# Patient Record
Sex: Female | Born: 1955 | Race: Black or African American | Hispanic: No | Smoking: Former smoker
Health system: Southern US, Community
[De-identification: ages and names within clinical notes are randomized; demographics above are authoritative.]

## PROBLEM LIST (undated history)

## (undated) DIAGNOSIS — R918 Other nonspecific abnormal finding of lung field: Secondary | ICD-10-CM

## (undated) DIAGNOSIS — G8929 Other chronic pain: Secondary | ICD-10-CM

## (undated) DIAGNOSIS — I10 Essential (primary) hypertension: Secondary | ICD-10-CM

## (undated) DIAGNOSIS — M549 Dorsalgia, unspecified: Secondary | ICD-10-CM

## (undated) DIAGNOSIS — J449 Chronic obstructive pulmonary disease, unspecified: Secondary | ICD-10-CM

## (undated) DIAGNOSIS — M419 Scoliosis, unspecified: Secondary | ICD-10-CM

## (undated) DIAGNOSIS — J40 Bronchitis, not specified as acute or chronic: Secondary | ICD-10-CM

## (undated) DIAGNOSIS — I4891 Unspecified atrial fibrillation: Secondary | ICD-10-CM

## (undated) DIAGNOSIS — C3490 Malignant neoplasm of unspecified part of unspecified bronchus or lung: Secondary | ICD-10-CM

---

## 2000-05-07 ENCOUNTER — Encounter: Payer: Self-pay | Admitting: Emergency Medicine

## 2000-05-07 ENCOUNTER — Emergency Department (HOSPITAL_COMMUNITY): Admission: EM | Admit: 2000-05-07 | Discharge: 2000-05-07 | Payer: Self-pay | Admitting: Emergency Medicine

## 2002-09-10 ENCOUNTER — Emergency Department (HOSPITAL_COMMUNITY): Admission: EM | Admit: 2002-09-10 | Discharge: 2002-09-10 | Payer: Self-pay | Admitting: Emergency Medicine

## 2005-06-17 ENCOUNTER — Emergency Department (HOSPITAL_COMMUNITY): Admission: EM | Admit: 2005-06-17 | Discharge: 2005-06-17 | Payer: Self-pay | Admitting: Emergency Medicine

## 2007-05-24 ENCOUNTER — Emergency Department (HOSPITAL_COMMUNITY): Admission: EM | Admit: 2007-05-24 | Discharge: 2007-05-24 | Payer: Self-pay | Admitting: Emergency Medicine

## 2012-02-27 ENCOUNTER — Encounter (HOSPITAL_COMMUNITY): Payer: Self-pay | Admitting: Emergency Medicine

## 2012-02-27 ENCOUNTER — Emergency Department (HOSPITAL_COMMUNITY)
Admission: EM | Admit: 2012-02-27 | Discharge: 2012-02-27 | Disposition: A | Discharge status: Home Health | Payer: Self-pay | Attending: Emergency Medicine | Admitting: Emergency Medicine

## 2012-02-27 DIAGNOSIS — M4180 Other forms of scoliosis, site unspecified: Secondary | ICD-10-CM | POA: Insufficient documentation

## 2012-02-27 DIAGNOSIS — G8929 Other chronic pain: Secondary | ICD-10-CM | POA: Insufficient documentation

## 2012-02-27 DIAGNOSIS — M549 Dorsalgia, unspecified: Secondary | ICD-10-CM | POA: Insufficient documentation

## 2012-02-27 DIAGNOSIS — M419 Scoliosis, unspecified: Secondary | ICD-10-CM

## 2012-02-27 HISTORY — DX: Dorsalgia, unspecified: M54.9

## 2012-02-27 HISTORY — DX: Other chronic pain: G89.29

## 2012-02-27 MED ORDER — HYDROCODONE-ACETAMINOPHEN 5-325 MG PO TABS: 1.0000 | ORAL_TABLET | ORAL | Status: DC | PRN

## 2012-02-27 NOTE — ED Notes (Signed)
Per EMS: pt c/o lower back pain chronic in nature that is worse today; pt denies obvious injury
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
## 2012-02-27 NOTE — Discharge Instructions (Signed)
Back Exercises Back exercises help treat and prevent back injuries. The goal of back exercises is to increase the strength of your abdominal and back muscles and the flexibility of your back. These exercises should be started when you no longer have back pain. Back exercises include:  Pelvic Tilt. Lie on your back with your knees bent. Tilt your pelvis until the lower part of your back is against the floor. Hold this position 5 to 10 sec and repeat 5 to 10 times.  Knee to Chest. Pull first 1 knee up against your chest and hold for 20 to 30 seconds, repeat this with the other knee, and then both knees. This may be done with the other leg straight or bent, whichever feels better.  Sit-Ups or Curl-Ups. Bend your knees 90 degrees. Start with tilting your pelvis, and do a partial, slow sit-up, lifting your trunk only 30 to 45 degrees off the floor. Take at least 2 to 3 seconds for each sit-up. Do not do sit-ups with your knees out straight. If partial sit-ups are difficult, simply do the above but with only tightening your abdominal muscles and holding it as directed.  Hip-Lift. Lie on your back with your knees flexed 90 degrees. Push down with your feet and shoulders as you raise your hips a couple inches off the floor; hold for 10 seconds, repeat 5 to 10 times.  Back arches. Lie on your stomach, propping yourself up on bent elbows. Slowly press on your hands, causing an arch in your low back. Repeat 3 to 5 times. Any initial stiffness and discomfort should lessen with repetition over time.  Shoulder-Lifts. Lie face down with arms beside your body. Keep hips and torso pressed to floor as you slowly lift your head and shoulders off the floor. Do not overdo your exercises, especially in the beginning. Exercises may cause you some mild back discomfort which lasts for a few minutes; however, if the pain is more severe, or lasts for more than 15 minutes, do not continue exercises until you see your caregiver.  Improvement with exercise therapy for back problems is slow.  See your caregivers for assistance with developing a proper back exercise program. Document Released: 06/13/2004 Document Revised: 07/29/2011 Document Reviewed: 03/07/2011 [facility] Patient Information 2013 [place], [place]. Back Pain, Adult Back pain is very common. The pain often gets better over time. The cause of back pain is usually not dangerous. Most people can learn to manage their back pain on their own.  HOME CARE   Stay active. Start with short walks on flat ground if you can. Try to walk farther each day.  Do not sit, drive, or stand in one place for more than 30 minutes. Do not stay in bed.  Do not avoid exercise or work. Activity can help your back heal faster.  Be careful when you bend or lift an object. Bend at your knees, keep the object close to you, and do not twist.  Sleep on a firm mattress. Lie on your side, and bend your knees. If you lie on your back, put a pillow under your knees.  Only take medicines as told by your doctor.  Put ice on the injured area.  Put ice in a plastic bag.  Place a towel between your skin and the bag.  Leave the ice on for 15 to 20 minutes, 3 to 4 times a day for the first 2 to 3 days. After that, you can switch between ice and heat packs.  Ask your doctor about back exercises or massage.  Avoid feeling anxious or stressed. Find good ways to deal with stress, such as exercise. GET HELP RIGHT AWAY IF:   Your pain does not go away with rest or medicine.  Your pain does not go away in 1 week.  You have new problems.  You do not feel well.  The pain spreads into your legs.  You cannot control when you poop (bowel movement) or pee (urinate).  Your arms or legs feel weak or lose feeling (numbness).  You feel sick to your stomach (nauseous) or throw up (vomit).  You have belly (abdominal) pain.  You feel like you may pass out (faint). MAKE SURE YOU:   Understand  these instructions.  Will watch your condition.  Will get help right away if you are not doing well or get worse. Document Released: 10/23/2007 Document Revised: 07/29/2011 Document Reviewed: 09/24/2010 [facility] Patient Information 2013 [place], [place].

## 2012-11-22 ENCOUNTER — Emergency Department (HOSPITAL_COMMUNITY)
Admission: EM | Admit: 2012-11-22 | Discharge: 2012-11-22 | Disposition: A | Discharge status: Home / Self Care | Payer: Self-pay | Attending: Emergency Medicine | Admitting: Emergency Medicine

## 2012-11-22 DIAGNOSIS — M79609 Pain in unspecified limb: Secondary | ICD-10-CM | POA: Insufficient documentation

## 2012-11-22 DIAGNOSIS — M5432 Sciatica, left side: Secondary | ICD-10-CM

## 2012-11-22 DIAGNOSIS — M5430 Sciatica, unspecified side: Secondary | ICD-10-CM | POA: Insufficient documentation

## 2012-11-22 DIAGNOSIS — G8929 Other chronic pain: Secondary | ICD-10-CM | POA: Insufficient documentation

## 2012-11-22 DIAGNOSIS — M25559 Pain in unspecified hip: Secondary | ICD-10-CM | POA: Insufficient documentation

## 2012-11-22 MED ORDER — OXYCODONE-ACETAMINOPHEN 5-325 MG PO TABS
1.0000 | ORAL_TABLET | Freq: Once | ORAL | Status: AC
  Administered 2012-11-22: 1 via ORAL
  Filled 2012-11-22: qty 1

## 2012-11-22 MED ORDER — PREDNISONE 20 MG PO TABS
60.0000 mg | ORAL_TABLET | Freq: Once | ORAL | Status: AC
  Administered 2012-11-22: 60 mg via ORAL
  Filled 2012-11-22: qty 3

## 2012-11-22 MED ORDER — HYDROCODONE-ACETAMINOPHEN 5-325 MG PO TABS: 1.0000 | ORAL_TABLET | Freq: Four times a day (QID) | ORAL | Status: DC | PRN

## 2012-11-22 MED ORDER — PREDNISONE 10 MG PO TABS: 20.0000 mg | ORAL_TABLET | Freq: Every day | ORAL | Status: DC

## 2012-11-22 NOTE — ED Provider Notes (Signed)
Medical screening examination/treatment/procedure(s) were performed by non-physician practitioner and as supervising physician I was immediately available for consultation/collaboration.  Ethelda Chick, MD 11/22/12 2215

## 2012-11-22 NOTE — ED Notes (Signed)
Pt has a ride home.  

## 2012-11-22 NOTE — ED Notes (Signed)
Pt BIB EMS. Pt c/o pain in lower back that radiates down to feet. Pt states she has pain when standing and walking and symptoms are worse at night. Pt describes pain in feet as like pins and needles. Pt denies injury to feet or back. Pt ambulatory to exam room from ambulance.

## 2012-11-22 NOTE — ED Provider Notes (Signed)
History  This chart was scribed for [facility] Brittanee Ghazarian - PA by Manuela Schwartz, ED scribe. This patient was seen in room WTR5/WTR5 and the patient's care was started at 2107.  CSN: [id number] Arrival date & time 11/22/12  2056  First MD Initiated Contact with Patient 11/22/12 2107     Chief Complaint  Patient presents with  . Back Pain  . Foot Pain   Patient is a 57 y.o. female presenting with back pain and lower extremity pain. The history is provided by the patient. No language interpreter was used.  Back Pain Location:  Lumbar spine Quality:  Aching Radiates to:  R posterior upper leg and R thigh Pain severity:  Moderate Pain is:  Same all the time Onset quality:  Gradual Timing:  Constant Progression:  Unchanged Chronicity:  Recurrent Context: not falling, not recent illness and not recent injury   Relieved by:  Nothing Worsened by:  Ambulation Ineffective treatments:  None tried Associated symptoms: no abdominal pain, no bladder incontinence, no bowel incontinence, no chest pain, no fever and no weakness   Foot Pain Pertinent negatives include no chest pain, no abdominal pain and no shortness of breath.  Foot Pain Pertinent negatives include no abdominal pain, chest pain, chills, congestion, coughing, fever, nausea, vomiting or weakness.   HPI Comments: Sonya [name] is a 57 y.o. female who presents to the Emergency Department complaining of constant lower lumbar back pain which radiates to her left leg. She states has previously been to the ER for previous similar episodes and referred to specialist but never went. She states was previously tx w/pain medicine which she states temporarily improved her back pain. She denies any recent injuries or trauma. She states her back pain radiates down her entire left thigh/leg. She states her sx are worse w/ambulation. She denies fever/chills, incontinence. She has no medical hx.  She has no PCP  Past Medical History  Diagnosis Date   . Chronic back pain    No past surgical history on file. No family history on file. History  Substance Use Topics  . Smoking status: Not on file  . Smokeless tobacco: Not on file  . Alcohol Use:    OB History   Grav Para Term Preterm Abortions TAB SAB Ect Mult Living                 Review of Systems  Constitutional: Negative for fever and chills.  HENT: Negative for congestion and rhinorrhea.   Respiratory: Negative for cough and shortness of breath.   Cardiovascular: Negative for chest pain.  Gastrointestinal: Negative for nausea, vomiting, abdominal pain and bowel incontinence.  Genitourinary: Negative for bladder incontinence.  Musculoskeletal: Positive for back pain (lumbar back pain).  Skin: Negative for color change and pallor.  Neurological: Negative for weakness.  All other systems reviewed and are negative.   A complete 10 system review of systems was obtained and all systems are negative except as noted in the HPI and PMH.   Allergies  Review of patient's allergies indicates no known allergies.  Home Medications   Current Outpatient Rx  Name  Route  Sig  Dispense  Refill  . HYDROcodone-acetaminophen (NORCO/VICODIN) 5-325 MG per tablet   Oral   Take 1 tablet by mouth every 4 (four) hours as needed for pain.   30 tablet   0   . HYDROcodone-acetaminophen (NORCO/VICODIN) 5-325 MG per tablet   Oral   Take 1-2 tablets by mouth every 6 (six) hours as needed  for pain.   20 tablet   0   . ibuprofen (ADVIL,MOTRIN) 200 MG tablet   Oral   Take 200 mg by mouth every 8 (eight) hours as needed. For pain         . predniSONE (DELTASONE) 10 MG tablet   Oral   Take 2 tablets (20 mg total) by mouth daily.   21 tablet   0     Prednisone dose pack directions:   6 tabs on day ...    Triage Vitals: BP 183/88  Pulse 71  Temp(Src) 98.3 F (36.8 C)  Resp 16  SpO2 100% Physical Exam  Nursing note and vitals reviewed. Constitutional: She is oriented to  person, place, and time. She appears well-developed and well-nourished. No distress.  HENT:  Head: Normocephalic and atraumatic.  Eyes: EOM are normal.  Neck: Neck supple. No tracheal deviation present.  Cardiovascular: Normal rate.   Pulmonary/Chest: Effort normal. No respiratory distress.  Musculoskeletal:       Left hip: She exhibits decreased range of motion, decreased strength and tenderness. She exhibits no bony tenderness, no swelling, no crepitus, no deformity and no laceration.       Legs:  Equal strength to bilateral lower extremities. Neurosensory function adequate to both legs. Skin color is normal. Skin is warm and moist. I see no step off deformity, no bony tenderness. Pt is able to ambulate without limp. Pain is relieved when sitting in certain positions. ROM is decreased due to pain. No crepitus, laceration, effusion, swelling.  Pulses are normal   Neurological: She is alert and oriented to person, place, and time.  Skin: Skin is warm and dry.  Psychiatric: She has a normal mood and affect. Her behavior is normal.    ED Course  Procedures (including critical care time) DIAGNOSTIC STUDIES: Oxygen Saturation is 100% on room air, normal by my interpretation.    COORDINATION OF CARE: At 950 PM Discussed treatment plan with patient which includes pain medicine. Patient agrees.   Labs Reviewed - No data to display No results found. 1. Sciatica of left side     MDM  .Patient with back pain. No neurological deficits. Patient is ambulatory. No warning symptoms of back pain including: loss of bowel or bladder control, night sweats, waking from sleep with back pain, unexplained fevers or weight loss, h/o cancer, IVDU, recent significant  trauma. No concern for cauda equina, epidural abscess, or other serious cause of back pain. Conservative measures such as rest, ice/heat, prednisone and pain medicine indicated with PCP follow-up if no improvement with conservative management.       Dorthula Matas, PA-C 11/22/12 2211

## 2015-06-13 ENCOUNTER — Emergency Department (HOSPITAL_COMMUNITY): Payer: Self-pay

## 2015-06-13 ENCOUNTER — Emergency Department (HOSPITAL_COMMUNITY)
Admission: EM | Admit: 2015-06-13 | Discharge: 2015-06-13 | Disposition: A | Discharge status: Home / Self Care | Payer: Self-pay | Attending: Emergency Medicine | Admitting: Emergency Medicine

## 2015-06-13 ENCOUNTER — Encounter (HOSPITAL_COMMUNITY): Payer: Self-pay

## 2015-06-13 DIAGNOSIS — Z79899 Other long term (current) drug therapy: Secondary | ICD-10-CM | POA: Insufficient documentation

## 2015-06-13 DIAGNOSIS — G8929 Other chronic pain: Secondary | ICD-10-CM | POA: Insufficient documentation

## 2015-06-13 DIAGNOSIS — I10 Essential (primary) hypertension: Secondary | ICD-10-CM | POA: Insufficient documentation

## 2015-06-13 DIAGNOSIS — Z7952 Long term (current) use of systemic steroids: Secondary | ICD-10-CM | POA: Insufficient documentation

## 2015-06-13 DIAGNOSIS — F17200 Nicotine dependence, unspecified, uncomplicated: Secondary | ICD-10-CM | POA: Insufficient documentation

## 2015-06-13 LAB — COMPREHENSIVE METABOLIC PANEL
ALBUMIN: 3.9 g/dL (ref 3.5–5.0)
ALT: 14 U/L (ref 14–54)
ANION GAP: 11 (ref 5–15)
AST: 22 U/L (ref 15–41)
Alkaline Phosphatase: 83 U/L (ref 38–126)
BILIRUBIN TOTAL: 0.4 mg/dL (ref 0.3–1.2)
BUN: 12 mg/dL (ref 6–20)
CO2: 25 mmol/L (ref 22–32)
Calcium: 9.9 mg/dL (ref 8.9–10.3)
Chloride: 102 mmol/L (ref 101–111)
Creatinine, Ser: 0.8 mg/dL (ref 0.44–1.00)
GLUCOSE: 108 mg/dL — AB (ref 65–99)
POTASSIUM: 4.5 mmol/L (ref 3.5–5.1)
Sodium: 138 mmol/L (ref 135–145)
TOTAL PROTEIN: 8.2 g/dL — AB (ref 6.5–8.1)

## 2015-06-13 LAB — I-STAT CHEM 8, ED
BUN: 15 mg/dL (ref 6–20)
Calcium, Ion: 1.23 mmol/L (ref 1.12–1.23)
Chloride: 102 mmol/L (ref 101–111)
Creatinine, Ser: 0.7 mg/dL (ref 0.44–1.00)
Glucose, Bld: 105 mg/dL — ABNORMAL HIGH (ref 65–99)
HEMATOCRIT: 41 % (ref 36.0–46.0)
HEMOGLOBIN: 13.9 g/dL (ref 12.0–15.0)
Potassium: 4.9 mmol/L (ref 3.5–5.1)
SODIUM: 138 mmol/L (ref 135–145)
TCO2: 26 mmol/L (ref 0–100)

## 2015-06-13 LAB — APTT: aPTT: 27 seconds (ref 24–37)

## 2015-06-13 LAB — DIFFERENTIAL
Basophils Absolute: 0 10*3/uL (ref 0.0–0.1)
Basophils Relative: 1 %
EOS ABS: 0.2 10*3/uL (ref 0.0–0.7)
EOS PCT: 3 %
LYMPHS ABS: 1.9 10*3/uL (ref 0.7–4.0)
Lymphocytes Relative: 38 %
Monocytes Absolute: 0.4 10*3/uL (ref 0.1–1.0)
Monocytes Relative: 8 %
NEUTROS PCT: 50 %
Neutro Abs: 2.4 10*3/uL (ref 1.7–7.7)

## 2015-06-13 LAB — CBC
HCT: 36 % (ref 36.0–46.0)
HEMOGLOBIN: 12.4 g/dL (ref 12.0–15.0)
MCH: 33.2 pg (ref 26.0–34.0)
MCHC: 34.4 g/dL (ref 30.0–36.0)
MCV: 96.3 fL (ref 78.0–100.0)
Platelets: 287 10*3/uL (ref 150–400)
RBC: 3.74 MIL/uL — ABNORMAL LOW (ref 3.87–5.11)
RDW: 14.5 % (ref 11.5–15.5)
WBC: 4.9 10*3/uL (ref 4.0–10.5)

## 2015-06-13 LAB — I-STAT TROPONIN, ED: TROPONIN I, POC: 0.01 ng/mL (ref 0.00–0.08)

## 2015-06-13 LAB — PROTIME-INR
INR: 1.09 (ref 0.00–1.49)
PROTHROMBIN TIME: 14.3 s (ref 11.6–15.2)

## 2015-06-13 NOTE — ED Provider Notes (Signed)
CSN: [id number]     Arrival date & time 06/13/15  1430 History   First MD Initiated Contact with Patient 06/13/15 2053     Chief Complaint  Patient presents with  . Dizziness  . Hypertension     (Consider location/radiation/quality/duration/timing/severity/associated sxs/prior Treatment) HPI Sonya [name] Is a 60 year old female who presents emergency Department with chief complaint of hypertension. She states she has no previous history of hypertension. She is a half pack smoker a day. She states that she goes to [facility] for sleep medications. 2 days ago she noticed that she felt "swimmy headed all day." When she went to [facility] yesterday. She was told that her blood pressure was extremely high and needs to be evaluated. Upon arrival in the emergency department. Her blood pressure was found to be 207/94 and has since calmed down on its own to 189/86. The patient denies chest pain, shortness of breath, vertigo, unilateral weakness, difficulty with speech or swallowing. She denies drug abuse. Past Medical History  Diagnosis Date  . Chronic back pain    No past surgical history on file. No family history on file. Social History  Substance Use Topics  . Smoking status: Current Every Day Smoker -- 0.25 packs/day  . Smokeless tobacco: Not on file  . Alcohol Use: Yes     Comment: occ    OB History    No data available     Review of Systems  Ten systems reviewed and are negative for acute change, except as noted in the HPI.    Allergies  Review of patient's allergies indicates no known allergies.  Home Medications   Prior to Admission medications   Medication Sig Start Date End Date Taking? Authorizing Provider  Brexpiprazole (REXULTI) 0.5 MG TABS Take 1 tablet by mouth.   Yes Historical Provider, MD  ibuprofen (ADVIL,MOTRIN) 200 MG tablet Take 200 mg by mouth every 8 (eight) hours as needed. For pain   Yes Historical Provider, MD  HYDROcodone-acetaminophen  (NORCO/VICODIN) 5-325 MG per tablet Take 1 tablet by mouth every 4 (four) hours as needed for pain. 02/27/12   Daleen Bo, MD  HYDROcodone-acetaminophen (NORCO/VICODIN) 5-325 MG per tablet Take 1-2 tablets by mouth every 6 (six) hours as needed for pain. 11/22/12   Tiffany Carlota Raspberry, PA-C  predniSONE (DELTASONE) 10 MG tablet Take 2 tablets (20 mg total) by mouth daily. 11/22/12   Tiffany Carlota Raspberry, PA-C   BP 189/86 mmHg  Pulse 81  Temp(Src) 98.2 F (36.8 C) (Oral)  Resp 16  SpO2 100% Physical Exam  Constitutional: She is oriented to person, place, and time. She appears well-developed and well-nourished. No distress.  HENT:  Head: Normocephalic and atraumatic.  Eyes: Conjunctivae are normal. No scleral icterus.  Neck: Normal range of motion.  Cardiovascular: Normal rate, regular rhythm and normal heart sounds.  Exam reveals no gallop and no friction rub.   No murmur heard. Pulmonary/Chest: Effort normal and breath sounds normal. No respiratory distress.  Abdominal: Soft. Bowel sounds are normal. She exhibits no distension and no mass. There is no tenderness. There is no guarding.  Neurological: She is alert and oriented to person, place, and time.  Speech is clear and goal oriented, follows commands Major Cranial nerves without deficit, no facial droop Normal strength in upper and lower extremities bilaterally including dorsiflexion and plantar flexion, strong and equal grip strength Sensation normal to light and sharp touch Moves extremities without ataxia, coordination intact Normal finger to nose and rapid alternating movements Neg romberg, no pronator  drift Normal gait Normal heel-shin and balance   Skin: Skin is warm and dry. She is not diaphoretic.    ED Course  Procedures (including critical care time) Labs Review Labs Reviewed  CBC - Abnormal; Notable for the following:    RBC 3.74 (*)    All other components within normal limits  COMPREHENSIVE METABOLIC PANEL - Abnormal;  Notable for the following:    Glucose, Bld 108 (*)    Total Protein 8.2 (*)    All other components within normal limits  I-STAT CHEM 8, ED - Abnormal; Notable for the following:    Glucose, Bld 105 (*)    All other components within normal limits  PROTIME-INR  APTT  DIFFERENTIAL  I-STAT TROPOININ, ED  CBG MONITORING, ED    Imaging Review Ct Head Wo Contrast  06/13/2015  CLINICAL DATA:  Dizziness, elevated blood pressure EXAM: CT HEAD WITHOUT CONTRAST TECHNIQUE: Contiguous axial images were obtained from the base of the skull through the vertex without intravenous contrast. COMPARISON:  None. FINDINGS: No skull fracture is noted. Paranasal sinuses and mastoid air cells are unremarkable. No intracranial hemorrhage, mass effect or midline shift No acute cortical infarction. No mass lesion is noted on this unenhanced scan. The gray and white-matter differentiation is preserved. IMPRESSION: No acute intracranial abnormality. Electronically Signed   By: Lahoma Crocker M.D.   On: 06/13/2015 16:48   I have personally reviewed and evaluated these images and lab results as part of my medical decision-making.   EKG Interpretation None      MDM   Final diagnoses:  Essential hypertension    Patient noted to be hypertensive in the emergency department.  No signs of hypertensive urgency.  Discussed with patient the need for close follow-up and management by their primary care physician.  Given appt with [facility].    Margarita Mail, PA-C 06/13/15 2146  Charlesetta Shanks, MD 06/14/15 [phone]

## 2015-06-13 NOTE — ED Notes (Signed)
Pt reports BP elevated, was seen at [facility] yesterday who advised pt to come to ED for BP medication.   Not diagnosed BP or been on any BP meds.  Pt having dizziness.  No other s/s noted.  NAD at triage/

## 2015-06-13 NOTE — Discharge Instructions (Signed)
Hypertension °Hypertension, commonly called high blood pressure, is when the force of blood pumping through your arteries is too strong. Your arteries are the blood vessels that carry blood from your heart throughout your body. A blood pressure reading consists of a higher number over a lower number, such as 110/72. The higher number (systolic) is the pressure inside your arteries when your heart pumps. The lower number (diastolic) is the pressure inside your arteries when your heart relaxes. Ideally you want your blood pressure below 120/80. °Hypertension forces your heart to work harder to pump blood. Your arteries may become narrow or stiff. Having untreated or uncontrolled hypertension can cause heart attack, stroke, kidney disease, and other problems. °RISK FACTORS °Some risk factors for high blood pressure are controllable. Others are not.  °Risk factors you cannot control include:  °· Race. You may be at higher risk if you are African American. °· Age. Risk increases with age. °· Gender. Men are at higher risk than women before age 45 years. After age 65, women are at higher risk than men. °Risk factors you can control include: °· Not getting enough exercise or physical activity. °· Being overweight. °· Getting too much fat, sugar, calories, or salt in your diet. °· Drinking too much alcohol. °SIGNS AND SYMPTOMS °Hypertension does not usually cause signs or symptoms. Extremely high blood pressure (hypertensive crisis) may cause headache, anxiety, shortness of breath, and nosebleed. °DIAGNOSIS °To check if you have hypertension, your health care provider will measure your blood pressure while you are seated, with your arm held at the level of your heart. It should be measured at least twice using the same arm. Certain conditions can cause a difference in blood pressure between your right and left arms. A blood pressure reading that is higher than normal on one occasion does not mean that you need treatment. If  it is not clear whether you have high blood pressure, you may be asked to return on a different day to have your blood pressure checked again. Or, you may be asked to monitor your blood pressure at home for 1 or more weeks. °TREATMENT °Treating high blood pressure includes making lifestyle changes and possibly taking medicine. Living a healthy lifestyle can help lower high blood pressure. You may need to change some of your habits. °Lifestyle changes may include: °· Following the DASH diet. This diet is high in fruits, vegetables, and whole grains. It is low in salt, red meat, and added sugars. °· Keep your sodium intake below 2,300 mg per day. °· Getting at least 30-45 minutes of aerobic exercise at least 4 times per week. °· Losing weight if necessary. °· Not smoking. °· Limiting alcoholic beverages. °· Learning ways to reduce stress. °Your health care provider may prescribe medicine if lifestyle changes are not enough to get your blood pressure under control, and if one of the following is true: °· You are 18-59 years of age and your systolic blood pressure is above 140. °· You are 60 years of age or older, and your systolic blood pressure is above 150. °· Your diastolic blood pressure is above 90. °· You have diabetes, and your systolic blood pressure is over 140 or your diastolic blood pressure is over 90. °· You have kidney disease and your blood pressure is above 140/90. °· You have heart disease and your blood pressure is above 140/90. °Your personal target blood pressure may vary depending on your medical conditions, your age, and other factors. °HOME CARE INSTRUCTIONS °·   Have your blood pressure rechecked as directed by your health care provider.   Take medicines only as directed by your health care provider. Follow the directions carefully. Blood pressure medicines must be taken as prescribed. The medicine does not work as well when you skip doses. Skipping doses also puts you at risk for  problems.  Do not smoke.   Monitor your blood pressure at home as directed by your health care provider. SEEK MEDICAL CARE IF:   You think you are having a reaction to medicines taken.  You have recurrent headaches or feel dizzy.  You have swelling in your ankles.  You have trouble with your vision. SEEK IMMEDIATE MEDICAL CARE IF:  You develop a severe headache or confusion.  You have unusual weakness, numbness, or feel faint.  You have severe chest or abdominal pain.  You vomit repeatedly.  You have trouble breathing. MAKE SURE YOU:   Understand these instructions.  Will watch your condition.  Will get help right away if you are not doing well or get worse.   This information is not intended to replace advice given to you by your health care provider. Make sure you discuss any questions you have with your health care provider.   Document Released: 05/06/2005 Document Revised: 09/20/2014 Document Reviewed: 02/26/2013 Elsevier Interactive Patient Education 2016 [place] DASH stands for "Dietary Approaches to Stop Hypertension." The DASH eating plan is a healthy eating plan that has been shown to reduce high blood pressure (hypertension). Additional health benefits may include reducing the risk of type 2 diabetes mellitus, heart disease, and stroke. The DASH eating plan may also help with weight loss. WHAT DO I NEED TO KNOW ABOUT THE DASH EATING PLAN? For the DASH eating plan, you will follow these general guidelines:  Choose foods with a percent daily value for sodium of less than 5% (as listed on the food label).  Use salt-free seasonings or herbs instead of table salt or sea salt.  Check with your health care provider or pharmacist before using salt substitutes.  Eat lower-sodium products, often labeled as "lower sodium" or "no salt added."  Eat fresh foods.  Eat more vegetables, fruits, and low-fat dairy products.  Choose whole  grains. Look for the word "whole" as the first word in the ingredient list.  Choose fish and skinless chicken or [place] more often than red meat. Limit fish, poultry, and meat to 6 oz (170 g) each day.  Limit sweets, desserts, sugars, and sugary drinks.  Choose heart-healthy fats.  Limit cheese to 1 oz (28 g) per day.  Eat more home-cooked food and less restaurant, buffet, and fast food.  Limit fried foods.  Cook foods using methods other than frying.  Limit canned vegetables. If you do use them, rinse them well to decrease the sodium.  When eating at a restaurant, ask that your food be prepared with less salt, or no salt if possible. WHAT FOODS CAN I EAT? Seek help from a dietitian for individual calorie needs. Grains Whole grain or whole wheat bread. Brown rice. Whole grain or whole wheat pasta. Quinoa, bulgur, and whole grain cereals. Low-sodium cereals. Corn or whole wheat flour tortillas. Whole grain cornbread. Whole grain crackers. Low-sodium crackers. Vegetables Fresh or frozen vegetables (raw, steamed, roasted, or grilled). Low-sodium or reduced-sodium tomato and vegetable juices. Low-sodium or reduced-sodium tomato sauce and paste. Low-sodium or reduced-sodium canned vegetables.  Fruits All fresh, canned (in natural juice), or frozen fruits. Meat and  Other Protein Products Ground beef (85% or leaner), grass-fed beef, or beef trimmed of fat. Skinless chicken or [place]. Ground chicken or [place]. Pork trimmed of fat. All fish and seafood. Eggs. Dried beans, peas, or lentils. Unsalted nuts and seeds. Unsalted canned beans. Dairy Low-fat dairy products, such as skim or 1% milk, 2% or reduced-fat cheeses, low-fat ricotta or cottage cheese, or plain low-fat yogurt. Low-sodium or reduced-sodium cheeses. Fats and Oils Tub margarines without trans fats. Light or reduced-fat mayonnaise and salad dressings (reduced sodium). Avocado. Safflower, olive, or canola oils. Natural peanut or  almond butter. Other Unsalted popcorn and pretzels. The items listed above may not be a complete list of recommended foods or beverages. Contact your dietitian for more options. WHAT FOODS ARE NOT RECOMMENDED? Grains White bread. White pasta. White rice. Refined cornbread. Bagels and croissants. Crackers that contain trans fat. Vegetables Creamed or fried vegetables. Vegetables in a cheese sauce. Regular canned vegetables. Regular canned tomato sauce and paste. Regular tomato and vegetable juices. Fruits Dried fruits. Canned fruit in light or heavy syrup. Fruit juice. Meat and Other Protein Products Fatty cuts of meat. Ribs, chicken wings, bacon, sausage, bologna, salami, chitterlings, fatback, hot dogs, bratwurst, and packaged luncheon meats. Salted nuts and seeds. Canned beans with salt. Dairy Whole or 2% milk, cream, half-and-half, and cream cheese. Whole-fat or sweetened yogurt. Full-fat cheeses or blue cheese. Nondairy creamers and whipped toppings. Processed cheese, cheese spreads, or cheese curds. Condiments Onion and garlic salt, seasoned salt, table salt, and sea salt. Canned and packaged gravies. Worcestershire sauce. Tartar sauce. Barbecue sauce. Teriyaki sauce. Soy sauce, including reduced sodium. Steak sauce. Fish sauce. Oyster sauce. Cocktail sauce. Horseradish. Ketchup and mustard. Meat flavorings and tenderizers. Bouillon cubes. Hot sauce. Tabasco sauce. Marinades. Taco seasonings. Relishes. Fats and Oils Butter, stick margarine, lard, shortening, ghee, and bacon fat. Coconut, palm kernel, or palm oils. Regular salad dressings. Other Pickles and olives. Salted popcorn and pretzels. The items listed above may not be a complete list of foods and beverages to avoid. Contact your dietitian for more information. WHERE CAN I FIND MORE INFORMATION? National Heart, Lung, and Blood Institute: travelstabloid.com   This information is not intended to  replace advice given to you by your health care provider. Make sure you discuss any questions you have with your health care provider.   Document Released: 04/25/2011 Document Revised: 05/27/2014 Document Reviewed: 03/10/2013 Elsevier Interactive Patient Education [employer].

## 2015-06-13 NOTE — Care Management (Signed)
Patient presents to [facility] ED sent from [facility] with elevated B/P. Patient without health insurance or a PCP. On ED evaluation patient pressure was confirmed elevated, patient needing OP follow-up, CM arranged for follow- up at the [facility] for Monday 1/30 at 3:45p with Dr. Jarold Song.  Patient made aware of follow up appointment, and information placed on AVS. No further ED CM needs identified.

## 2015-06-13 NOTE — ED Notes (Signed)
Patient verbalized understanding of discharge instructions and denies any further needs or questions at this time. VS stable. Patient ambulatory with steady gait.  

## 2015-06-19 ENCOUNTER — Inpatient Hospital Stay: Payer: Self-pay | Admitting: Family Medicine

## 2015-06-20 ENCOUNTER — Encounter: Payer: Self-pay | Admitting: Family Medicine

## 2015-06-20 ENCOUNTER — Ambulatory Visit: Payer: Self-pay | Attending: Family Medicine | Admitting: Family Medicine

## 2015-06-20 VITALS — BP 196/87 | HR 78 | Temp 97.9°F | Resp 16 | Ht 69.0 in | Wt 128.0 lb

## 2015-06-20 DIAGNOSIS — F17210 Nicotine dependence, cigarettes, uncomplicated: Secondary | ICD-10-CM | POA: Insufficient documentation

## 2015-06-20 DIAGNOSIS — Z720 Tobacco use: Secondary | ICD-10-CM

## 2015-06-20 DIAGNOSIS — I10 Essential (primary) hypertension: Secondary | ICD-10-CM | POA: Insufficient documentation

## 2015-06-20 DIAGNOSIS — Z79899 Other long term (current) drug therapy: Secondary | ICD-10-CM | POA: Insufficient documentation

## 2015-06-20 DIAGNOSIS — R42 Dizziness and giddiness: Secondary | ICD-10-CM | POA: Insufficient documentation

## 2015-06-20 MED ORDER — CLONIDINE HCL 0.1 MG PO TABS
0.2000 mg | ORAL_TABLET | Freq: Once | ORAL | Status: AC
  Administered 2015-06-20: 0.2 mg via ORAL

## 2015-06-20 MED ORDER — LISINOPRIL-HYDROCHLOROTHIAZIDE 20-25 MG PO TABS: 1.0000 | ORAL_TABLET | Freq: Every day | ORAL | Status: DC

## 2015-06-20 MED FILL — LISINOPRIL-HCTZ 20-25 MG TA: 20-25 | 30 days supply | Qty: 30 | Fill #0

## 2015-06-20 NOTE — Progress Notes (Signed)
Patient's here for hospital f/up for HTN. Patient reports feeling lightheaded rated at 8/10, since her d/c form the hospital.  Patient declines A1c screening, and flu vaccine.  Patient said that during ED visit her BP was constantly irregular.

## 2015-06-20 NOTE — Patient Instructions (Signed)
Hypertension Hypertension, commonly called high blood pressure, is when the force of blood pumping through your arteries is too strong. Your arteries are the blood vessels that carry blood from your heart throughout your body. A blood pressure reading consists of a higher number over a lower number, such as 110/72. The higher number (systolic) is the pressure inside your arteries when your heart pumps. The lower number (diastolic) is the pressure inside your arteries when your heart relaxes. Ideally you want your blood pressure below 120/80. Hypertension forces your heart to work harder to pump blood. Your arteries may become narrow or stiff. Having untreated or uncontrolled hypertension can cause heart attack, stroke, kidney disease, and other problems. RISK FACTORS Some risk factors for high blood pressure are controllable. Others are not.  Risk factors you cannot control include:   Race. You may be at higher risk if you are African American.  Age. Risk increases with age.  Gender. Men are at higher risk than women before age 45 years. After age 65, women are at higher risk than men. Risk factors you can control include:  Not getting enough exercise or physical activity.  Being overweight.  Getting too much fat, sugar, calories, or salt in your diet.  Drinking too much alcohol. SIGNS AND SYMPTOMS Hypertension does not usually cause signs or symptoms. Extremely high blood pressure (hypertensive crisis) may cause headache, anxiety, shortness of breath, and nosebleed. DIAGNOSIS To check if you have hypertension, your health care provider will measure your blood pressure while you are seated, with your arm held at the level of your heart. It should be measured at least twice using the same arm. Certain conditions can cause a difference in blood pressure between your right and left arms. A blood pressure reading that is higher than normal on one occasion does not mean that you need treatment. If  it is not clear whether you have high blood pressure, you may be asked to return on a different day to have your blood pressure checked again. Or, you may be asked to monitor your blood pressure at home for 1 or more weeks. TREATMENT Treating high blood pressure includes making lifestyle changes and possibly taking medicine. Living a healthy lifestyle can help lower high blood pressure. You may need to change some of your habits. Lifestyle changes may include:  Following the DASH diet. This diet is high in fruits, vegetables, and whole grains. It is low in salt, red meat, and added sugars.  Keep your sodium intake below 2,300 mg per day.  Getting at least 30-45 minutes of aerobic exercise at least 4 times per week.  Losing weight if necessary.  Not smoking.  Limiting alcoholic beverages.  Learning ways to reduce stress. Your health care provider may prescribe medicine if lifestyle changes are not enough to get your blood pressure under control, and if one of the following is true:  You are 18-59 years of age and your systolic blood pressure is above 140.  You are 60 years of age or older, and your systolic blood pressure is above 150.  Your diastolic blood pressure is above 90.  You have diabetes, and your systolic blood pressure is over 140 or your diastolic blood pressure is over 90.  You have kidney disease and your blood pressure is above 140/90.  You have heart disease and your blood pressure is above 140/90. Your personal target blood pressure may vary depending on your medical conditions, your age, and other factors. HOME CARE INSTRUCTIONS    Have your blood pressure rechecked as directed by your health care provider.   Take medicines only as directed by your health care provider. Follow the directions carefully. Blood pressure medicines must be taken as prescribed. The medicine does not work as well when you skip doses. Skipping doses also puts you at risk for  problems.  Do not smoke.   Monitor your blood pressure at home as directed by your health care provider. SEEK MEDICAL CARE IF:   You think you are having a reaction to medicines taken.  You have recurrent headaches or feel dizzy.  You have swelling in your ankles.  You have trouble with your vision. SEEK IMMEDIATE MEDICAL CARE IF:  You develop a severe headache or confusion.  You have unusual weakness, numbness, or feel faint.  You have severe chest or abdominal pain.  You vomit repeatedly.  You have trouble breathing. MAKE SURE YOU:   Understand these instructions.  Will watch your condition.  Will get help right away if you are not doing well or get worse.   This information is not intended to replace advice given to you by your health care provider. Make sure you discuss any questions you have with your health care provider.   Document Released: 05/06/2005 Document Revised: 09/20/2014 Document Reviewed: 02/26/2013 Elsevier Interactive Patient Education 2016 Elsevier Inc.  

## 2015-06-20 NOTE — Progress Notes (Signed)
Subjective:  Patient ID: Sonya [name], female    DOB: 08-02-1955  Age: 60 y.o. MRN: [id number]  CC: Hypertension and Dizziness   HPI Ralphine Hinks presents for  Follow-up on hypertension. She was recently seen at [facility] ED on 06/13/15 and was found to have an elevated blood pressure of  207/95 which later trended down to 189/86; she was discharged without antihypertensives and advised follow-up here in the clinic.  Blood pressure today is 207/87 and the patient admits to feeling lightheaded but denies chest pains, headaches or blurry vision.  She has not been under the care of any  Primary care physician. She goes to [facility] where she receives Rexulti for "sleep" she tells me. She smokes 6-7 sticks of cigarettes per day.  Outpatient Prescriptions Prior to Visit  Medication Sig Dispense Refill  . Brexpiprazole (REXULTI) 0.5 MG TABS Take 1 tablet by mouth.    Marland Kitchen ibuprofen (ADVIL,MOTRIN) 200 MG tablet Take 200 mg by mouth every 8 (eight) hours as needed. For pain    . HYDROcodone-acetaminophen (NORCO/VICODIN) 5-325 MG per tablet Take 1 tablet by mouth every 4 (four) hours as needed for pain. (Patient not taking: Reported on 06/20/2015) 30 tablet 0  . HYDROcodone-acetaminophen (NORCO/VICODIN) 5-325 MG per tablet Take 1-2 tablets by mouth every 6 (six) hours as needed for pain. (Patient not taking: Reported on 06/20/2015) 20 tablet 0  . predniSONE (DELTASONE) 10 MG tablet Take 2 tablets (20 mg total) by mouth daily. (Patient not taking: Reported on 06/20/2015) 21 tablet 0   No facility-administered medications prior to visit.    ROS Review of Systems General: negative for fever, weight loss, appetite change Eyes: no visual symptoms. ENT: no ear symptoms, no sinus tenderness, no nasal congestion or sore throat. Neck: no pain  Respiratory: no wheezing, shortness of breath, cough Cardiovascular: no chest pain, no dyspnea on exertion, no pedal edema, no  orthopnea. Gastrointestinal: no abdominal pain, no diarrhea, no constipation Genito-Urinary: no urinary frequency, no dysuria, no polyuria. Hematologic: no bruising Endocrine: no cold or heat intolerance Neurological: no headaches, no seizures, no tremors,  Positive for lightheadedness Musculoskeletal: no joint pains, no joint swelling Skin: no pruritus, no rash. Psychological: no depression, no anxiety,   Objective: Filed Vitals:   06/20/15 1516 06/20/15 1519  BP: 207/87 196/87  Pulse: 78   Temp: 97.9 F (36.6 C)   TempSrc: Oral   Resp: 16   Height: '5\' 9"'$  (1.753 m)   Weight: 128 lb (58.06 kg)   SpO2: 100%      BP 196/87 mmHg  Pulse 78  Temp(Src) 97.9 F (36.6 C) (Oral)  Resp 16  Ht '5\' 9"'$  (1.753 m)  Wt 128 lb (58.06 kg)  BMI 18.89 kg/m2  SpO2 100%  BP/Weight 06/20/2015 11/03/735 1/0/6269  Systolic BP 485 462 703  Diastolic BP 87 96 88  Wt. (Lbs) 128 - -  BMI 18.89 - -    CMP Latest Ref Rng 06/13/2015 06/13/2015  Glucose 65 - 99 mg/dL 105(H) 108(H)  BUN 6 - 20 mg/dL 15 12  Creatinine 0.44 - 1.00 mg/dL 0.70 0.80  Sodium 135 - 145 mmol/L 138 138  Potassium 3.5 - 5.1 mmol/L 4.9 4.5  Chloride 101 - 111 mmol/L 102 102  CO2 22 - 32 mmol/L - 25  Calcium 8.9 - 10.3 mg/dL - 9.9  Total Protein 6.5 - 8.1 g/dL - 8.2(H)  Total Bilirubin 0.3 - 1.2 mg/dL - 0.4  Alkaline Phos 38 - 126 U/L - 83  AST 15 - 41 U/L - 22  ALT 14 - 54 U/L - 14      Physical Exam Constitutional: normal appearing,  Neck: normal range of motion, no thyromegaly, no JVD Cardiovascular: normal rate and rhythm, normal heart sounds, no murmurs, rub or gallop, no pedal edema Respiratory: clear to auscultation bilaterally, no wheezes, no rales, no rhonchi Abdomen: soft, not tender to palpation, normal bowel sounds, no enlarged organs Extremities: Full ROM, no tenderness in joints Skin: warm and dry, no lesions. Neurological: alert, oriented x3, cranial nerves I-XII grossly intact , normal motor  strength, normal sensation. Psychological: normal mood.   Assessment & Plan:   1. Accelerated hypertension  clonidine given in the clinic and patient observed for 30 minutes prior to discharge.  we'll reassess at next visit for improvement and will add second antihypertensive if needed  -low-sodium, DASH diet. - lisinopril-hydrochlorothiazide (PRINZIDE,ZESTORETIC) 20-25 MG tablet; Take 1 tablet by mouth daily.  Dispense: 30 tablet; Refill: 3 - cloNIDine (CATAPRES) tablet 0.2 mg; Take 2 tablets (0.2 mg total) by mouth once.  2. Tobacco abuse  spent 3 minutes discussing smoking cessation and patient is willing to work on quitting   Meds ordered this encounter  Medications  . lisinopril-hydrochlorothiazide (PRINZIDE,ZESTORETIC) 20-25 MG tablet    Sig: Take 1 tablet by mouth daily.    Dispense:  30 tablet    Refill:  3  . cloNIDine (CATAPRES) tablet 0.2 mg    Sig:     Follow-up: Return in about 1 week (around 06/27/2015) for  follow-up on hypertension.   Arnoldo Morale MD

## 2015-06-30 ENCOUNTER — Ambulatory Visit: Payer: Self-pay | Admitting: Family Medicine

## 2017-12-04 ENCOUNTER — Encounter (HOSPITAL_COMMUNITY): Payer: Self-pay | Admitting: Emergency Medicine

## 2017-12-04 ENCOUNTER — Emergency Department (HOSPITAL_COMMUNITY)
Admission: EM | Admit: 2017-12-04 | Discharge: 2017-12-04 | Disposition: A | Discharge status: Home / Self Care | Payer: Medicaid Other | Attending: Emergency Medicine | Admitting: Emergency Medicine

## 2017-12-04 ENCOUNTER — Emergency Department (HOSPITAL_COMMUNITY): Payer: Medicaid Other

## 2017-12-04 DIAGNOSIS — I10 Essential (primary) hypertension: Secondary | ICD-10-CM | POA: Diagnosis not present

## 2017-12-04 DIAGNOSIS — M419 Scoliosis, unspecified: Secondary | ICD-10-CM | POA: Insufficient documentation

## 2017-12-04 DIAGNOSIS — F17210 Nicotine dependence, cigarettes, uncomplicated: Secondary | ICD-10-CM | POA: Insufficient documentation

## 2017-12-04 DIAGNOSIS — M549 Dorsalgia, unspecified: Secondary | ICD-10-CM | POA: Diagnosis present

## 2017-12-04 DIAGNOSIS — R918 Other nonspecific abnormal finding of lung field: Secondary | ICD-10-CM | POA: Insufficient documentation

## 2017-12-04 HISTORY — DX: Bronchitis, not specified as acute or chronic: J40

## 2017-12-04 HISTORY — DX: Scoliosis, unspecified: M41.9

## 2017-12-04 LAB — TROPONIN I

## 2017-12-04 LAB — CBC WITH DIFFERENTIAL/PLATELET
BASOS ABS: 0 10*3/uL (ref 0.0–0.1)
Basophils Relative: 0 %
EOS PCT: 1 %
Eosinophils Absolute: 0.1 10*3/uL (ref 0.0–0.7)
HEMATOCRIT: 29.1 % — AB (ref 36.0–46.0)
Hemoglobin: 10 g/dL — ABNORMAL LOW (ref 12.0–15.0)
LYMPHS PCT: 17 %
Lymphs Abs: 1.6 10*3/uL (ref 0.7–4.0)
MCH: 33.1 pg (ref 26.0–34.0)
MCHC: 34.4 g/dL (ref 30.0–36.0)
MCV: 96.4 fL (ref 78.0–100.0)
MONO ABS: 0.8 10*3/uL (ref 0.1–1.0)
Monocytes Relative: 8 %
Neutro Abs: 7.4 10*3/uL (ref 1.7–7.7)
Neutrophils Relative %: 74 %
PLATELETS: 419 10*3/uL — AB (ref 150–400)
RBC: 3.02 MIL/uL — ABNORMAL LOW (ref 3.87–5.11)
RDW: 13.4 % (ref 11.5–15.5)
WBC: 9.9 10*3/uL (ref 4.0–10.5)

## 2017-12-04 LAB — COMPREHENSIVE METABOLIC PANEL
ALBUMIN: 3.2 g/dL — AB (ref 3.5–5.0)
ALT: 9 U/L (ref 0–44)
AST: 14 U/L — AB (ref 15–41)
Alkaline Phosphatase: 71 U/L (ref 38–126)
Anion gap: 12 (ref 5–15)
BUN: 11 mg/dL (ref 8–23)
CHLORIDE: 100 mmol/L (ref 98–111)
CO2: 26 mmol/L (ref 22–32)
Calcium: 9.5 mg/dL (ref 8.9–10.3)
Creatinine, Ser: 0.65 mg/dL (ref 0.44–1.00)
GFR calc Af Amer: >60 mL/min (ref >60)
GFR calc non Af Amer: >60 mL/min (ref >60)
GLUCOSE: 93 mg/dL (ref 70–99)
POTASSIUM: 3.6 mmol/L (ref 3.5–5.1)
SODIUM: 138 mmol/L (ref 135–145)
Total Bilirubin: 0.6 mg/dL (ref 0.3–1.2)
Total Protein: 8.5 g/dL — ABNORMAL HIGH (ref 6.5–8.1)

## 2017-12-04 MED ORDER — IOHEXOL 300 MG/ML  SOLN
75.0000 mL | Freq: Once | INTRAMUSCULAR | Status: AC | PRN
  Administered 2017-12-04: 75 mL via INTRAVENOUS

## 2017-12-04 MED ORDER — ACETAMINOPHEN 325 MG PO TABS
650.0000 mg | ORAL_TABLET | Freq: Once | ORAL | Status: AC
  Administered 2017-12-04: 650 mg via ORAL
  Filled 2017-12-04: qty 2

## 2017-12-04 MED ORDER — OXYCODONE-ACETAMINOPHEN 5-325 MG PO TABS: 1.0000 | ORAL_TABLET | Freq: Three times a day (TID) | ORAL | 0 refills | Status: AC | PRN

## 2017-12-04 NOTE — ED Triage Notes (Signed)
Per EMS-complaining of right upper back pain radiating to right shoulder for 3 days-history of scoliosis-

## 2017-12-04 NOTE — ED Provider Notes (Signed)
[facility] DEPT Provider Note   CSN: [id number] Arrival date & time: 12/04/17  1151     History   Chief Complaint Chief Complaint  Patient presents with  . Back Pain    HPI Sonya [name] is a 62 y.o. female a history of scoliosis, hypertension, and tobacco use disorder who presents to the emergency department with back pain.  The patient endorses constant midline upper back pain that radiates to the right shoulder and into her right ribs that began 3 days ago.  She characterizes the pain as sharp. She took 200 mg of ibuprofen 2 nights ago and a second dose of 200 mg of ibuprofen last night with minimal improvement of her pain.  She reports that she began to feel short of breath today, but states "I think it is my bronchitis."  Pain is worse with coughing and alleviated by nothing.  She has not been seen by primary care since 2017 when she was placed on lisinopril and clonidine for hypertension.  She states that she has not taken the medication "in as long as I can remember".  She denies fever, chills, palpitations, leg swelling, abdominal pain, nausea, vomiting, diarrhea, previous falls or injuries.  She is a current, every day smoker.  The history is provided by the patient. No language interpreter was used.    Past Medical History:  Diagnosis Date  . Bronchitis   . Chronic back pain   . Scoliosis     Patient Active Problem List   Diagnosis Date Noted  . Accelerated hypertension 06/20/2015  . Tobacco abuse 06/20/2015  . Chronic back pain     History reviewed. No pertinent surgical history.   OB History   None      Home Medications    Prior to Admission medications   Medication Sig Start Date End Date Taking? Authorizing Provider  ibuprofen (ADVIL,MOTRIN) 800 MG tablet Take 800 mg by mouth every 8 (eight) hours as needed for moderate pain.   Yes [provider]  oxyCODONE-acetaminophen (PERCOCET/ROXICET) 5-325 MG  tablet Take 1 tablet by mouth every 8 (eight) hours as needed for severe pain. 12/04/17   Zitlali Primm A, PA-C    Family History No family history on file.  Social History Social History   Tobacco Use  . Smoking status: Current Every Day Smoker    Packs/day: 0.25  Substance Use Topics  . Alcohol use: Yes    Comment: occ   . Drug use: No     Allergies   Patient has no known allergies.   Review of Systems Review of Systems  Constitutional: Negative for activity change, chills and fever.  HENT: Negative for congestion and sore throat.   Respiratory: Positive for cough and shortness of breath.   Cardiovascular: Positive for chest pain.  Gastrointestinal: Negative for abdominal pain.  Genitourinary: Negative for dysuria.  Musculoskeletal: Positive for arthralgias, back pain and myalgias.  Skin: Negative for rash.  Allergic/Immunologic: Negative for immunocompromised state.  Neurological: Negative for headaches.  Psychiatric/Behavioral: Negative for confusion.   Physical Exam Updated Vital Signs BP (!) 150/75 (BP Location: Left Arm)   Pulse (!) 116   Temp 99.2 F (37.3 C) (Oral)   Resp (!) 23   SpO2 96%   Physical Exam  Constitutional: No distress.  HENT:  Head: Normocephalic.  Eyes: Conjunctivae are normal.  Neck: Neck supple.  Cardiovascular: Regular rhythm, normal heart sounds and intact distal pulses. Tachycardia present. Exam reveals no gallop and no  friction rub.  No murmur heard. Pulses:      Radial pulses are 2+ on the right side, and 2+ on the left side.       Dorsalis pedis pulses are 2+ on the right side, and 2+ on the left side.  Pulmonary/Chest: Effort normal. No stridor. No respiratory distress. She has no wheezes. She has no rales.  Abdominal: Soft. She exhibits no distension and no mass. There is no tenderness. There is no rebound and no guarding. No hernia.  Musculoskeletal:  Reproducible TTP to the mid-line thoracic spine.  She is diffusely  tender to palpation from the spinous processes and throughout the right thoracic back, and right shoulder.  Radial pulses are 2+ and symmetric.  Sensation is intact and equal throughout the bilateral upper and lower extremities.  Good strength against resistance of the bilateral upper and lower extremities.  Capillary refill is less than 2 seconds.  No overlying erythema, edema, warmth, or rash.  No tenderness to the cervical or lumbar spinous processes or bilateral paraspinal muscles.  Neurological: She is alert.  Skin: Skin is warm. No rash noted.  Psychiatric: Her behavior is normal.  Nursing note and vitals reviewed.  ED Treatments / Results  Labs (all labs ordered are listed, but only abnormal results are displayed) Labs Reviewed  CBC WITH DIFFERENTIAL/PLATELET - Abnormal; Notable for the following components:      Result Value   RBC 3.02 (*)    Hemoglobin 10.0 (*)    HCT 29.1 (*)    Platelets 419 (*)    All other components within normal limits  COMPREHENSIVE METABOLIC PANEL - Abnormal; Notable for the following components:   Total Protein 8.5 (*)    Albumin 3.2 (*)    AST 14 (*)    All other components within normal limits  TROPONIN I    EKG EKG Interpretation  Date/Time:  Thursday December 04 2017 15:13:53 EDT Ventricular Rate:  95 PR Interval:    QRS Duration: 99 QT Interval:  365 QTC Calculation: 459 R Axis:   -3 Text Interpretation:  Sinus rhythm Ventricular premature complex Right atrial enlargement Abnormal R-wave progression, early transition No significant change was found Confirmed by Jola Schmidt [phone]) on 12/04/2017 3:20:18 PM   Radiology Dg Chest 2 View  Result Date: 12/04/2017 CLINICAL DATA:  Right upper back pain radiating to the right shoulder for 3 days. EXAM: CHEST - 2 VIEW COMPARISON:  05/24/2007 FINDINGS: Large opacity occupying the upper right hemithorax. Findings are suggestive for collapse of the right upper lobe but cannot exclude underlying  lesion or mass. In addition, there is concern for some fullness in the left paratracheal region. No large pleural effusions. Evidence for underlying lung hyperinflation. Heart size is normal. IMPRESSION: Large opacity in the right upper chest. Findings are suggestive for collapse of the right upper lobe but cannot exclude an underlying mass or large lesion. There is also suspicious soft tissue fullness on the left side of the mediastinum. Recommend further characterization with a chest CT with IV contrast. Electronically Signed   By: Markus Daft M.D.   On: 12/04/2017 14:36   Dg Thoracic Spine 2 View  Result Date: 12/04/2017 CLINICAL DATA:  Back pain with radiation to right shoulder. History of scoliosis. EXAM: THORACIC SPINE 2 VIEWS COMPARISON:  Chest x-ray 12/04/2017. FINDINGS: Upper thoracic spine scoliosis concave right 11 degrees. Diffuse degenerative change. Diffuse osteopenia. No acute bony abnormality. Atelectasis and consolidation right upper lung. Underlying malignancy cannot be excluded. IMPRESSION:  1.  Upper thoracic spine scoliosis concave right 11 degrees. 2. Atelectasis/consolidation right upper lung. Underlying malignancy cannot be excluded. Electronically Signed   By: Marcello Moores  Register   On: 12/04/2017 14:37   Ct Chest W Contrast  Result Date: 12/04/2017 CLINICAL DATA:  Right upper lung opacity on radiography. Right back and shoulder pain. EXAM: CT CHEST WITH CONTRAST TECHNIQUE: Multidetector CT imaging of the chest was performed during intravenous contrast administration. CONTRAST:  73mL OMNIPAQUE IOHEXOL 300 MG/ML  SOLN COMPARISON:  Radiographs from earlier the same day FINDINGS: Cardiovascular: There is irregular narrowing of the distal SVC, but no significant collateral filling to suggest this is of hemodynamic significance. Heart size remains normal. No pericardial effusion. There is a encasement and occlusion of right upper lobe pulmonary artery branch. Mild atheromatous plaque in the  aortic arch. No dissection or aneurysm. Mediastinum/Nodes: 0.7 cm prevascular mediastinal node. Can't exclude right suprahilar adenopathy confluent with central lung mass and adjacent pulmonary consolidation. Lungs/Pleura: Small layering right pleural effusion without nodularity or enhancement. No pneumothorax. Central right upper lobe mass estimated at least 7.1 cm with cut off of the right upper lobe bronchus and involvement of right upper lobe pulmonary artery branch and SVC, extending to the right hilum. There is consolidation/atelectasis of the entire right lung such as the exact margins of the mass cannot be ascertained. No convincing osseous or chest wall involvement. Right middle and lower lobes are unremarkable. 7 mm nodule in the anterior left upper lobe image 36 and adjacent 5 mm nodule just inferiorly image 40. Subpleural blebs in the left lung apex. Upper Abdomen: No adrenal nodule. Visualized liver unremarkable. No acute findings. Musculoskeletal: No fracture or worrisome bone lesion. 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 IMPRESSION: 1. Large right upper lobe/suprahilar mass involving right upper lobe bronchus, right upper lobe pulmonary artery, and SVC. Probably approachable for bronchoscopic biopsy. 2. Atelectasis/consolidation throughout the remainder of the right upper lobe. 3. Nonspecific left upper lobe subcentimeter pulmonary nodules. No other suggestion of adenopathy or distal metastatic disease. Electronically Signed   By: Lucrezia Europe M.D.   On: 12/04/2017 16:34    Procedures Procedures (including critical care time)  Medications Ordered in ED Medications  acetaminophen (TYLENOL) tablet 650 mg (650 mg Oral Given 12/04/17 1313)  iohexol (OMNIPAQUE) 300 MG/ML solution 75 mL (75 mLs Intravenous Contrast Given 12/04/17 1610)     Initial Impression / Assessment and Plan / ED Course  I have reviewed the triage vital signs and the nursing notes.  Pertinent  labs & imaging results that were available during my care of the patient were reviewed by me and considered in my medical decision making (see chart for details).     62 year old female with a history of hypertension, scoliosis, and tobacco use disorder presenting with mid back pain for 3 days.  Tachycardic in the 110s on arrival.  She is otherwise hemodynamically stable.  She is in no acute distress.  Tylenol given for pain control.  Chest x-ray demonstrating a large opacity in the right upper chest suggestive of collapse of the right upper lobe versus underlying mass versus a large lesion.  There is also suspicious soft tissue fullness on the left side of the mediastinum.  The patient was seen and evaluated along with Dr. Venora Maples, attending physician.  No leukocytosis.  Hemoglobin 10.0, down from 12.42 years ago.  She has a mild thrombocytosis of 419, likely reactive.  Labs are otherwise reassuring.  CT chest with a large right  upper lobe/suprahilar mass involving the right upper lobe bronchus, right upper lobe pulmonary artery, and SVC.  There is atelectasis and consolidation throughout the remainder of the right upper lobe.   These findings were discussed with the patient and we engaged in shared decision making conversation.  She was offered admission for further work-up of her findings on CT, but declined and preferred to follow-up on an outpatient basis with pain control for home.  The patient reports she has an appointment with a new PCP in approximately 1 month.  She does not know the physician's name or the name of the practice, but knows that he is located on [address].  Spoke with Mariann Laster with case management regarding concern for the patient's outpatient follow-up.  Given that the patient has previously been seen by [facility] health and wellness, Mariann Laster has arranged for Opal Sidles, the care coordinator at [facility] and wellness, to call the patient tomorrow to schedule a follow-up appointment with  primary care and biopsy appointment.  The patient's best contact number has been updated in Epic.  All questions have been answered and the patient is agreeable with the plan.  She has been given strict return precautions to the emergency department.  She is hemodynamically stable and in no acute distress.  Will discharge the patient home with an Rx for Percocet for pain control.  A 80-month prescription history query was performed using the [place] CSRS prior to discharge. She is safe for discharge home with outpatient follow-up at this time.  Final Clinical Impressions(s) / ED Diagnoses   Final diagnoses:  Mass of upper lobe of right lung    ED Discharge Orders        Ordered    oxyCODONE-acetaminophen (PERCOCET/ROXICET) 5-325 MG tablet  Every 8 hours PRN     12/04/17 1813       Azjah Pardo, Laymond Purser, PA-C 12/04/17 1839    Jola Schmidt, MD 12/05/17 [phone]

## 2017-12-04 NOTE — Discharge Instructions (Signed)
Thank you for allowing me to care for you today in the Emergency Department.   Please see the information above about receiving a phone call from [facility].  Take 1 tablet of Percocet every 8 hours as needed for severe pain.  You can also take 650 mg of Tylenol every 6 hours for mild to moderate pain.  Do not drive while taking Percocet as it is a controlled substance and can cause you to be impaired.  Return to the emergency department if you develop a high fever, severe shortness of breath, if you pass out, have severe chest pain, or other new, concerning symptoms.

## 2017-12-08 ENCOUNTER — Encounter: Payer: Self-pay | Admitting: Surgery

## 2017-12-08 ENCOUNTER — Telehealth: Payer: Self-pay

## 2017-12-08 NOTE — Telephone Encounter (Signed)
Message received from Laurena Slimmer, RN CM requesting a hospital follow up appointment for the patient.  This CM spoke to patient and scheduled her for an appointment for tomorrow- 12/09/17 @ 1530.  She was very appreciative of the call.   Update provided to Wendi Maya, RN CM

## 2017-12-08 NOTE — Progress Notes (Signed)
ED CM reached out to [facility] CM concerning ED follow up, appointment scheduled 7/23 at the [facility]. Patient updated as per clinic CM.

## 2017-12-09 ENCOUNTER — Ambulatory Visit: Payer: Medicaid Other | Admitting: Family Medicine

## 2017-12-09 ENCOUNTER — Telehealth: Payer: Self-pay
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
## 2017-12-10 ENCOUNTER — Ambulatory Visit: Payer: Medicaid Other | Admitting: Family Medicine

## 2017-12-10 ENCOUNTER — Telehealth: Payer: Self-pay

## 2017-12-10 NOTE — Telephone Encounter (Signed)
Call placed to the patient and informed her that there is an appointment available today @ 1510 and she said that she would take it.  She has the address to the clinic.

## 2018-01-06 ENCOUNTER — Inpatient Hospital Stay: Payer: Medicaid Other | Admitting: Nurse Practitioner

## 2018-03-17 ENCOUNTER — Inpatient Hospital Stay (HOSPITAL_COMMUNITY)
Admission: EM | Admit: 2018-03-17 | Discharge: 2018-03-28 | DRG: 181 | Disposition: A | Discharge status: Home Health | Payer: Medicaid Other | Attending: Internal Medicine | Admitting: Internal Medicine

## 2018-03-17 ENCOUNTER — Encounter (HOSPITAL_COMMUNITY): Payer: Self-pay | Admitting: Family Medicine

## 2018-03-17 ENCOUNTER — Emergency Department (HOSPITAL_COMMUNITY): Payer: Medicaid Other

## 2018-03-17 DIAGNOSIS — E8770 Fluid overload, unspecified: Secondary | ICD-10-CM | POA: Diagnosis not present

## 2018-03-17 DIAGNOSIS — R918 Other nonspecific abnormal finding of lung field: Secondary | ICD-10-CM

## 2018-03-17 DIAGNOSIS — Z9119 Patient's noncompliance with other medical treatment and regimen: Secondary | ICD-10-CM

## 2018-03-17 DIAGNOSIS — M549 Dorsalgia, unspecified: Secondary | ICD-10-CM | POA: Diagnosis present

## 2018-03-17 DIAGNOSIS — I82A13 Acute embolism and thrombosis of axillary vein, bilateral: Secondary | ICD-10-CM | POA: Diagnosis not present

## 2018-03-17 DIAGNOSIS — T380X5A Adverse effect of glucocorticoids and synthetic analogues, initial encounter: Secondary | ICD-10-CM | POA: Diagnosis not present

## 2018-03-17 DIAGNOSIS — T502X5A Adverse effect of carbonic-anhydrase inhibitors, benzothiadiazides and other diuretics, initial encounter: Secondary | ICD-10-CM | POA: Diagnosis not present

## 2018-03-17 DIAGNOSIS — I4891 Unspecified atrial fibrillation: Secondary | ICD-10-CM | POA: Diagnosis not present

## 2018-03-17 DIAGNOSIS — I82A11 Acute embolism and thrombosis of right axillary vein: Secondary | ICD-10-CM

## 2018-03-17 DIAGNOSIS — J95811 Postprocedural pneumothorax: Secondary | ICD-10-CM

## 2018-03-17 DIAGNOSIS — J9811 Atelectasis: Secondary | ICD-10-CM | POA: Diagnosis present

## 2018-03-17 DIAGNOSIS — I160 Hypertensive urgency: Secondary | ICD-10-CM | POA: Diagnosis not present

## 2018-03-17 DIAGNOSIS — I270 Primary pulmonary hypertension: Secondary | ICD-10-CM

## 2018-03-17 DIAGNOSIS — I82A12 Acute embolism and thrombosis of left axillary vein: Secondary | ICD-10-CM

## 2018-03-17 DIAGNOSIS — C3411 Malignant neoplasm of upper lobe, right bronchus or lung: Principal | ICD-10-CM | POA: Diagnosis present

## 2018-03-17 DIAGNOSIS — R64 Cachexia: Secondary | ICD-10-CM | POA: Diagnosis present

## 2018-03-17 DIAGNOSIS — G8929 Other chronic pain: Secondary | ICD-10-CM | POA: Diagnosis present

## 2018-03-17 DIAGNOSIS — I288 Other diseases of pulmonary vessels: Secondary | ICD-10-CM | POA: Diagnosis present

## 2018-03-17 DIAGNOSIS — D6869 Other thrombophilia: Secondary | ICD-10-CM

## 2018-03-17 DIAGNOSIS — D649 Anemia, unspecified: Secondary | ICD-10-CM

## 2018-03-17 DIAGNOSIS — I871 Compression of vein: Secondary | ICD-10-CM | POA: Diagnosis present

## 2018-03-17 DIAGNOSIS — E876 Hypokalemia: Secondary | ICD-10-CM | POA: Diagnosis not present

## 2018-03-17 DIAGNOSIS — D72829 Elevated white blood cell count, unspecified: Secondary | ICD-10-CM | POA: Diagnosis not present

## 2018-03-17 DIAGNOSIS — Z681 Body mass index (BMI) 19 or less, adult: Secondary | ICD-10-CM

## 2018-03-17 DIAGNOSIS — J441 Chronic obstructive pulmonary disease with (acute) exacerbation: Secondary | ICD-10-CM | POA: Diagnosis present

## 2018-03-17 DIAGNOSIS — R000 Tachycardia, unspecified: Secondary | ICD-10-CM | POA: Diagnosis not present

## 2018-03-17 DIAGNOSIS — I82629 Acute embolism and thrombosis of deep veins of unspecified upper extremity: Secondary | ICD-10-CM | POA: Diagnosis present

## 2018-03-17 DIAGNOSIS — F17210 Nicotine dependence, cigarettes, uncomplicated: Secondary | ICD-10-CM | POA: Diagnosis present

## 2018-03-17 DIAGNOSIS — I10 Essential (primary) hypertension: Secondary | ICD-10-CM | POA: Diagnosis present

## 2018-03-17 HISTORY — DX: Chronic obstructive pulmonary disease, unspecified: J44.9

## 2018-03-17 HISTORY — DX: Unspecified atrial fibrillation: I48.91

## 2018-03-17 HISTORY — DX: Other nonspecific abnormal finding of lung field: R91.8

## 2018-03-17 HISTORY — DX: Essential (primary) hypertension: I10

## 2018-03-17 LAB — I-STAT TROPONIN, ED: TROPONIN I, POC: 0 ng/mL (ref 0.00–0.08)

## 2018-03-17 LAB — CBC WITH DIFFERENTIAL/PLATELET
ABS IMMATURE GRANULOCYTES: 0.04 10*3/uL (ref 0.00–0.07)
Basophils Absolute: 0.1 10*3/uL (ref 0.0–0.1)
Basophils Relative: 1 %
EOS PCT: 1 %
Eosinophils Absolute: 0.1 10*3/uL (ref 0.0–0.5)
HEMATOCRIT: 28 % — AB (ref 36.0–46.0)
HEMOGLOBIN: 9 g/dL — AB (ref 12.0–15.0)
Immature Granulocytes: 1 %
LYMPHS ABS: 0.7 10*3/uL (ref 0.7–4.0)
LYMPHS PCT: 9 %
MCH: 30.4 pg (ref 26.0–34.0)
MCHC: 32.1 g/dL (ref 30.0–36.0)
MCV: 94.6 fL (ref 80.0–100.0)
MONO ABS: 0.7 10*3/uL (ref 0.1–1.0)
Monocytes Relative: 10 %
Neutro Abs: 5.8 10*3/uL (ref 1.7–7.7)
Neutrophils Relative %: 78 %
Platelets: 306 10*3/uL (ref 150–400)
RBC: 2.96 MIL/uL — AB (ref 3.87–5.11)
RDW: 13.3 % (ref 11.5–15.5)
WBC: 7.4 10*3/uL (ref 4.0–10.5)
nRBC: 0 % (ref 0.0–0.2)

## 2018-03-17 LAB — PROTIME-INR
INR: 1.16
Prothrombin Time: 14.7 seconds (ref 11.4–15.2)

## 2018-03-17 LAB — COMPREHENSIVE METABOLIC PANEL
ALBUMIN: 2.9 g/dL — AB (ref 3.5–5.0)
ALK PHOS: 69 U/L (ref 38–126)
ALT: 8 U/L (ref 0–44)
AST: 12 U/L — ABNORMAL LOW (ref 15–41)
Anion gap: 8 (ref 5–15)
BUN: 9 mg/dL (ref 8–23)
CALCIUM: 11.4 mg/dL — AB (ref 8.9–10.3)
CO2: 26 mmol/L (ref 22–32)
Chloride: 100 mmol/L (ref 98–111)
Creatinine, Ser: 0.56 mg/dL (ref 0.44–1.00)
GFR calc non Af Amer: >60 mL/min (ref >60)
Glucose, Bld: 107 mg/dL — ABNORMAL HIGH (ref 70–99)
Potassium: 3.8 mmol/L (ref 3.5–5.1)
SODIUM: 134 mmol/L — AB (ref 135–145)
TOTAL PROTEIN: 7.8 g/dL (ref 6.5–8.1)
Total Bilirubin: 0.6 mg/dL (ref 0.3–1.2)

## 2018-03-17 LAB — APTT: APTT: 28 s (ref 24–36)

## 2018-03-17 MED ORDER — DM-GUAIFENESIN ER 30-600 MG PO TB12
1.0000 | ORAL_TABLET | Freq: Two times a day (BID) | ORAL | Status: DC
  Administered 2018-03-17 – 2018-03-28 (×22): 1 via ORAL
  Filled 2018-03-17 (×22): qty 1

## 2018-03-17 MED ORDER — IOPAMIDOL (ISOVUE-370) INJECTION 76%
INTRAVENOUS | Status: AC
  Filled 2018-03-17: qty 100

## 2018-03-17 MED ORDER — SODIUM CHLORIDE 0.9 % IV BOLUS
1000.0000 mL | Freq: Once | INTRAVENOUS | Status: AC
  Administered 2018-03-17: 1000 mL via INTRAVENOUS

## 2018-03-17 MED ORDER — SODIUM CHLORIDE 0.9 % IV SOLN
INTRAVENOUS | Status: DC
  Administered 2018-03-17 – 2018-03-19 (×4): via INTRAVENOUS
  Administered 2018-03-19: 125 mL/h via INTRAVENOUS

## 2018-03-17 MED ORDER — IOPAMIDOL (ISOVUE-370) INJECTION 76%
100.0000 mL | Freq: Once | INTRAVENOUS | Status: AC | PRN
  Administered 2018-03-17: 100 mL via INTRAVENOUS

## 2018-03-17 MED ORDER — HEPARIN (PORCINE) IN NACL 100-0.45 UNIT/ML-% IJ SOLN
1000.0000 [IU]/h | INTRAMUSCULAR | Status: DC
  Administered 2018-03-17: 1000 [IU]/h via INTRAVENOUS
  Filled 2018-03-17 (×2): qty 250

## 2018-03-17 MED ORDER — ADULT MULTIVITAMIN W/MINERALS CH
1.0000 | ORAL_TABLET | Freq: Every day | ORAL | Status: DC
  Administered 2018-03-17 – 2018-03-28 (×12): 1 via ORAL
  Filled 2018-03-17 (×13): qty 1

## 2018-03-17 MED ORDER — SODIUM CHLORIDE 0.9 % IJ SOLN
INTRAMUSCULAR | Status: AC
  Filled 2018-03-17: qty 50

## 2018-03-17 MED ORDER — HEPARIN BOLUS VIA INFUSION
4000.0000 [IU] | Freq: Once | INTRAVENOUS | Status: AC
  Administered 2018-03-17: 4000 [IU] via INTRAVENOUS
  Filled 2018-03-17: qty 4000

## 2018-03-17 NOTE — ED Notes (Addendum)
Pt being transported to the floor

## 2018-03-17 NOTE — ED Triage Notes (Signed)
Patient is from home and transported via [facility]. Patient was seen back in July, 2019 at [facility] for midline upper back pain. Dx with a large right upper lobe/suprhilar mass involving the right upper lobe bronchus, right upper lobe pulmonary artery, and SVC. Today, patient is experiencing shortness of breath for the last two weeks. Also, has a nonproductive cough that is dry. Unable to sleep and eat. Lost a large amount of weight recently.

## 2018-03-17 NOTE — ED Notes (Signed)
Bed: WA22 Expected date:  Expected time:  Means of arrival:  Comments: EMS 

## 2018-03-17 NOTE — ED Notes (Signed)
Attempted to call report to floor. RN will call right back

## 2018-03-17 NOTE — Progress Notes (Addendum)
[facility] for IV heparin Indication: r/o PE  No Known Allergies  Patient Measurements: Height: 5\' 5"  (165.1 cm) Weight: 125 lb (56.7 kg) IBW/kg (Calculated) : 57 Heparin Dosing Weight: TBW  Vital Signs: Temp: 97.8 F (36.6 C) (10/29 1601) Temp Source: Oral (10/29 1601) BP: 147/86 (10/29 1930) Pulse Rate: 92 (10/29 1930)  Labs: Recent Labs    03/17/18 1700  HGB 9.0*  HCT 28.0*  PLT 306  CREATININE 0.56    Estimated Creatinine Clearance: 65.3 mL/min (by C-G formula based on SCr of 0.56 mg/dL).   Medical History: Past Medical History:  Diagnosis Date  . Bronchitis   . Chronic back pain   . Scoliosis     Medications:   (Not in a hospital admission) Scheduled:  . heparin  4,000 Units Intravenous Once  . iopamidol      . sodium chloride       PRN:   Assessment: 89 yoF, current smoker, recent Dx of RUL mass involving SVC 3 months ago but did not follow up. Now with progressive SOB x 2 wks. CTA shows enlarged R lung mass with occlusion of RML pulmonary artery. Oncologist consulted, recommended starting IV heparin to r/o thrombus and IR biopsy tomorrow.    Baseline INR, aPTT: pending  Prior anticoagulation: none  Significant events:  Today, 03/17/2018:  CBC: hgb low but similar to earlier this year; Plt wnl  No bleeding or infusion issues per nursing  CrCl: 65 ml/min  Goal of Therapy: Heparin level 0.3-0.7 units/ml Monitor platelets by anticoagulation protocol: Yes  Plan:  Heparin 4000 unit (70 unit/kg) IV bolus x 1  Heparin 1000 units/hr (80 units/kg/hr) IV infusion  Check heparin level 6 hrs after start  Daily CBC, daily heparin level once stable  Monitor for signs of bleeding or thrombosis  F/u timing for Bx tomorrow; hold per MD instructions   Reuel Boom, PharmD, BCPS [phone] 03/17/2018, 8:16 PM

## 2018-03-17 NOTE — ED Notes (Signed)
Admitting MD at bedside then transporting pt to [id number]

## 2018-03-17 NOTE — H&P (Signed)
History and Physical    Sonya [name] TWS:[id number] DOB: 1956/01/31 DOA: 03/17/2018  PCP: Default, Provider, MD Patient coming from: Home  Chief Complaint: SOB  HPI: Sonya [name] is a 62 y.o. female with medical history significant of right lung mass presenting to the hospital for evaluation of shortness of breath. 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  Patient was offered admission for further work-up at that time but she declined it.  Plan was for her to follow-up with her primary care provider. Patient states she did not follow-up with a doctor since her ED discharge in July.  She continued to smoke cigarettes and quit a week ago; history of smoking 1 pack/day for 37 years.  States that she has been feeling short of breath with exertion and when lying down for the past 2 weeks.  Also having a cough productive of white-colored sputum.  Symptoms have been getting progressively worse.  Denies having any hemoptysis.  She has unintentionally lost 5 to 6 pounds since July.  Denies having any chest pain.  Denies having any hematemesis, melena, or hematochezia.  ED Course: Afebrile.  Tachycardic initially but now pulse normal.  Continues to be tachypneic.  Not hypotensive.  Satting well on room air.  No leukocytosis.  Corrected calcium 12.3.  I-STAT troponin negative.  EKG not suggestive of ACS.  Chest x-ray showing known right suprahilar mass with associated upper lobe atelectasis.  CTA chest showing no pulmonary embolus, however, increased vascular invasion and/or mass-effect by the right suprahilar mass, resulting in new occlusion of the right middle lobe pulmonary artery and persistent right upper lobe pulmonary artery occlusion as well as occlusion/invasion of the superior vena cava.  Debris within the right mainstem  bronchus.  Right upper lobe atelectasis.  CT abdomen pelvis showing no acute abnormality.  ED physician discussed the case with Dr. Jana Hakim from oncology who recommended starting the patient on IV heparin and consulting IR in the morning for biopsy. [facility] paged to admit.   Review of Systems: As per HPI otherwise 10 point review of systems negative.  Past Medical History:  Diagnosis Date  . Bronchitis   . Chronic back pain   . Scoliosis     History reviewed. No pertinent surgical history.   reports that she has been smoking. She has been smoking about 0.25 packs per day. She has never used smokeless tobacco. She reports that she drank alcohol. She reports that she does not use drugs.  No Known Allergies  History reviewed. No pertinent family history.  Prior to Admission medications   Medication Sig Start Date End Date Taking? Authorizing Provider  ibuprofen (ADVIL,MOTRIN) 200 MG tablet Take 200-400 mg by mouth every 6 (six) hours as needed for headache or moderate pain.   Yes [provider]  Multiple Vitamin (MULTIVITAMIN WITH MINERALS) TABS tablet Take 1 tablet by mouth daily.   Yes [provider]  oxyCODONE-acetaminophen (PERCOCET/ROXICET) 5-325 MG tablet Take 1 tablet by mouth every 8 (eight) hours as needed for severe pain. Patient not taking: Reported on 03/17/2018 12/04/17   Joanne Gavel, PA-C    Physical Exam: Vitals:   03/17/18 1930 03/17/18 2016 03/17/18 2030 03/17/18 2114  BP: (!) 147/86 (!) 148/79 (!) 149/84 (!) 149/87  Pulse: 92 94 91 93  Resp: (!) 23 18 (!) 27 19  Temp:      TempSrc:      SpO2: 94% 96% 95% 95%  Weight:      Height:       Physical Exam  Constitutional: She is oriented to person, place, and time. No distress.  Appears cachectic  HENT:  Head: Normocephalic and atraumatic.  Mouth/Throat: Oropharynx is clear and moist.  Eyes: Right eye exhibits no discharge. Left eye exhibits no discharge.  Neck: Neck supple. No tracheal  deviation present.  Cardiovascular: Normal rate, regular rhythm and intact distal pulses.  Pulmonary/Chest: Effort normal. No respiratory distress. She has no wheezes. She has no rales.  Decreased breath sounds at the right upper lobe.  Abdominal: Soft. Bowel sounds are normal. She exhibits no distension. There is no tenderness.  Musculoskeletal: She exhibits no edema.  Neurological: She is alert and oriented to person, place, and time.  Skin: Skin is warm and dry. She is not diaphoretic.  Psychiatric: She has a normal mood and affect.     Labs on Admission: I have personally reviewed following labs and imaging studies  CBC: Recent Labs  Lab 03/17/18 1700  WBC 7.4  NEUTROABS 5.8  HGB 9.0*  HCT 28.0*  MCV 94.6  PLT 683   Basic Metabolic Panel: Recent Labs  Lab 03/17/18 1700  NA 134*  K 3.8  CL 100  CO2 26  GLUCOSE 107*  BUN 9  CREATININE 0.56  CALCIUM 11.4*   GFR: Estimated Creatinine Clearance: 65.3 mL/min (by C-G formula based on SCr of 0.56 mg/dL). Liver Function Tests: Recent Labs  Lab 03/17/18 1700  AST 12*  ALT 8  ALKPHOS 69  BILITOT 0.6  PROT 7.8  ALBUMIN 2.9*   No results for input(s): LIPASE, AMYLASE in the last 168 hours. No results for input(s): AMMONIA in the last 168 hours. Coagulation Profile: Recent Labs  Lab 03/17/18 2006  INR 1.16   Cardiac Enzymes: No results for input(s): CKTOTAL, CKMB, CKMBINDEX, TROPONINI in the last 168 hours. BNP (last 3 results) No results for input(s): PROBNP in the last 8760 hours. HbA1C: No results for input(s): HGBA1C in the last 72 hours. CBG: No results for input(s): GLUCAP in the last 168 hours. Lipid Profile: No results for input(s): CHOL, HDL, LDLCALC, TRIG, CHOLHDL, LDLDIRECT in the last 72 hours. Thyroid Function Tests: No results for input(s): TSH, T4TOTAL, FREET4, T3FREE, THYROIDAB in the last 72 hours. Anemia Panel: No results for input(s): VITAMINB12, FOLATE, FERRITIN, TIBC, IRON, RETICCTPCT  in the last 72 hours. Urine analysis: No results found for: COLORURINE, APPEARANCEUR, LABSPEC, [place], GLUCOSEU, [place], [place], [place], [place], UROBILINOGEN, NITRITE, LEUKOCYTESUR  Radiological Exams on Admission: Dg Chest 2 View  Result Date: 03/17/2018 CLINICAL DATA:  Shortness of breath EXAM: CHEST - 2 VIEW COMPARISON:  Chest CT 12/04/2017 FINDINGS: Consolidation of the right upper lobe consistent with known upper lobe mass and likely postobstructive atelectasis. No pleural effusion. Heart size normal. The left lung is clear. IMPRESSION: Known right suprahilar mass with associated upper lobe atelectasis. Lungs are otherwise clear. Electronically Signed   By: Ulyses Jarred M.D.   On: 03/17/2018 18:01   Ct Angio Chest Pe W And/or Wo Contrast  Result Date: 03/17/2018 CLINICAL DATA:  Shortness of breath and back pain. Known right lung mass. EXAM: CT ANGIOGRAPHY CHEST CT ABDOMEN AND PELVIS WITH CONTRAST TECHNIQUE: Multidetector CT imaging of the chest was performed using the standard protocol during bolus administration of intravenous contrast. Multiplanar CT image reconstructions and MIPs were obtained to evaluate the  vascular anatomy. Multidetector CT imaging of the abdomen and pelvis was performed using the standard protocol during bolus administration of intravenous contrast. CONTRAST:  17mL ISOVUE-370 IOPAMIDOL (ISOVUE-370) INJECTION 76% COMPARISON:  Chest CT 12/04/2017 FINDINGS: CTA CHEST FINDINGS Cardiovascular: Contrast injection is sufficient to demonstrate satisfactory opacification of the pulmonary arteries to the segmental level. There is no pulmonary embolus. The main pulmonary artery is within normal limits for size. 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 The inter lobar artery is now also occluded. There is calcific aortic atherosclerosis. No dissection. There is a normal variant aortic arch  branching pattern with the brachiocephalic and left common carotid arteries sharing a common origin. Heart size is normal, without pericardial effusion. There are large paraspinal venous collaterals. Mediastinum/Nodes: Right-sided mediastinal fat is effaced by the low right suprahilar mass. Multiple subcentimeter bilateral axillary lymph nodes. Lungs/Pleura: There is debris within the right mainstem bronchus and bronchus intermedius. Right upper lobe bronchus is occluded. There is a large suprahilar right lung mass that appears unchanged in size. The mass is difficult to separate from the surrounding lung. 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 Increased confluent nodularity in the left lower lobe. Clustered nodules in the left upper lobe have increased in size, measuring up to 12 mm. Musculoskeletal: No chest wall abnormality. No acute or significant osseous findings. Review of the MIP images confirms the above findings. CT ABDOMEN and PELVIS FINDINGS Hepatobiliary: Normal hepatic contours and density. No visible biliary dilatation. Normal gallbladder. Pancreas: Normal contours without ductal dilatation. No peripancreatic fluid collection. Spleen: Normal. Adrenals/Urinary Tract: Normal adrenal glands. 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 Stomach/Bowel: Matted bowel in the lower abdomen and pelvis. No dilatation or inflammation visualized. Vascular/Lymphatic: Atherosclerotic calcification is present within the non-aneurysmal abdominal aorta, without hemodynamically significant stenosis. Major abdominal veins are patent. No abdominal or pelvic lymphadenopathy. Reproductive: There are multiple enhancing nodules within the posterior left pelvis. Difficult to accurately localize these lesions due to crowding pelvic structures, but these may be fibroids within the uterine wall. Musculoskeletal. No bony spinal canal stenosis or focal osseous abnormality. Other: None.  IMPRESSION: 1. 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 2. Debris within the right mainstem bronchus. Right upper lobe atelectasis. 3. No acute abnormality of the abdomen or pelvis. Matted appearance of the small bowel and colon without dilatation or inflammation. 4. Enhancing nodular foci along the posterior left pelvis. Exact location is not entirely clear, but these are suspected to be subserosal fibroids of the left uterine fundus. Aortic Atherosclerosis (ICD10-I70.0). Electronically Signed   By: Ulyses Jarred M.D.   On: 03/17/2018 19:14   Ct Abdomen Pelvis W Contrast  Result Date: 03/17/2018 CLINICAL DATA:  Shortness of breath and back pain. Known right lung mass. EXAM: CT ANGIOGRAPHY CHEST CT ABDOMEN AND PELVIS WITH CONTRAST TECHNIQUE: Multidetector CT imaging of the chest was performed using the standard protocol during bolus administration of intravenous contrast. Multiplanar CT image reconstructions and MIPs were obtained to evaluate the vascular anatomy. Multidetector CT imaging of the abdomen and pelvis was performed using the standard protocol during bolus administration of intravenous contrast. CONTRAST:  176mL ISOVUE-370 IOPAMIDOL (ISOVUE-370) INJECTION 76% COMPARISON:  Chest CT 12/04/2017 FINDINGS: CTA CHEST FINDINGS  Cardiovascular: Contrast injection is sufficient to demonstrate satisfactory opacification of the pulmonary arteries to the segmental level. There is no pulmonary embolus. The main pulmonary artery is within normal limits for size. 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 The inter lobar artery is now also occluded. There is calcific aortic atherosclerosis.  No dissection. There is a normal variant aortic arch branching pattern with the brachiocephalic and left common carotid arteries sharing a common origin. Heart size is normal, without pericardial effusion. There are large paraspinal venous collaterals. Mediastinum/Nodes: Right-sided mediastinal fat is effaced by the low right suprahilar mass. Multiple subcentimeter bilateral axillary lymph nodes. Lungs/Pleura: There is debris within the right mainstem bronchus and bronchus intermedius. Right upper lobe bronchus is occluded. There is a large suprahilar right lung mass that appears unchanged in size. The mass is difficult to separate from the surrounding lung. 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 Increased confluent nodularity in the left lower lobe. Clustered nodules in the left upper lobe have increased in size, measuring up to 12 mm. Musculoskeletal: No chest wall abnormality. No acute or significant osseous findings. Review of the MIP images confirms the above findings. CT ABDOMEN and PELVIS FINDINGS Hepatobiliary: Normal hepatic contours and density. No visible biliary dilatation. Normal gallbladder. Pancreas: Normal contours without ductal dilatation. No peripancreatic fluid collection. Spleen: Normal. Adrenals/Urinary Tract: Normal adrenal glands. 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 Stomach/Bowel: Matted bowel in the lower abdomen and pelvis. No dilatation or inflammation visualized. Vascular/Lymphatic: Atherosclerotic calcification is present within the non-aneurysmal abdominal aorta, without hemodynamically significant stenosis. Major abdominal veins are patent. No abdominal or pelvic lymphadenopathy. Reproductive: There are multiple enhancing nodules within the posterior left pelvis. Difficult to accurately localize these lesions due to crowding pelvic structures, but these may be fibroids within the uterine wall. Musculoskeletal. No bony spinal canal  stenosis or focal osseous abnormality. Other: None. IMPRESSION: 1. 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 2. Debris within the right mainstem bronchus. Right upper lobe atelectasis. 3. No acute abnormality of the abdomen or pelvis. Matted appearance of the small bowel and colon without dilatation or inflammation. 4. Enhancing nodular foci along the posterior left pelvis. Exact location is not entirely clear, but these are suspected to be subserosal fibroids of the left uterine fundus. Aortic Atherosclerosis (ICD10-I70.0). Electronically Signed   By: Ulyses Jarred M.D.   On: 03/17/2018 19:14    EKG: Independently reviewed.  Sinus tachycardia with heart rate 105, borderline PR prolongation.   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  Previously seen in the ED on December 04, 2017 and CT chest at that time revealed a large right upper lobe/suprahilar mass involving the right upper lobe bronchus, right upper lobe pulmonary artery, and SVC.  Patient has not followed up with a primary care provider since then.  She has continued to smoke 1 pack/day; quit a week ago. Currently satting well on room air.  Labs showing no leukocytosis. CTA chest showing increased vascular invasion and/or mass-effect by the right suprahilar mass,  resulting in new occlusion of the right middle lobe pulmonary artery and persistent right upper lobe pulmonary artery occlusion as well as occlusion/invasion of the superior vena cava.  Debris within the right mainstem bronchus.  Right  upper lobe atelectasis.  ED physician discussed the case with Dr. Jana Hakim from oncology who recommended starting the patient on IV heparin and consulting IR in the morning for biopsy. -IV heparin -Mucinex DM for cough -Sputum cytology -Consult IR in the morning for biopsy -Consult Dr. Jana Hakim after biopsy is done  Chronic anemia -Hemoglobin 9.0, was 10.0 three months ago.  Patient denies having any hemoptysis, hematemesis, melena, or hematochezia. -Check iron, ferritin, TIBC -Repeat CBC in a.m.  Hypercalcemia -Corrected calcium 12.3.  Likely related to presumed underlying malignancy.  Patient is asymptomatic. -Continue to monitor; check ionized calcium level -IV fluid hydration  DVT prophylaxis: Heparin Code Status: Patient wishes to be full code Family Communication: No family present at bedside Disposition Plan: Anticipate discharge to home in 1 to 2 days. Consults called: Oncology-Dr. Magrinat Admission status: Observation   Shela Leff MD [facility] Pager [phone]  If 7PM-7AM, please contact night-coverage [email or web address] Password [email or web address]  03/17/2018, 9:16 PM

## 2018-03-17 NOTE — ED Notes (Signed)
ED TO INPATIENT HANDOFF REPORT  Name/Age/Gender Sonya [name] 62 y.o. female  Code Status   Home/SNF/Other Home  Chief Complaint shortness of breath  Level of Care/Admitting Diagnosis ED Disposition    ED Disposition Condition [facility]: [place] [[id number]]  Level of Care: Telemetry [5]  Admit to tele based on following criteria: Other see comments  Comments: tachycardia  Diagnosis: Pulmonary mass [948546]  Admitting Physician: Shela Leff [[id number]]  Attending Physician: Shela Leff [[id number]]  PT Class (Do Not Modify): Observation [104]  PT Acc Code (Do Not Modify): Observation [10022]       Medical History Past Medical History:  Diagnosis Date  . Bronchitis   . Chronic back pain   . Scoliosis     Allergies No Known Allergies  IV Location/Drains/Wounds Patient Lines/Drains/Airways Status   Active Line/Drains/Airways    Name:   Placement date:   Placement time:   Site:   Days:   Peripheral IV 03/17/18 Right Forearm   03/17/18    1701    Forearm   less than 1          Labs/Imaging Results for orders placed or performed during the hospital encounter of 03/17/18 (from the past 48 hour(s))  CBC with Differential/Platelet     Status: Abnormal   Collection Time: 03/17/18  5:00 PM  Result Value Ref Range   WBC 7.4 4.0 - 10.5 K/uL   RBC 2.96 (L) 3.87 - 5.11 MIL/uL   Hemoglobin 9.0 (L) 12.0 - 15.0 g/dL   HCT 28.0 (L) 36.0 - 46.0 %   MCV 94.6 80.0 - 100.0 fL   MCH 30.4 26.0 - 34.0 pg   MCHC 32.1 30.0 - 36.0 g/dL   RDW 13.3 11.5 - 15.5 %   Platelets 306 150 - 400 K/uL   nRBC 0.0 0.0 - 0.2 %   Neutrophils Relative % 78 %   Neutro Abs 5.8 1.7 - 7.7 K/uL   Lymphocytes Relative 9 %   Lymphs Abs 0.7 0.7 - 4.0 K/uL   Monocytes Relative 10 %   Monocytes Absolute 0.7 0.1 - 1.0 K/uL   Eosinophils Relative 1 %   Eosinophils Absolute 0.1 0.0 - 0.5 K/uL   Basophils Relative 1 %   Basophils Absolute 0.1  0.0 - 0.1 K/uL   Immature Granulocytes 1 %   Abs Immature Granulocytes 0.04 0.00 - 0.07 K/uL    Comment: Performed at [facility], [place] [address] [place],  [address]  Comprehensive metabolic panel     Status: Abnormal   Collection Time: 03/17/18  5:00 PM  Result Value Ref Range   Sodium 134 (L) 135 - 145 mmol/L   Potassium 3.8 3.5 - 5.1 mmol/L   Chloride 100 98 - 111 mmol/L   CO2 26 22 - 32 mmol/L   Glucose, Bld 107 (H) 70 - 99 mg/dL   BUN 9 8 - 23 mg/dL   Creatinine, Ser 0.56 0.44 - 1.00 mg/dL   Calcium 11.4 (H) 8.9 - 10.3 mg/dL   Total Protein 7.8 6.5 - 8.1 g/dL   Albumin 2.9 (L) 3.5 - 5.0 g/dL   AST 12 (L) 15 - 41 U/L   ALT 8 0 - 44 U/L   Alkaline Phosphatase 69 38 - 126 U/L   Total Bilirubin 0.6 0.3 - 1.2 mg/dL   GFR calc non Af Amer >60 >60 mL/min   GFR calc Af Amer >60 >60 mL/min  Comment: (NOTE) The eGFR has been calculated using the CKD EPI equation. This calculation has not been validated in all clinical situations. eGFR's persistently <60 mL/min signify possible Chronic Kidney Disease.    Anion gap 8 5 - 15    Comment: Performed at [facility], [place] [address], [place], [place] [address]  I-stat troponin, ED     Status: None   Collection Time: 03/17/18  5:05 PM  Result Value Ref Range   Troponin i, poc 0.00 0.00 - 0.08 ng/mL   Comment 3            Comment: Due to the release kinetics of cTnI, a negative result within the first hours of the onset of symptoms does not rule out myocardial infarction with certainty. If myocardial infarction is still suspected, repeat the test at appropriate intervals.    Dg Chest 2 View  Result Date: 03/17/2018 CLINICAL DATA:  Shortness of breath EXAM: CHEST - 2 VIEW COMPARISON:  Chest CT 12/04/2017 FINDINGS: Consolidation of the right upper lobe consistent with known upper lobe mass and likely postobstructive atelectasis. No pleural effusion. Heart size normal. The left lung is  clear. IMPRESSION: Known right suprahilar mass with associated upper lobe atelectasis. Lungs are otherwise clear. Electronically Signed   By: Ulyses Jarred M.D.   On: 03/17/2018 18:01   Ct Angio Chest Pe W And/or Wo Contrast  Result Date: 03/17/2018 CLINICAL DATA:  Shortness of breath and back pain. Known right lung mass. EXAM: CT ANGIOGRAPHY CHEST CT ABDOMEN AND PELVIS WITH CONTRAST TECHNIQUE: Multidetector CT imaging of the chest was performed using the standard protocol during bolus administration of intravenous contrast. Multiplanar CT image reconstructions and MIPs were obtained to evaluate the vascular anatomy. Multidetector CT imaging of the abdomen and pelvis was performed using the standard protocol during bolus administration of intravenous contrast. CONTRAST:  174m ISOVUE-370 IOPAMIDOL (ISOVUE-370) INJECTION 76% COMPARISON:  Chest CT 12/04/2017 FINDINGS: CTA CHEST FINDINGS Cardiovascular: Contrast injection is sufficient to demonstrate satisfactory opacification of the pulmonary arteries to the segmental level. There is no pulmonary embolus. The main pulmonary artery is within normal limits for size. 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 The inter lobar artery is now also occluded. There is calcific aortic atherosclerosis. No dissection. There is a normal variant aortic arch branching pattern with the brachiocephalic and left common carotid arteries sharing a common origin. Heart size is normal, without pericardial effusion. There are large paraspinal venous collaterals. Mediastinum/Nodes: Right-sided mediastinal fat is effaced by the low right suprahilar mass. Multiple subcentimeter bilateral axillary lymph nodes. Lungs/Pleura: There is debris within the right mainstem bronchus and bronchus intermedius. Right upper lobe bronchus is occluded. There is a large suprahilar right lung mass that appears unchanged in size.  The mass is difficult to separate from the surrounding lung. 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 Increased confluent nodularity in the left lower lobe. Clustered nodules in the left upper lobe have increased in size, measuring up to 12 mm. Musculoskeletal: No chest wall abnormality. No acute or significant osseous findings. Review of the MIP images confirms the above findings. CT ABDOMEN and PELVIS FINDINGS Hepatobiliary: Normal hepatic contours and density. No visible biliary dilatation. Normal gallbladder. Pancreas: Normal contours without ductal dilatation. No peripancreatic fluid collection. Spleen: Normal. Adrenals/Urinary Tract: Normal adrenal glands. 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 Stomach/Bowel: Matted bowel in the lower abdomen and pelvis. No dilatation  or inflammation visualized. Vascular/Lymphatic: Atherosclerotic calcification is present within the non-aneurysmal abdominal aorta, without hemodynamically significant stenosis. Major abdominal veins are patent. No abdominal or pelvic lymphadenopathy. Reproductive: There are multiple enhancing nodules within the posterior left pelvis. Difficult to accurately localize these lesions due to crowding pelvic structures, but these may be fibroids within the uterine wall. Musculoskeletal. No bony spinal canal stenosis or focal osseous abnormality. Other: None. IMPRESSION: 1. 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 2. Debris within the right mainstem bronchus. Right upper lobe atelectasis. 3. No acute abnormality of the abdomen or pelvis. Matted appearance of the small bowel and colon without dilatation or inflammation. 4. Enhancing nodular foci along the  posterior left pelvis. Exact location is not entirely clear, but these are suspected to be subserosal fibroids of the left uterine fundus. Aortic Atherosclerosis (ICD10-I70.0). Electronically Signed   By: Ulyses Jarred M.D.   On: 03/17/2018 19:14   Ct Abdomen Pelvis W Contrast  Result Date: 03/17/2018 CLINICAL DATA:  Shortness of breath and back pain. Known right lung mass. EXAM: CT ANGIOGRAPHY CHEST CT ABDOMEN AND PELVIS WITH CONTRAST TECHNIQUE: Multidetector CT imaging of the chest was performed using the standard protocol during bolus administration of intravenous contrast. Multiplanar CT image reconstructions and MIPs were obtained to evaluate the vascular anatomy. Multidetector CT imaging of the abdomen and pelvis was performed using the standard protocol during bolus administration of intravenous contrast. CONTRAST:  14m ISOVUE-370 IOPAMIDOL (ISOVUE-370) INJECTION 76% COMPARISON:  Chest CT 12/04/2017 FINDINGS: CTA CHEST FINDINGS Cardiovascular: Contrast injection is sufficient to demonstrate satisfactory opacification of the pulmonary arteries to the segmental level. There is no pulmonary embolus. The main pulmonary artery is within normal limits for size. 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 The inter lobar artery is now also occluded. There is calcific aortic atherosclerosis. No dissection. There is a normal variant aortic arch branching pattern with the brachiocephalic and left common carotid arteries sharing a common origin. Heart size is normal, without pericardial effusion. There are large paraspinal venous collaterals. Mediastinum/Nodes: Right-sided mediastinal fat is effaced by the low right suprahilar mass. Multiple subcentimeter bilateral axillary lymph nodes. Lungs/Pleura: There is debris within the right mainstem bronchus and bronchus intermedius. Right upper lobe bronchus is occluded. There is a large  suprahilar right lung mass that appears unchanged in size. The mass is difficult to separate from the surrounding lung. 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 Increased confluent nodularity in the left lower lobe. Clustered nodules in the left upper lobe have increased in size, measuring up to 12 mm. Musculoskeletal: No chest wall abnormality. No acute or significant osseous findings. Review of the MIP images confirms the above findings. CT ABDOMEN and PELVIS FINDINGS Hepatobiliary: Normal hepatic contours and density. No visible biliary dilatation. Normal gallbladder. Pancreas: Normal contours without ductal dilatation. No peripancreatic fluid collection. Spleen: Normal. Adrenals/Urinary Tract: Normal adrenal glands. 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 Stomach/Bowel: Matted bowel in the lower abdomen and pelvis. No dilatation or inflammation visualized. Vascular/Lymphatic: Atherosclerotic calcification is present within the non-aneurysmal abdominal aorta, without hemodynamically significant stenosis. Major abdominal veins are patent. No abdominal or pelvic lymphadenopathy. Reproductive: There are multiple enhancing nodules within the posterior left pelvis. Difficult  to accurately localize these lesions due to crowding pelvic structures, but these may be fibroids within the uterine wall. Musculoskeletal. No bony spinal canal stenosis or focal osseous abnormality. Other: None. IMPRESSION: 1. 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 2. Debris within the right mainstem bronchus. Right upper lobe atelectasis. 3. No acute abnormality of the abdomen or pelvis. Matted appearance of the small bowel and colon without dilatation or  inflammation. 4. Enhancing nodular foci along the posterior left pelvis. Exact location is not entirely clear, but these are suspected to be subserosal fibroids of the left uterine fundus. Aortic Atherosclerosis (ICD10-I70.0). Electronically Signed   By: Ulyses Jarred M.D.   On: 03/17/2018 19:14    Pending Labs Unresulted Labs (From admission, onward)    Start     Ordered   03/17/18 2006  APTT  STAT,   R     03/17/18 2010   03/17/18 2006  Protime-INR  STAT,   R     03/17/18 2010          Vitals/Pain Today's Vitals   03/17/18 1745 03/17/18 1800 03/17/18 1918 03/17/18 1930  BP:  (!) 161/111 (!) 143/81 (!) 147/86  Pulse: 95 93 93 92  Resp: 19 16 (!) 25 (!) 23  Temp:      TempSrc:      SpO2: 96% 98% 95% 94%  Weight:      Height:      PainSc:        Isolation Precautions No active isolations  Medications Medications  sodium chloride 0.9 % injection (has no administration in time range)  iopamidol (ISOVUE-370) 76 % injection (has no administration in time range)  heparin bolus via infusion 4,000 Units (has no administration in time range)    Followed by  heparin ADULT infusion 100 units/mL (25000 units/257m sodium chloride 0.45%) (has no administration in time range)  sodium chloride 0.9 % bolus 1,000 mL (0 mLs Intravenous Stopped 03/17/18 1822)  iopamidol (ISOVUE-370) 76 % injection 100 mL (100 mLs Intravenous Contrast Given 03/17/18 1808)    Mobility walks with person assist

## 2018-03-17 NOTE — ED Notes (Signed)
Pt is drinking ice water

## 2018-03-17 NOTE — ED Provider Notes (Signed)
[facility] DEPT Provider Note   CSN: [id number] Arrival date & time: 03/17/18  1537     History   Chief Complaint Chief Complaint  Patient presents with  . Shortness of Breath    HPI Sonya [name] is a 62 y.o. female history of right lung mass here presenting with shortness of breath, cough.  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  Patient states that she did not get biopsy or follow-up with her doctor regarding this matter.  She still smokes cigarettes.  She states that about 2 weeks ago, she has progressive worsening shortness of breath.  She states that she has nonproductive cough but has shortness of breath mainly when she walks.  She states that she got so short of breath she was unable to smoke for the last 2 weeks.  She denies any leg pain or history of blood clots. Patient also had unintentional weight loss over the last several months.   The history is provided by the patient.    Past Medical History:  Diagnosis Date  . Bronchitis   . Chronic back pain   . Scoliosis     Patient Active Problem List   Diagnosis Date Noted  . Accelerated hypertension 06/20/2015  . Tobacco abuse 06/20/2015  . Chronic back pain     History reviewed. No pertinent surgical history.   OB History   None      Home Medications    Prior to Admission medications   Medication Sig Start Date End Date Taking? Authorizing Provider  ibuprofen (ADVIL,MOTRIN) 200 MG tablet Take 200-400 mg by mouth every 6 (six) hours as needed for headache or moderate pain.   Yes [provider]  Multiple Vitamin (MULTIVITAMIN WITH MINERALS) TABS tablet Take 1 tablet by mouth daily.   Yes [provider]  oxyCODONE-acetaminophen (PERCOCET/ROXICET) 5-325 MG tablet Take 1 tablet by mouth every 8 (eight) hours as needed for severe pain. Patient not taking: Reported on 03/17/2018 12/04/17   Joanne Gavel, PA-C     Family History History reviewed. No pertinent family history.  Social History Social History   Tobacco Use  . Smoking status: Current Every Day Smoker    Packs/day: 0.25  . Smokeless tobacco: Never Used  Substance Use Topics  . Alcohol use: Not Currently  . Drug use: No     Allergies   Patient has no known allergies.   Review of Systems Review of Systems  Respiratory: Positive for shortness of breath.   All other systems reviewed and are negative.    Physical Exam Updated Vital Signs BP (!) 143/81   Pulse 93   Temp 97.8 F (36.6 C) (Oral)   Resp (!) 25   Ht 5\' 5"  (1.651 m)   Wt 56.7 kg   SpO2 95%   BMI 20.80 kg/m   Physical Exam  Constitutional: She is oriented to person, place, and time.  Chronically ill. Cachetic   HENT:  Head: Normocephalic.  Mouth/Throat: Oropharynx is clear and moist.  Eyes: Pupils are equal, round, and reactive to light. EOM are normal.  Neck: Normal range of motion. Neck supple.  Cardiovascular: Normal rate.  Pulmonary/Chest:  Tachypneic, crackles R upper lung field. No obvious wheezing. No retractions.   Abdominal: Soft. Bowel sounds are normal.  Musculoskeletal: Normal range of motion.       Right lower leg: Normal. She exhibits no edema.       Left  lower leg: Normal.  Neurological: She is alert and oriented to person, place, and time.  Skin: Skin is warm. Capillary refill takes less than 2 seconds.  Psychiatric: She has a normal mood and affect. Her behavior is normal.  Nursing note and vitals reviewed.    ED Treatments / Results  Labs (all labs ordered are listed, but only abnormal results are displayed) Labs Reviewed  CBC WITH DIFFERENTIAL/PLATELET - Abnormal; Notable for the following components:      Result Value   RBC 2.96 (*)    Hemoglobin 9.0 (*)    HCT 28.0 (*)    All other components within normal limits  COMPREHENSIVE METABOLIC PANEL - Abnormal; Notable for the following components:   Sodium 134 (*)      Glucose, Bld 107 (*)    Calcium 11.4 (*)    Albumin 2.9 (*)    AST 12 (*)    All other components within normal limits  I-STAT TROPONIN, ED    EKG EKG Interpretation  Date/Time:  Tuesday March 17 2018 15:59:24 EDT Ventricular Rate:  105 PR Interval:    QRS Duration: 76 QT Interval:  327 QTC Calculation: 433 R Axis:   2 Text Interpretation:  Sinus tachycardia Borderline prolonged PR interval Biatrial enlargement Nonspecific T abnormalities, lateral leads No significant change since last tracing Confirmed by Wandra Arthurs [phone]) on 03/17/2018 4:42:11 PM   Radiology Dg Chest 2 View  Result Date: 03/17/2018 CLINICAL DATA:  Shortness of breath EXAM: CHEST - 2 VIEW COMPARISON:  Chest CT 12/04/2017 FINDINGS: Consolidation of the right upper lobe consistent with known upper lobe mass and likely postobstructive atelectasis. No pleural effusion. Heart size normal. The left lung is clear. IMPRESSION: Known right suprahilar mass with associated upper lobe atelectasis. Lungs are otherwise clear. Electronically Signed   By: Ulyses Jarred M.D.   On: 03/17/2018 18:01   Ct Angio Chest Pe W And/or Wo Contrast  Result Date: 03/17/2018 CLINICAL DATA:  Shortness of breath and back pain. Known right lung mass. EXAM: CT ANGIOGRAPHY CHEST CT ABDOMEN AND PELVIS WITH CONTRAST TECHNIQUE: Multidetector CT imaging of the chest was performed using the standard protocol during bolus administration of intravenous contrast. Multiplanar CT image reconstructions and MIPs were obtained to evaluate the vascular anatomy. Multidetector CT imaging of the abdomen and pelvis was performed using the standard protocol during bolus administration of intravenous contrast. CONTRAST:  162mL ISOVUE-370 IOPAMIDOL (ISOVUE-370) INJECTION 76% COMPARISON:  Chest CT 12/04/2017 FINDINGS: CTA CHEST FINDINGS Cardiovascular: Contrast injection is sufficient to demonstrate satisfactory opacification of the pulmonary arteries to the  segmental level. There is no pulmonary embolus. The main pulmonary artery is within normal limits for size. 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 The inter lobar artery is now also occluded. There is calcific aortic atherosclerosis. No dissection. There is a normal variant aortic arch branching pattern with the brachiocephalic and left common carotid arteries sharing a common origin. Heart size is normal, without pericardial effusion. There are large paraspinal venous collaterals. Mediastinum/Nodes: Right-sided mediastinal fat is effaced by the low right suprahilar mass. Multiple subcentimeter bilateral axillary lymph nodes. Lungs/Pleura: There is debris within the right mainstem bronchus and bronchus intermedius. Right upper lobe bronchus is occluded. There is a large suprahilar right lung mass that appears unchanged in size. The mass is difficult to separate from the surrounding lung. 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 Increased  confluent nodularity in the left lower lobe. Clustered nodules in the left upper lobe have increased in size, measuring up to 12 mm. Musculoskeletal: No chest wall abnormality. No acute or significant osseous findings. Review of the MIP images confirms the above findings. CT ABDOMEN and PELVIS FINDINGS Hepatobiliary: Normal hepatic contours and density. No visible biliary dilatation. Normal gallbladder. Pancreas: Normal contours without ductal dilatation. No peripancreatic fluid collection. Spleen: Normal. Adrenals/Urinary Tract: Normal adrenal glands. 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 Stomach/Bowel: Matted bowel in the lower abdomen and pelvis. No dilatation or inflammation visualized. Vascular/Lymphatic: Atherosclerotic calcification is present within the non-aneurysmal abdominal aorta, without hemodynamically significant stenosis. Major abdominal veins  are patent. No abdominal or pelvic lymphadenopathy. Reproductive: There are multiple enhancing nodules within the posterior left pelvis. Difficult to accurately localize these lesions due to crowding pelvic structures, but these may be fibroids within the uterine wall. Musculoskeletal. No bony spinal canal stenosis or focal osseous abnormality. Other: None. IMPRESSION: 1. 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 2. Debris within the right mainstem bronchus. Right upper lobe atelectasis. 3. No acute abnormality of the abdomen or pelvis. Matted appearance of the small bowel and colon without dilatation or inflammation. 4. Enhancing nodular foci along the posterior left pelvis. Exact location is not entirely clear, but these are suspected to be subserosal fibroids of the left uterine fundus. Aortic Atherosclerosis (ICD10-I70.0). Electronically Signed   By: Ulyses Jarred M.D.   On: 03/17/2018 19:14   Ct Abdomen Pelvis W Contrast  Result Date: 03/17/2018 CLINICAL DATA:  Shortness of breath and back pain. Known right lung mass. EXAM: CT ANGIOGRAPHY CHEST CT ABDOMEN AND PELVIS WITH CONTRAST TECHNIQUE: Multidetector CT imaging of the chest was performed using the standard protocol during bolus administration of intravenous contrast. Multiplanar CT image reconstructions and MIPs were obtained to evaluate the vascular anatomy. Multidetector CT imaging of the abdomen and pelvis was performed using the standard protocol during bolus administration of intravenous contrast. CONTRAST:  182mL ISOVUE-370 IOPAMIDOL (ISOVUE-370) INJECTION 76% COMPARISON:  Chest CT 12/04/2017 FINDINGS: CTA CHEST FINDINGS Cardiovascular: Contrast injection is sufficient to demonstrate satisfactory  opacification of the pulmonary arteries to the segmental level. There is no pulmonary embolus. The main pulmonary artery is within normal limits for size. 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 The inter lobar artery is now also occluded. There is calcific aortic atherosclerosis. No dissection. There is a normal variant aortic arch branching pattern with the brachiocephalic and left common carotid arteries sharing a common origin. Heart size is normal, without pericardial effusion. There are large paraspinal venous collaterals. Mediastinum/Nodes: Right-sided mediastinal fat is effaced by the low right suprahilar mass. Multiple subcentimeter bilateral axillary lymph nodes. Lungs/Pleura: There is debris within the right mainstem bronchus and bronchus intermedius. Right upper lobe bronchus is occluded. There is a large suprahilar right lung mass that appears unchanged in size. The mass is difficult to separate from the surrounding lung. 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 Increased confluent nodularity in the left lower lobe. Clustered nodules in the left upper lobe have increased in size, measuring up to 12 mm. Musculoskeletal: No chest wall abnormality. No acute or significant osseous findings. Review of the MIP images  confirms the above findings. CT ABDOMEN and PELVIS FINDINGS Hepatobiliary: Normal hepatic contours and density. No visible biliary dilatation. Normal gallbladder. Pancreas: Normal contours without ductal dilatation. No peripancreatic fluid collection. Spleen: Normal. Adrenals/Urinary Tract: Normal adrenal glands. 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 Stomach/Bowel: Matted bowel in the lower abdomen and pelvis. No dilatation or inflammation visualized. Vascular/Lymphatic: Atherosclerotic calcification is present within the non-aneurysmal abdominal aorta, without  hemodynamically significant stenosis. Major abdominal veins are patent. No abdominal or pelvic lymphadenopathy. Reproductive: There are multiple enhancing nodules within the posterior left pelvis. Difficult to accurately localize these lesions due to crowding pelvic structures, but these may be fibroids within the uterine wall. Musculoskeletal. No bony spinal canal stenosis or focal osseous abnormality. Other: None. IMPRESSION: 1. 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 2. Debris within the right mainstem bronchus. Right upper lobe atelectasis. 3. No acute abnormality of the abdomen or pelvis. Matted appearance of the small bowel and colon without dilatation or inflammation. 4. Enhancing nodular foci along the posterior left pelvis. Exact location is not entirely clear, but these are suspected to be subserosal fibroids of the left uterine fundus. Aortic Atherosclerosis (ICD10-I70.0). Electronically Signed   By: Ulyses Jarred M.D.   On: 03/17/2018 19:14    Procedures Procedures (including critical care time)  CRITICAL CARE Performed by: Wandra Arthurs   Total critical care time: 30 minutes  Critical care time was exclusive of separately billable procedures and treating other patients.  Critical care was necessary to treat or prevent imminent or life-threatening deterioration.  Critical care was time spent personally by me on the following activities: development of treatment plan with patient and/or surrogate as well as nursing, discussions with consultants, evaluation of patient's response to treatment, examination of patient, obtaining history from patient or surrogate, ordering and performing treatments and interventions, ordering and review of  laboratory studies, ordering and review of radiographic studies, pulse oximetry and re-evaluation of patient's condition.   Medications Ordered in ED Medications  sodium chloride 0.9 % injection (has no administration in time range)  iopamidol (ISOVUE-370) 76 % injection (has no administration in time range)  sodium chloride 0.9 % bolus 1,000 mL (0 mLs Intravenous Stopped 03/17/18 1822)  iopamidol (ISOVUE-370) 76 % injection 100 mL (100 mLs Intravenous Contrast Given 03/17/18 1808)     Initial Impression / Assessment and Plan / ED Course  I have reviewed the triage vital signs and the nursing notes.  Pertinent labs & imaging results that were available during my care of the patient were reviewed by me and considered in my medical decision making (see chart for details).     Teona Vargus is a 62 y.o. female here with SOB. Recent diagnosis of R lung mass but hasn't been following up. Patient is tachycardic so consider PE from the lung mass vs increasing size of the mass. Will get CTA chest, CT ab/pel (given weight loss). Will likely need admission for further workup of the mass.   7:50 PM CTA showed enlarging mass with occlusion R middle lobe pulmonary artery. I called Dr. Jana Hakim, oncologist, regarding treatment options. He recommend IV heparin, IR biopsy in AM. Will admit to hospitalist service for pulmonary artery occlusion secondary to lung mass    Final Clinical Impressions(s) / ED Diagnoses   Final diagnoses:  None    ED Discharge Orders    None       Drenda Freeze, MD 03/17/18 2009

## 2018-03-18 ENCOUNTER — Inpatient Hospital Stay (HOSPITAL_COMMUNITY): Payer: Medicaid Other

## 2018-03-18 ENCOUNTER — Other Ambulatory Visit: Payer: Self-pay

## 2018-03-18 DIAGNOSIS — C3411 Malignant neoplasm of upper lobe, right bronchus or lung: Secondary | ICD-10-CM | POA: Diagnosis present

## 2018-03-18 DIAGNOSIS — I82623 Acute embolism and thrombosis of deep veins of upper extremity, bilateral: Secondary | ICD-10-CM | POA: Diagnosis not present

## 2018-03-18 DIAGNOSIS — Z7189 Other specified counseling: Secondary | ICD-10-CM | POA: Diagnosis not present

## 2018-03-18 DIAGNOSIS — R64 Cachexia: Secondary | ICD-10-CM | POA: Diagnosis present

## 2018-03-18 DIAGNOSIS — I160 Hypertensive urgency: Secondary | ICD-10-CM | POA: Diagnosis not present

## 2018-03-18 DIAGNOSIS — I4891 Unspecified atrial fibrillation: Secondary | ICD-10-CM | POA: Diagnosis not present

## 2018-03-18 DIAGNOSIS — I270 Primary pulmonary hypertension: Secondary | ICD-10-CM

## 2018-03-18 DIAGNOSIS — M7989 Other specified soft tissue disorders: Secondary | ICD-10-CM | POA: Diagnosis not present

## 2018-03-18 DIAGNOSIS — I82A12 Acute embolism and thrombosis of left axillary vein: Secondary | ICD-10-CM | POA: Diagnosis not present

## 2018-03-18 DIAGNOSIS — R918 Other nonspecific abnormal finding of lung field: Secondary | ICD-10-CM | POA: Diagnosis present

## 2018-03-18 DIAGNOSIS — Z9119 Patient's noncompliance with other medical treatment and regimen: Secondary | ICD-10-CM | POA: Diagnosis not present

## 2018-03-18 DIAGNOSIS — Z515 Encounter for palliative care: Secondary | ICD-10-CM | POA: Diagnosis not present

## 2018-03-18 DIAGNOSIS — D649 Anemia, unspecified: Secondary | ICD-10-CM

## 2018-03-18 DIAGNOSIS — E8770 Fluid overload, unspecified: Secondary | ICD-10-CM | POA: Diagnosis not present

## 2018-03-18 DIAGNOSIS — T380X5A Adverse effect of glucocorticoids and synthetic analogues, initial encounter: Secondary | ICD-10-CM | POA: Diagnosis not present

## 2018-03-18 DIAGNOSIS — I871 Compression of vein: Secondary | ICD-10-CM | POA: Diagnosis present

## 2018-03-18 DIAGNOSIS — E876 Hypokalemia: Secondary | ICD-10-CM | POA: Diagnosis not present

## 2018-03-18 DIAGNOSIS — I82A11 Acute embolism and thrombosis of right axillary vein: Secondary | ICD-10-CM | POA: Diagnosis not present

## 2018-03-18 DIAGNOSIS — J9811 Atelectasis: Secondary | ICD-10-CM | POA: Diagnosis present

## 2018-03-18 DIAGNOSIS — Z681 Body mass index (BMI) 19 or less, adult: Secondary | ICD-10-CM | POA: Diagnosis not present

## 2018-03-18 DIAGNOSIS — I82A13 Acute embolism and thrombosis of axillary vein, bilateral: Secondary | ICD-10-CM | POA: Diagnosis not present

## 2018-03-18 DIAGNOSIS — T502X5A Adverse effect of carbonic-anhydrase inhibitors, benzothiadiazides and other diuretics, initial encounter: Secondary | ICD-10-CM | POA: Diagnosis not present

## 2018-03-18 DIAGNOSIS — G8929 Other chronic pain: Secondary | ICD-10-CM | POA: Diagnosis present

## 2018-03-18 DIAGNOSIS — I5030 Unspecified diastolic (congestive) heart failure: Secondary | ICD-10-CM | POA: Diagnosis not present

## 2018-03-18 DIAGNOSIS — F17210 Nicotine dependence, cigarettes, uncomplicated: Secondary | ICD-10-CM | POA: Diagnosis present

## 2018-03-18 DIAGNOSIS — I10 Essential (primary) hypertension: Secondary | ICD-10-CM | POA: Diagnosis present

## 2018-03-18 DIAGNOSIS — D72829 Elevated white blood cell count, unspecified: Secondary | ICD-10-CM | POA: Diagnosis not present

## 2018-03-18 DIAGNOSIS — J441 Chronic obstructive pulmonary disease with (acute) exacerbation: Secondary | ICD-10-CM | POA: Diagnosis present

## 2018-03-18 DIAGNOSIS — I288 Other diseases of pulmonary vessels: Secondary | ICD-10-CM | POA: Diagnosis present

## 2018-03-18 DIAGNOSIS — M549 Dorsalgia, unspecified: Secondary | ICD-10-CM | POA: Diagnosis present

## 2018-03-18 LAB — COMPREHENSIVE METABOLIC PANEL
ALT: 8 U/L (ref 0–44)
AST: 11 U/L — ABNORMAL LOW (ref 15–41)
Albumin: 3 g/dL — ABNORMAL LOW (ref 3.5–5.0)
Alkaline Phosphatase: 64 U/L (ref 38–126)
Anion gap: 7 (ref 5–15)
BUN: 8 mg/dL (ref 8–23)
CO2: 24 mmol/L (ref 22–32)
CREATININE: 0.64 mg/dL (ref 0.44–1.00)
Calcium: 10.7 mg/dL — ABNORMAL HIGH (ref 8.9–10.3)
Chloride: 101 mmol/L (ref 98–111)
Glucose, Bld: 101 mg/dL — ABNORMAL HIGH (ref 70–99)
Potassium: 3.5 mmol/L (ref 3.5–5.1)
SODIUM: 132 mmol/L — AB (ref 135–145)
Total Bilirubin: 0.5 mg/dL (ref 0.3–1.2)
Total Protein: 7.7 g/dL (ref 6.5–8.1)

## 2018-03-18 LAB — CBC
HCT: 27.4 % — ABNORMAL LOW (ref 36.0–46.0)
Hemoglobin: 8.6 g/dL — ABNORMAL LOW (ref 12.0–15.0)
MCH: 30 pg (ref 26.0–34.0)
MCHC: 31.4 g/dL (ref 30.0–36.0)
MCV: 95.5 fL (ref 80.0–100.0)
Platelets: 310 10*3/uL (ref 150–400)
RBC: 2.87 MIL/uL — AB (ref 3.87–5.11)
RDW: 13.4 % (ref 11.5–15.5)
WBC: 9 10*3/uL (ref 4.0–10.5)
nRBC: 0 % (ref 0.0–0.2)

## 2018-03-18 LAB — HEPARIN LEVEL (UNFRACTIONATED): HEPARIN UNFRACTIONATED: 0.21 [IU]/mL — AB (ref 0.30–0.70)

## 2018-03-18 LAB — IRON AND TIBC
Iron: 14 ug/dL — ABNORMAL LOW (ref 28–170)
SATURATION RATIOS: 7 % — AB (ref 10.4–31.8)
TIBC: 213 ug/dL — ABNORMAL LOW (ref 250–450)
UIBC: 199 ug/dL

## 2018-03-18 LAB — HIV ANTIBODY (ROUTINE TESTING W REFLEX): HIV Screen 4th Generation wRfx: NONREACTIVE

## 2018-03-18 LAB — MAGNESIUM: Magnesium: 1.7 mg/dL (ref 1.7–2.4)

## 2018-03-18 LAB — PHOSPHORUS: Phosphorus: 2.2 mg/dL — ABNORMAL LOW (ref 2.5–4.6)

## 2018-03-18 LAB — FERRITIN: Ferritin: 103 ng/mL (ref 11–307)

## 2018-03-18 MED ORDER — HYDROCOD POLST-CPM POLST ER 10-8 MG/5ML PO SUER
5.0000 mL | Freq: Once | ORAL | Status: AC
  Administered 2018-03-19: 5 mL via ORAL
  Filled 2018-03-18: qty 5

## 2018-03-18 MED ORDER — MIDAZOLAM HCL 2 MG/2ML IJ SOLN
INTRAMUSCULAR | Status: AC | PRN
  Administered 2018-03-18 (×2): 1 mg via INTRAVENOUS

## 2018-03-18 MED ORDER — MAGNESIUM SULFATE 4 GM/100ML IV SOLN
4.0000 g | Freq: Once | INTRAVENOUS | Status: AC
  Administered 2018-03-18: 4 g via INTRAVENOUS
  Filled 2018-03-18: qty 100

## 2018-03-18 MED ORDER — POTASSIUM CHLORIDE CRYS ER 20 MEQ PO TBCR
40.0000 meq | EXTENDED_RELEASE_TABLET | Freq: Once | ORAL | Status: AC
  Administered 2018-03-18: 40 meq via ORAL
  Filled 2018-03-18: qty 2

## 2018-03-18 MED ORDER — FLUMAZENIL 0.5 MG/5ML IV SOLN
INTRAVENOUS | Status: AC
  Filled 2018-03-18: qty 5

## 2018-03-18 MED ORDER — HYDRALAZINE HCL 20 MG/ML IJ SOLN
10.0000 mg | Freq: Four times a day (QID) | INTRAMUSCULAR | Status: DC | PRN
  Administered 2018-03-18 – 2018-03-24 (×3): 10 mg via INTRAVENOUS
  Filled 2018-03-18 (×4): qty 1

## 2018-03-18 MED ORDER — HEPARIN (PORCINE) IN NACL 100-0.45 UNIT/ML-% IJ SOLN
1100.0000 [IU]/h | INTRAMUSCULAR | Status: DC
  Administered 2018-03-18: 1100 [IU]/h via INTRAVENOUS

## 2018-03-18 MED ORDER — ENOXAPARIN SODIUM 40 MG/0.4ML ~~LOC~~ SOLN
40.0000 mg | SUBCUTANEOUS | Status: DC
  Administered 2018-03-18 – 2018-03-23 (×6): 40 mg via SUBCUTANEOUS
  Filled 2018-03-18 (×6): qty 0.4

## 2018-03-18 MED ORDER — FENTANYL CITRATE (PF) 100 MCG/2ML IJ SOLN
INTRAMUSCULAR | Status: AC
  Filled 2018-03-18: qty 4

## 2018-03-18 MED ORDER — ENOXAPARIN (LOVENOX) PATIENT EDUCATION KIT
PACK | Freq: Once | Status: DC
  Filled 2018-03-18: qty 1

## 2018-03-18 MED ORDER — HYDROCOD POLST-CPM POLST ER 10-8 MG/5ML PO SUER
5.0000 mL | Freq: Once | ORAL | Status: AC
  Administered 2018-03-18: 5 mL via ORAL
  Filled 2018-03-18: qty 5

## 2018-03-18 MED ORDER — LISINOPRIL 20 MG PO TABS
20.0000 mg | ORAL_TABLET | Freq: Every day | ORAL | Status: DC
  Administered 2018-03-18 – 2018-03-19 (×2): 20 mg via ORAL
  Filled 2018-03-18 (×2): qty 1

## 2018-03-18 MED ORDER — MIDAZOLAM HCL 2 MG/2ML IJ SOLN
INTRAMUSCULAR | Status: AC
  Filled 2018-03-18: qty 2

## 2018-03-18 MED ORDER — NALOXONE HCL 0.4 MG/ML IJ SOLN
INTRAMUSCULAR | Status: AC
  Filled 2018-03-18: qty 1

## 2018-03-18 MED ORDER — HEPARIN (PORCINE) IN NACL 100-0.45 UNIT/ML-% IJ SOLN
1300.0000 [IU]/h | INTRAMUSCULAR | Status: DC
  Administered 2018-03-18: 1300 [IU]/h via INTRAVENOUS

## 2018-03-18 MED ORDER — FENTANYL CITRATE (PF) 100 MCG/2ML IJ SOLN
INTRAMUSCULAR | Status: AC | PRN
  Administered 2018-03-18: 50 ug via INTRAVENOUS

## 2018-03-18 MED ORDER — AMLODIPINE BESYLATE 10 MG PO TABS
10.0000 mg | ORAL_TABLET | Freq: Every day | ORAL | Status: DC
  Administered 2018-03-18 – 2018-03-20 (×3): 10 mg via ORAL
  Filled 2018-03-18 (×3): qty 1

## 2018-03-18 NOTE — Progress Notes (Signed)
Oncology Short Note  Consult was received from Dr Grandville Silos. Dr Earlie Server has been kind enough to agree to see this patient tomorrow for lung mass likely lung cancer and for continued outpatient f/u for Mx. Thoracic cancer navigator -Norton Blizzard is aware of patient for care co-ordination. Biopsy is pending for tissue diagnosis.  Sullivan Lone MD MS

## 2018-03-18 NOTE — Progress Notes (Signed)
Patient suffers from lung cancer which impairs their ability to perform daily activities like walking in the home.  A walker alone will not resolve the issues with performing activities of daily living. A wheelchair will allow patient to safely perform daily activities.  The patient can self propel in the home or has a caregiver who can provide assistance.     Blondell Reveal Kistler PT 03/18/2018  Acute Rehabilitation Services Pager [phone] Office [phone]

## 2018-03-18 NOTE — Consult Note (Signed)
Chief Complaint: Patient was seen in consultation today for CT-guided right lung mass biopsy Chief Complaint  Patient presents with  . Shortness of Breath    Referring Physician(s): Thompson,D  Supervising Physician: Corrie Mckusick  Patient Status: [facility] - In-pt  History of Present Illness: Sonya [name] is a 62 y.o. female, ex-smoker, with no known history of malignancy and evidence of right lung mass since July of this year (patient did not pursue follow-up recommendations) who recently presented to [facility] with persistent dyspnea, weight loss and cough.  Follow-up CT yesterday revealed:  1. 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 2. Debris within the right mainstem bronchus. Right upper lobe atelectasis. 3. No acute abnormality of the abdomen or pelvis. Matted appearance of the small bowel and colon without dilatation or inflammation. 4. Enhancing nodular foci along the posterior left pelvis. Exact location is not entirely clear, but these are suspected to be subserosal fibroids of the left uterine fundus  Request now received for image guided right lung mass biopsy for further evaluation.  Pulmonology felt that patient was not appropriate candidate for bronchoscopy.  Past Medical History:  Diagnosis Date  . Bronchitis   . Chronic back pain   . Scoliosis     History reviewed. No pertinent surgical history.  Allergies: Patient has no known allergies.  Medications: Prior to Admission medications   Medication Sig Start Date End Date Taking? Authorizing Provider  ibuprofen (ADVIL,MOTRIN) 200 MG tablet Take 200-400 mg by mouth every 6 (six) hours as needed for headache or moderate pain.   Yes  [provider]  Multiple Vitamin (MULTIVITAMIN WITH MINERALS) TABS tablet Take 1 tablet by mouth daily.   Yes [provider]  oxyCODONE-acetaminophen (PERCOCET/ROXICET) 5-325 MG tablet Take 1 tablet by mouth every 8 (eight) hours as needed for severe pain. Patient not taking: Reported on 03/17/2018 12/04/17   Joline Maxcy A, PA-C     History reviewed. No pertinent family history.  Social History   Socioeconomic History  . Marital status: Divorced    Spouse name: Not on file  . Number of children: Not on file  . Years of education: Not on file  . Highest education level: Not on file  Occupational History  . Not on file  Social Needs  . Financial resource strain: Not on file  . Food insecurity:    Worry: Not on file    Inability: Not on file  . Transportation needs:    Medical: Not on file    Non-medical: Not on file  Tobacco Use  . Smoking status: Current Every Day Smoker    Packs/day: 0.25  . Smokeless tobacco: Never Used  Substance and Sexual Activity  . Alcohol use: Not Currently  . Drug use: No  . Sexual activity: Not on file  Lifestyle  . Physical activity:    Days per week: Not on file    Minutes per session: Not on file  . Stress: Not on file  Relationships  . Social connections:    Talks on phone: Not on file    Gets together: Not on file    Attends religious service: Not on file    Active member of club or organization: Not on file    Attends meetings of clubs or  organizations: Not on file    Relationship status: Not on file  Other Topics Concern  . Not on file  Social History Narrative  . Not on file        Review of Systems see above; denies fever, headache, chest pain, abdominal pain, nausea, vomiting or bleeding.  Vital Signs: BP (!) 152/81 (BP Location: Left Arm)   Pulse 89   Temp 98.2 F (36.8 C) (Oral)   Resp 18   Ht 5\' 9"  (1.753 m)   Wt 104 lb (47.2 kg)   SpO2 97%   BMI 15.36 kg/m   Physical Exam awake, alert.   Chest with distant breath sounds bilaterally.  Heart with regular rate and rhythm.  Abdomen soft, positive bowel sounds, nontender.  No lower extremity edema.  Imaging: Dg Chest 2 View  Result Date: 03/17/2018 CLINICAL DATA:  Shortness of breath EXAM: CHEST - 2 VIEW COMPARISON:  Chest CT 12/04/2017 FINDINGS: Consolidation of the right upper lobe consistent with known upper lobe mass and likely postobstructive atelectasis. No pleural effusion. Heart size normal. The left lung is clear. IMPRESSION: Known right suprahilar mass with associated upper lobe atelectasis. Lungs are otherwise clear. Electronically Signed   By: Ulyses Jarred M.D.   On: 03/17/2018 18:01   Ct Angio Chest Pe W And/or Wo Contrast  Result Date: 03/17/2018 CLINICAL DATA:  Shortness of breath and back pain. Known right lung mass. EXAM: CT ANGIOGRAPHY CHEST CT ABDOMEN AND PELVIS WITH CONTRAST TECHNIQUE: Multidetector CT imaging of the chest was performed using the standard protocol during bolus administration of intravenous contrast. Multiplanar CT image reconstructions and MIPs were obtained to evaluate the vascular anatomy. Multidetector CT imaging of the abdomen and pelvis was performed using the standard protocol during bolus administration of intravenous contrast. CONTRAST:  122mL ISOVUE-370 IOPAMIDOL (ISOVUE-370) INJECTION 76% COMPARISON:  Chest CT 12/04/2017 FINDINGS: CTA CHEST FINDINGS Cardiovascular: Contrast injection is sufficient to demonstrate satisfactory opacification of the pulmonary arteries to the segmental level. There is no pulmonary embolus. The main pulmonary artery is within normal limits for size. 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 The inter lobar artery is now also occluded. There is calcific aortic atherosclerosis. No dissection. There is a normal variant aortic arch branching pattern with the brachiocephalic and left common  carotid arteries sharing a common origin. Heart size is normal, without pericardial effusion. There are large paraspinal venous collaterals. Mediastinum/Nodes: Right-sided mediastinal fat is effaced by the low right suprahilar mass. Multiple subcentimeter bilateral axillary lymph nodes. Lungs/Pleura: There is debris within the right mainstem bronchus and bronchus intermedius. Right upper lobe bronchus is occluded. There is a large suprahilar right lung mass that appears unchanged in size. The mass is difficult to separate from the surrounding lung. 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 Increased confluent nodularity in the left lower lobe. Clustered nodules in the left upper lobe have increased in size, measuring up to 12 mm. Musculoskeletal: No chest wall abnormality. No acute or significant osseous findings. Review of the MIP images confirms the above findings. CT ABDOMEN and PELVIS FINDINGS Hepatobiliary: Normal hepatic contours and density. No visible biliary dilatation. Normal gallbladder. Pancreas: Normal contours without ductal dilatation. No peripancreatic fluid collection. Spleen: Normal. Adrenals/Urinary Tract: Normal adrenal glands. 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 Stomach/Bowel: Matted bowel in the lower abdomen and pelvis. No dilatation or inflammation visualized. Vascular/Lymphatic: Atherosclerotic calcification is present within the non-aneurysmal abdominal  aorta, without hemodynamically significant stenosis. Major abdominal veins are patent. No abdominal or pelvic lymphadenopathy. Reproductive: There are multiple enhancing nodules within the posterior left pelvis. Difficult to accurately localize these lesions due to crowding pelvic structures, but these may be fibroids within the uterine wall. Musculoskeletal. No bony spinal canal stenosis or focal osseous abnormality. Other: None. IMPRESSION: 1. 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 2. Debris within the right mainstem bronchus. Right upper lobe atelectasis. 3. No acute abnormality of the abdomen or pelvis. Matted appearance of the small bowel and colon without dilatation or inflammation. 4. Enhancing nodular foci along the posterior left pelvis. Exact location is not entirely clear, but these are suspected to be subserosal fibroids of the left uterine fundus. Aortic Atherosclerosis (ICD10-I70.0). Electronically Signed   By: Ulyses Jarred M.D.   On: 03/17/2018 19:14   Ct Abdomen Pelvis W Contrast  Result Date: 03/17/2018 CLINICAL DATA:  Shortness of breath and back pain. Known right lung mass. EXAM: CT ANGIOGRAPHY CHEST CT ABDOMEN AND PELVIS WITH CONTRAST TECHNIQUE: Multidetector CT imaging of the chest was performed using the standard protocol during bolus administration of intravenous contrast. Multiplanar CT image reconstructions and MIPs were obtained to evaluate the vascular anatomy. Multidetector CT imaging of the abdomen and pelvis was performed using the standard protocol during bolus administration of intravenous contrast. CONTRAST:  168mL ISOVUE-370 IOPAMIDOL (ISOVUE-370) INJECTION 76% COMPARISON:  Chest CT 12/04/2017 FINDINGS: CTA CHEST FINDINGS Cardiovascular: Contrast injection is sufficient to demonstrate satisfactory opacification of the pulmonary arteries to the segmental level. There is no pulmonary embolus. The main pulmonary artery is within normal limits for size. 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 The inter lobar artery is now also occluded. There is calcific aortic atherosclerosis. No dissection. There is a normal variant aortic arch branching  pattern with the brachiocephalic and left common carotid arteries sharing a common origin. Heart size is normal, without pericardial effusion. There are large paraspinal venous collaterals. Mediastinum/Nodes: Right-sided mediastinal fat is effaced by the low right suprahilar mass. Multiple subcentimeter bilateral axillary lymph nodes. Lungs/Pleura: There is debris within the right mainstem bronchus and bronchus intermedius. Right upper lobe bronchus is occluded. There is a large suprahilar right lung mass that appears unchanged in size. The mass is difficult to separate from the surrounding lung. 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 Increased confluent nodularity in the left lower lobe. Clustered nodules in the left upper lobe have increased in size, measuring up to 12 mm. Musculoskeletal: No chest wall abnormality. No acute or significant osseous findings. Review of the MIP images confirms the above findings. CT ABDOMEN and PELVIS FINDINGS Hepatobiliary: Normal hepatic contours and density. No visible biliary dilatation. Normal gallbladder. Pancreas: Normal contours without ductal dilatation. No peripancreatic fluid collection. Spleen: Normal. Adrenals/Urinary Tract: Normal adrenal glands. 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 Stomach/Bowel: Matted bowel in the lower abdomen and pelvis. No dilatation or inflammation visualized. Vascular/Lymphatic: Atherosclerotic calcification is present within the non-aneurysmal abdominal aorta, without hemodynamically significant stenosis. Major abdominal veins are patent. No abdominal or pelvic lymphadenopathy. Reproductive: There are multiple enhancing nodules within the posterior left pelvis. Difficult to accurately localize these lesions due to crowding pelvic structures, but these  may be fibroids within the uterine wall. Musculoskeletal. No bony spinal canal stenosis or focal osseous abnormality. Other: None. IMPRESSION: 1. 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 2. Debris within the right mainstem bronchus. Right upper lobe atelectasis. 3. No acute abnormality of the abdomen or pelvis. Matted appearance of the small bowel and colon without dilatation or inflammation. 4. Enhancing nodular foci along the posterior left pelvis. Exact location is not entirely clear, but these are suspected to be subserosal fibroids of the left uterine fundus. Aortic Atherosclerosis (ICD10-I70.0). Electronically Signed   By: Ulyses Jarred M.D.   On: 03/17/2018 19:14    Labs:  CBC: Recent Labs    12/04/17 1509 03/17/18 1700 03/18/18 0256  WBC 9.9 7.4 9.0  HGB 10.0* 9.0* 8.6*  HCT 29.1* 28.0* 27.4*  PLT 419* 306 310    COAGS: Recent Labs    03/17/18 2006  INR 1.16  APTT 28    BMP: Recent Labs    12/04/17 1509 03/17/18 1700 03/18/18 0826  NA 138 134* 132*  K 3.6 3.8 3.5  CL 100 100 101  CO2 26 26 24   GLUCOSE 93 107* 101*  BUN 11 9 8   CALCIUM 9.5 11.4* 10.7*  CREATININE 0.65 0.56 0.64  GFRNONAA >60 >60 >60  GFRAA >60 >60 >60    LIVER FUNCTION TESTS: Recent Labs    12/04/17 1509 03/17/18 1700 03/18/18 0826  BILITOT 0.6 0.6 0.5  AST 14* 12* 11*  ALT 9 8 8   ALKPHOS 71 69 64  PROT 8.5* 7.8 7.7  ALBUMIN 3.2* 2.9* 3.0*    TUMOR MARKERS: No results for input(s): AFPTM, CEA, CA199, CHROMGRNA in the last 8760 hours.  Assessment and Plan: 62 y.o. female, ex-smoker, with no known history of malignancy and evidence of right lung mass since July of this year (patient did not pursue follow-up recommendations) who [facility], weight loss and cough.  Follow-up CT chest/abdomen/pelvis yesterday revealed:  1. 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 2. Debris within the right mainstem bronchus. Right upper lobe atelectasis. 3. No acute abnormality of the abdomen or pelvis. Matted appearance of the small bowel and colon without dilatation or inflammation. 4. Enhancing nodular foci along the posterior left pelvis. Exact location is not entirely clear, but these are suspected to be subserosal fibroids of the left uterine fundus  Request now received for image guided right lung mass biopsy for further evaluation.  Pulmonology felt that patient was not appropriate candidate for bronchoscopy.  Imaging studies have been reviewed by Dr. Earleen Newport.Risks and benefits discussed with the patient including, but not limited to bleeding, infection, pneumothorax requiring chest tube placement, damage to adjacent structures or low yield requiring additional tests and death.   All of the patient's questions were answered, patient is agreeable to proceed. Consent signed and in chart.  Procedure tentatively planned for later this afternoon.  She is currently off IV heparin.   Thank you for this interesting consult.  I greatly enjoyed meeting Rakel Junio and look forward to  participating in their care.  A copy of this report was sent to the requesting provider on this date.  Electronically Signed: D. Rowe Robert, PA-C 03/18/2018, 12:51 PM   I spent a total of 30 minutes    in face to face in clinical consultation, greater than 50% of which was counseling/coordinating care for CT guided right lung mass biopsy

## 2018-03-18 NOTE — Progress Notes (Signed)
Pt only able to complete mri brain without contrast. Pt with inability to lay flat due to lung mass and cough. Best obtainable images. Pt unable to continue post contrast images.

## 2018-03-18 NOTE — Plan of Care (Signed)
  Problem: Nutrition: Goal: Adequate nutrition will be maintained 03/18/2018 1556 by Dorene Sorrow, RN Outcome: Progressing 03/18/2018 1357 by Dorene Sorrow, RN Outcome: Progressing   Problem: Pain Managment: Goal: General experience of comfort will improve 03/18/2018 1556 by Dorene Sorrow, RN Outcome: Progressing 03/18/2018 1357 by Dorene Sorrow, RN Outcome: Progressing   Problem: Elimination: Goal: Will not experience complications related to bowel motility 03/18/2018 1556 by Dorene Sorrow, RN Outcome: Progressing 03/18/2018 1357 by Dorene Sorrow, RN Outcome: Progressing

## 2018-03-18 NOTE — Plan of Care (Signed)
  Problem: Nutrition: Goal: Adequate nutrition will be maintained Outcome: Progressing   Problem: Elimination: Goal: Will not experience complications related to bowel motility Outcome: Progressing   Problem: Pain Managment: Goal: General experience of comfort will improve Outcome: Progressing   

## 2018-03-18 NOTE — Evaluation (Signed)
Physical Therapy Evaluation Patient Details Name: Sonya [name] MRN: [id number] DOB: 1955-06-28 Today's Date: 03/18/2018   History of Present Illness   62 y.o. female with medical history significant of right lung mass presenting to the hospital for evaluation of shortness of breath. 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  Clinical Impression  Pt admitted with above diagnosis. Pt currently with functional limitations due to the deficits listed below (see PT Problem List). Min assist for bed to recliner transfer, pt unable to tolerate further activity 2* SOB. SaO2 100% on room air with activity.  Pt will benefit from skilled PT to increase their independence and safety with mobility to allow discharge to the venue listed below.       Follow Up Recommendations Home health PT;Supervision for mobility/OOB; pt has 24* care available    Equipment Recommendations  Other (comment)(rollator) -may need WC, depending on progress   Recommendations for Other Services       Precautions / Restrictions Precautions Precautions: Fall Precaution Comments: pt denies h/o falls Restrictions Weight Bearing Restrictions: No      Mobility  Bed Mobility Overal bed mobility: Independent                Transfers Overall transfer level: Needs assistance Equipment used: 1 person hand held assist Transfers: Sit to/from [facility] Sit to Stand: Min assist Stand pivot transfers: Min assist       General transfer comment: min/guard to min A for sit to stand then to pivot to recliner, pt reported SOB with this, so ambulation deferred; SaO2 100% on room air with activity  Ambulation/Gait             General Gait Details: deferred  Stairs            Wheelchair Mobility    Modified Rankin (Stroke  Patients Only)       Balance Overall balance assessment: No apparent balance deficits (not formally assessed)                                           Pertinent Vitals/Pain Pain Assessment: No/denies pain    Home Living Family/patient expects to be discharged to:: Private residence Living Arrangements: Other relatives(niece) Available Help at Discharge: Family;Available 24 hours/day Type of Home: House       Home Layout: Multi-level;Bed/bath upstairs Home Equipment: None      Prior Function Level of Independence: Independent               Hand Dominance        Extremity/Trunk Assessment   Upper Extremity Assessment Upper Extremity Assessment: Overall WFL for tasks assessed    Lower Extremity Assessment Lower Extremity Assessment: Overall WFL for tasks assessed    Cervical / Trunk Assessment Cervical / Trunk Assessment: Normal  Communication   Communication: No difficulties  Cognition Arousal/Alertness: Awake/alert Behavior During Therapy: WFL for tasks assessed/performed Overall Cognitive Status: Within Functional Limits for tasks assessed                                        General Comments  Exercises     Assessment/Plan    PT Assessment Patient needs continued PT services  PT Problem List Decreased activity tolerance;Cardiopulmonary status limiting activity       PT Treatment Interventions DME instruction;Gait training;Therapeutic activities;Therapeutic exercise;Patient/family education    PT Goals (Current goals can be found in the Care Plan section)  Acute Rehab PT Goals Patient Stated Goal: hopes to move to 1 level home as 7 steps to her bedroom are a challenge PT Goal Formulation: With patient Time For Goal Achievement: 04/01/18 Potential to Achieve Goals: Good    Frequency Min 3X/week   Barriers to discharge        Co-evaluation               AM-PAC PT "6 Clicks" Daily  Activity  Outcome Measure Difficulty turning over in bed (including adjusting bedclothes, sheets and blankets)?: None Difficulty moving from lying on back to sitting on the side of the bed? : A Little Difficulty sitting down on and standing up from a chair with arms (e.g., wheelchair, bedside commode, etc,.)?: Unable Help needed moving to and from a bed to chair (including a wheelchair)?: A Little Help needed walking in hospital room?: A Little Help needed climbing 3-5 steps with a railing? : Total 6 Click Score: 15    End of Session   Activity Tolerance: Treatment limited secondary to medical complications (Comment)(SOB) Patient left: in chair;with call bell/phone within reach Nurse Communication: Mobility status PT Visit Diagnosis: Difficulty in walking, not elsewhere classified (R26.2)    Time: 6962-9528 PT Time Calculation (min) (ACUTE ONLY): 11 min   Charges:   PT Evaluation $PT Eval Low Complexity: 1 Low          Philomena Doheny PT 03/18/2018  Acute Rehabilitation Services Pager [phone] Office [phone]

## 2018-03-18 NOTE — Progress Notes (Signed)
MEDICATION-RELATED CONSULT NOTE   IR Procedure Consult - Anticoagulant/Antiplatelet PTA/Inpatient Med List Review by Pharmacist   Procedure: CT biopsy of RUL mass    Completed: 10/30 at 1702  Post-Procedural bleeding risk per IR MD assessment:  Low  Antithrombotic medications on inpatient or PTA profile prior to procedure:    Heparin IV infusion, stopped on 10/30 at 10:05 due to CT negative for PE   Recommended restart time per IR Post-Procedure Guidelines:   ([facility]) low molecular weight heparin (prophylactic) Day 0 (at least 4 hours or at next standard dose interval)   Plan:     Lovenox 40 mg Stephens q24h, next dose on 10/30 at 2200 per MD order.  Gretta Arab PharmD, BCPS Pager [phone] 03/18/2018 5:58 PM

## 2018-03-18 NOTE — Progress Notes (Signed)
03/18/18  1709  Patient is on bedrest until 2000 tonight per order for 3hrs of bedrest.

## 2018-03-18 NOTE — Progress Notes (Signed)
[place] for IV heparin Indication: Lung mass, CT neg for PE  No Known Allergies  Patient Measurements: Height: 5\' 9"  (175.3 cm) Weight: 104 lb (47.2 kg) IBW/kg (Calculated) : 66.2 Heparin Dosing Weight: TBW  Vital Signs: Temp: 98.6 F (37 C) (10/29 2131) Temp Source: Oral (10/29 2131) BP: 176/95 (10/29 2131) Pulse Rate: 97 (10/29 2131)  Labs: Recent Labs    03/17/18 1700 03/17/18 2006 03/18/18 0256  HGB 9.0*  --  8.6*  HCT 28.0*  --  27.4*  PLT 306  --  310  APTT  --  28  --   LABPROT  --  14.7  --   INR  --  1.16  --   HEPARINUNFRC  --   --  0.21*  CREATININE 0.56  --   --    Estimated Creatinine Clearance: 54.3 mL/min (by C-G formula based on SCr of 0.56 mg/dL).  Medical History: Past Medical History:  Diagnosis Date  . Bronchitis   . Chronic back pain   . Scoliosis     Medications:  Medications Prior to Admission  Medication Sig Dispense Refill Last Dose  . ibuprofen (ADVIL,MOTRIN) 200 MG tablet Take 200-400 mg by mouth every 6 (six) hours as needed for headache or moderate pain.   03/15/2018  . Multiple Vitamin (MULTIVITAMIN WITH MINERALS) TABS tablet Take 1 tablet by mouth daily.   03/15/2018  . oxyCODONE-acetaminophen (PERCOCET/ROXICET) 5-325 MG tablet Take 1 tablet by mouth every 8 (eight) hours as needed for severe pain. (Patient not taking: Reported on 03/17/2018) 15 tablet 0 Not Taking at Unknown time   Scheduled:  . dextromethorphan-guaiFENesin  1 tablet Oral BID  . iopamidol      . multivitamin with minerals  1 tablet Oral Daily  . sodium chloride       Assessment: 52 yoF, current smoker, recent Dx of RUL mass involving SVC 3 months ago but did not follow up. Now with progressive SOB x 2 wks. CTA shows enlarged R lung mass with occlusion of RML pulmonary artery. Oncologist consulted, recommended starting IV heparin to r/o thrombus and IR biopsy tomorrow.    Baseline INR, aPTT wnl, CBC: hgb low but similar  to earlier this year; Plt wnl  Prior anticoagulation: none  Today, 03/18/2018:  Heparin 4000 units, infusion at 1000 units/hr  0400 Hep level = 0.21, subtherapeutic  Goal of Therapy: Heparin level 0.3-0.7 units/ml Monitor platelets by anticoagulation protocol: Yes  Plan:  Increase Heparin to 1300 units/hr  Check Hep level at 1100  Daily CBC, daily heparin level once stable  Monitor for signs of bleeding or thrombosis  F/u timing for Bx tomorrow; hold per MD instructions  Minda Ditto PharmD Pager [phone] 03/18/2018, 5:18 AM

## 2018-03-18 NOTE — Procedures (Addendum)
Interventional Radiology Procedure Note  Procedure: attempt of CT guided biopsy of right upper lobe mass. Findings:  The peripheral aspect of the lung mass was avoided (presumed atelectasis), and the central aspect of the mass was avoided (major vascular structures).  The low density aspect of the mass was targeted as the safest target.   The area targeted was found to be large cystic area, with frank serous fluid returned.  No tissue was obtained.  We withdrew from the attempt without tissue.   Complications: None  Recommendations: - Bedrest until CXR cleared.  Minimize talking, coughing or otherwise straining.  - Follow up 1 hr CXR pending  - NPO until CXR cleared.  Once the CXR is reviewed and cleared, the patient may be given a diet, per the primary teams discretion.  VIR will not order the diet.  - suggest PET CT to determine best target, or bronchoscopy as alternative next step given the likelihood of central/airway tumor.   Signed,  Corrie Mckusick, DO

## 2018-03-18 NOTE — Progress Notes (Signed)
[place] for IV heparin Indication: Lung mass, concern for PE  No Known Allergies  Patient Measurements: Height: 5\' 9"  (175.3 cm) Weight: 104 lb (47.2 kg) IBW/kg (Calculated) : 66.2 Heparin Dosing Weight = TBW = 47.2 kg  Vital Signs: Temp: 98.3 F (36.8 C) (10/30 0457) Temp Source: Oral (10/30 0457) BP: 159/82 (10/30 0647) Pulse Rate: 98 (10/30 0457)  Labs: Recent Labs    03/17/18 1700 03/17/18 2006 03/18/18 0256  HGB 9.0*  --  8.6*  HCT 28.0*  --  27.4*  PLT 306  --  310  APTT  --  28  --   LABPROT  --  14.7  --   INR  --  1.16  --   HEPARINUNFRC  --   --  0.21*  CREATININE 0.56  --   --    Estimated Creatinine Clearance: 54.3 mL/min (by C-G formula based on SCr of 0.56 mg/dL).  Medical History: Past Medical History:  Diagnosis Date  . Bronchitis   . Chronic back pain   . Scoliosis     Assessment: 85 yoF, current smoker, recent Dx of RUL mass involving SVC 3 months ago but did not follow up. Now with progressive SOB x 2 wks. CTA shows enlarged R lung mass with occlusion of RML pulmonary artery. Oncologist consulted, recommended starting IV heparin to r/o thrombus and IR biopsy tomorrow.   Baseline INR, aPTT wnl, CBC: hgb low but similar to earlier this year; Plt wnl  Prior anticoagulation: none  Today, 03/18/2018:  Hgb 8.6 low but stable  Plt 310 stable WNL  10/29 CT Angio Chest: negative for PE  HL = 0.21 was subtherapeutic on heparin infusion of 1000 units/hr and rate was increased to 1300 units/hr. Pt weight was updated overnight and is lower than initially recorded on admission, therefore will decrease heparin infusion rate.  Goal of Therapy: Heparin level 0.3-0.7 units/ml Monitor platelets by anticoagulation protocol: Yes  Plan:  Given updated weight, will decrease heparin infusion to 1100 units/hr  Check 6 hour HL  Daily CBC, daily heparin level once stable  Monitor for signs of bleeding or  thrombosis  Hold per MD instructions prior to biopsy  Lenis Noon, PharmD Clinical Pharmacist 03/18/18 7:23 AM

## 2018-03-18 NOTE — Progress Notes (Signed)
PROGRESS NOTE    Sonya [name]  HFW:[id number] DOB: May 26, 1955 DOA: 03/17/2018 PCP: Default, Provider, MD   Brief Narrative:  Sonya [name] is a 62 y.o. female with medical history significant of right lung mass presenting to the hospital for evaluation of shortness of breath. 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  Patient was offered admission for further work-up at that time but she declined it.  Plan was for her to follow-up with her primary care provider. Patient states she did not follow-up with a doctor since her ED discharge in July.  She continued to smoke cigarettes and quit a week ago; history of smoking 1 pack/day for 37 years.  States that she has been feeling short of breath with exertion and when lying down for the past 2 weeks.  Also having a cough productive of white-colored sputum.  Symptoms have been getting progressively worse.  Denies having any hemoptysis.  She has unintentionally lost 5 to 6 pounds since July.  Denies having any chest pain.  Denies having any hematemesis, melena, or hematochezia.  ED Course: Afebrile.  Tachycardic initially but now pulse normal.  Continues to be tachypneic.  Not hypotensive.  Satting well on room air.  No leukocytosis.  Corrected calcium 12.3.  I-STAT troponin negative.  EKG not suggestive of ACS.  Chest x-ray showing known right suprahilar mass with associated upper lobe atelectasis.  CTA chest showing no pulmonary embolus, however, increased vascular invasion and/or mass-effect by the right suprahilar mass, resulting in new occlusion of the right middle lobe pulmonary artery and persistent right upper lobe pulmonary artery occlusion as well as occlusion/invasion of the superior vena cava.  Debris within the right mainstem bronchus.  Right upper lobe atelectasis.  CT  abdomen pelvis showing no acute abnormality.  ED physician discussed the case with Dr. Jana Hakim from oncology who recommended starting the patient on IV heparin and consulting IR in the morning for biopsy. [facility] paged to admit.     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  Patient seen in the ED December 04, 2017 CT chest at that time revealed large right upper lobe/suprahilar mass involving right upper lobe bronchus, right upper lobe pulmonary artery and SVC.  Patient refused admission at that time and was subsequently lost to follow-up.  Patient has not seen a PCP in over 1 to 2 years.  Patient with ongoing tobacco abuse however states quit 1 week ago.  CT angiogram chest done showed increasing vascular invasion and no mass-effect of the right suprahilar mass, resulting in new occlusion of right middle lobe pulmonary artery and persistent right upper lobe pulmonary artery occlusion as well as occlusion/invasion of the SVC.  Debris noted in the right mainstem bronchus.  Right upper lobe atelectasis.  Case was discussed with pulmonary who looked at the CT findings and did not feel a bronchoscopy at this time will be of any benefit and increased risk, due to external compression from mass.  IR has been consulted to evaluate for possible biopsy.  IR concerned that may not get a good sample as may likely have some necrosis however they are willing to assess and try a CT guided biopsy this afternoon.  Continue Mucinex.  Discontinue IV heparin as CT angios chest negative for PE.  Patient lost to follow-up after initial findings on CT scan in July 2019 and concerned that patient will be lost to  follow-up if discharge prior to tissue biopsy and evaluation from oncology.  Will consult oncology formally for further evaluation and management.  2.  Chronic anemia Patient denies any overt bleeding.  No hemoptysis.  No hematemesis.  No melena.  No hematochezia.  Anemia panel with iron level of 14, TIBC decreased at 213.  Ferritin of 103.  Hemoglobin currently at 8.6.  Follow H&H.  Transfusion threshold hemoglobin less than 7.  Oncology evaluation pending.  3.  Hypertensive urgency Patient noted to have systolic blood pressures in the 190s and 180s early on this morning.  Is noted on review of the chart that patient has had a history of hypertension was supposed to be on antihypertensive medications.  Patient states has not been on antihypertensive medications in over a year and has not seen a PCP since then.  Start Norvasc and lisinopril and titrate as needed for better blood pressure control.  Hydralazine as needed.  4.  Hypercalcemia Corrected calcium was 12.3 on admission.  Concern for underlying malignancy.  Patient asymptomatic.  Check a magnesium level, PTH, ionized calcium, vitamin D level, phosphorus.  IV fluids.  Follow.    DVT prophylaxis: Lovenox Code Status: Full Family Communication: Updated patient.  No family at bedside. Disposition Plan: Pending work-up.  Likely home once medically stable.  Patient was seen in July and was lost to follow-up.   Consultants:   Interventional radiology  Oncology pending  Procedures:   CT angiogram chest 03/17/2018  CT abdomen and pelvis 03/18/2018  Chest x-ray 03/17/2018  Antimicrobials:   None   Subjective: In bed coughing.  Denies any chest pain.  Denies any shortness of breath at rest however noted to be short of breath on minimal exertion over the past 2 weeks prior to admission.  Patient denies any hemoptysis.  Objective: Vitals:   03/18/18 0750 03/18/18 0751 03/18/18 0805 03/18/18 0807  BP: (!) 182/93 (!) 182/93 (!)  166/88 (!) 170/90  Pulse: 89     Resp: 18     Temp: 98.2 F (36.8 C)     TempSrc: Oral     SpO2: 97%     Weight:      Height:        Intake/Output Summary (Last 24 hours) at 03/18/2018 1013 Last data filed at 03/18/2018 0750 Gross per 24 hour  Intake 2516.92 ml  Output -  Net 2516.92 ml   Filed Weights   03/17/18 1612 03/17/18 2131  Weight: 56.7 kg 47.2 kg    Examination:  General exam: Appears calm and comfortable  Respiratory system: Some scattered coarse breath sounds.  No wheezing.  Respiratory effort normal. Cardiovascular system: S1 & S2 heard, RRR. No JVD, murmurs, rubs, gallops or clicks. No pedal edema. Gastrointestinal system: Abdomen is nondistended, soft and nontender. No organomegaly or masses felt. Normal bowel sounds heard. Central nervous system: Alert and oriented. No focal neurological deficits. Extremities: Symmetric 5 x 5 power. Skin: No rashes, lesions or ulcers Psychiatry: Judgement and insight appear normal. Mood & affect appropriate.     Data Reviewed: I have personally reviewed following labs and imaging studies  CBC: Recent Labs  Lab 03/17/18 1700 03/18/18 0256  WBC 7.4 9.0  NEUTROABS 5.8  --   HGB 9.0* 8.6*  HCT 28.0* 27.4*  MCV 94.6 95.5  PLT 306 010   Basic Metabolic Panel: Recent Labs  Lab 03/17/18 1700 03/18/18 0826  NA 134* 132*  K 3.8 3.5  CL 100 101  CO2 26 24  GLUCOSE 107* 101*  BUN 9 8  CREATININE 0.56 0.64  CALCIUM 11.4* 10.7*  MG  --  1.7   GFR: Estimated Creatinine Clearance: 54.3 mL/min (by C-G formula based on SCr of 0.64 mg/dL). Liver Function Tests: Recent Labs  Lab 03/17/18 1700 03/18/18 0826  AST 12* 11*  ALT 8 8  ALKPHOS 69 64  BILITOT 0.6 0.5  PROT 7.8 7.7  ALBUMIN 2.9* 3.0*   No results for input(s): LIPASE, AMYLASE in the last 168 hours. No results for input(s): AMMONIA in the last 168 hours. Coagulation Profile: Recent Labs  Lab 03/17/18 2006  INR 1.16   Cardiac Enzymes: No  results for input(s): CKTOTAL, CKMB, CKMBINDEX, TROPONINI in the last 168 hours. BNP (last 3 results) No results for input(s): PROBNP in the last 8760 hours. HbA1C: No results for input(s): HGBA1C in the last 72 hours. CBG: No results for input(s): GLUCAP in the last 168 hours. Lipid Profile: No results for input(s): CHOL, HDL, LDLCALC, TRIG, CHOLHDL, LDLDIRECT in the last 72 hours. Thyroid Function Tests: No results for input(s): TSH, T4TOTAL, FREET4, T3FREE, THYROIDAB in the last 72 hours. Anemia Panel: Recent Labs    03/18/18 0256  FERRITIN 103  TIBC 213*  IRON 14*   Sepsis Labs: No results for input(s): PROCALCITON, LATICACIDVEN in the last 168 hours.  No results found for this or any previous visit (from the past 240 hour(s)).       Radiology Studies: Dg Chest 2 View  Result Date: 03/17/2018 CLINICAL DATA:  Shortness of breath EXAM: CHEST - 2 VIEW COMPARISON:  Chest CT 12/04/2017 FINDINGS: Consolidation of the right upper lobe consistent with known upper lobe mass and likely postobstructive atelectasis. No pleural effusion. Heart size normal. The left lung is clear. IMPRESSION: Known right suprahilar mass with associated upper lobe atelectasis. Lungs are otherwise clear. Electronically Signed   By: Ulyses Jarred M.D.   On: 03/17/2018 18:01   Ct Angio Chest Pe W And/or Wo Contrast  Result Date: 03/17/2018 CLINICAL DATA:  Shortness of breath and back pain. Known right lung mass. EXAM: CT ANGIOGRAPHY CHEST CT ABDOMEN AND PELVIS WITH CONTRAST TECHNIQUE: Multidetector CT imaging of the chest was performed using the standard protocol during bolus administration of intravenous contrast. Multiplanar CT image reconstructions and MIPs were obtained to evaluate the vascular anatomy. Multidetector CT imaging of the abdomen and pelvis was performed using the standard protocol during bolus administration of intravenous contrast. CONTRAST:  140mL ISOVUE-370 IOPAMIDOL (ISOVUE-370) INJECTION  76% COMPARISON:  Chest CT 12/04/2017 FINDINGS: CTA CHEST FINDINGS Cardiovascular: Contrast injection is sufficient to demonstrate satisfactory opacification of the pulmonary arteries to the segmental level. There is no pulmonary embolus. The main pulmonary artery is within normal limits for size. 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 The inter lobar artery is now also occluded. There is calcific aortic atherosclerosis. No dissection. There is a normal variant aortic arch branching pattern with the brachiocephalic and left common carotid arteries sharing a common origin. Heart size is normal, without pericardial effusion. There are large  paraspinal venous collaterals. Mediastinum/Nodes: Right-sided mediastinal fat is effaced by the low right suprahilar mass. Multiple subcentimeter bilateral axillary lymph nodes. Lungs/Pleura: There is debris within the right mainstem bronchus and bronchus intermedius. Right upper lobe bronchus is occluded. There is a large suprahilar right lung mass that appears unchanged in size. The mass is difficult to separate from the surrounding lung. 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 Increased confluent nodularity in the left lower lobe. Clustered nodules in the left upper lobe have increased in size, measuring up to 12 mm. Musculoskeletal: No chest wall abnormality. No acute or significant osseous findings. Review of the MIP images confirms the above findings. CT ABDOMEN and PELVIS FINDINGS Hepatobiliary: Normal hepatic contours and density. No visible biliary dilatation. Normal gallbladder. Pancreas: Normal contours without ductal dilatation. No peripancreatic fluid collection. Spleen: Normal. Adrenals/Urinary Tract: Normal adrenal glands. 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 Stomach/Bowel: Matted bowel in the lower abdomen and pelvis. No dilatation or  inflammation visualized. Vascular/Lymphatic: Atherosclerotic calcification is present within the non-aneurysmal abdominal aorta, without hemodynamically significant stenosis. Major abdominal veins are patent. No abdominal or pelvic lymphadenopathy. Reproductive: There are multiple enhancing nodules within the posterior left pelvis. Difficult to accurately localize these lesions due to crowding pelvic structures, but these may be fibroids within the uterine wall. Musculoskeletal. No bony spinal canal stenosis or focal osseous abnormality. Other: None. IMPRESSION: 1. 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 2. Debris within the right mainstem bronchus. Right upper lobe atelectasis. 3. No acute abnormality of the abdomen or pelvis. Matted appearance of the small bowel and colon without dilatation or inflammation. 4. Enhancing nodular foci along the posterior left pelvis. Exact location is not entirely clear, but these are suspected to be subserosal fibroids of the left uterine fundus. Aortic Atherosclerosis (ICD10-I70.0). Electronically Signed   By: Ulyses Jarred M.D.   On: 03/17/2018 19:14   Ct Abdomen Pelvis W Contrast  Result Date: 03/17/2018 CLINICAL DATA:  Shortness of breath and back pain. Known right lung mass. EXAM: CT ANGIOGRAPHY CHEST CT ABDOMEN AND PELVIS WITH CONTRAST TECHNIQUE: Multidetector CT imaging of the chest was performed using the standard protocol during bolus administration of intravenous contrast. Multiplanar CT image reconstructions and MIPs were obtained to evaluate the vascular anatomy. Multidetector CT imaging of the abdomen and pelvis was performed using the standard protocol during bolus administration of intravenous contrast. CONTRAST:   128mL ISOVUE-370 IOPAMIDOL (ISOVUE-370) INJECTION 76% COMPARISON:  Chest CT 12/04/2017 FINDINGS: CTA CHEST FINDINGS Cardiovascular: Contrast injection is sufficient to demonstrate satisfactory opacification of the pulmonary arteries to the segmental level. There is no pulmonary embolus. The main pulmonary artery is within normal limits for size. 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 The inter lobar artery is now also occluded. There is calcific aortic atherosclerosis. No dissection. There is a normal variant aortic arch branching pattern with the brachiocephalic and left common carotid arteries sharing a common origin. Heart size is normal, without pericardial effusion. There are large paraspinal venous collaterals. Mediastinum/Nodes: Right-sided mediastinal fat is effaced by the low right suprahilar mass. Multiple subcentimeter bilateral axillary lymph nodes. Lungs/Pleura: There is debris within the right mainstem bronchus and bronchus intermedius. Right upper lobe bronchus is occluded.  There is a large suprahilar right lung mass that appears unchanged in size. The mass is difficult to separate from the surrounding lung. 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 Increased confluent nodularity in the left lower lobe. Clustered nodules in the left upper lobe have increased in size, measuring up to 12 mm. Musculoskeletal: No chest wall abnormality. No acute or significant osseous findings. Review of the MIP images confirms the above findings. CT ABDOMEN and PELVIS FINDINGS Hepatobiliary: Normal hepatic contours and density. No visible biliary dilatation. Normal gallbladder. Pancreas: Normal contours without ductal dilatation. No peripancreatic fluid collection. Spleen: Normal. Adrenals/Urinary Tract: Normal adrenal glands. 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 Stomach/Bowel: Matted bowel in the  lower abdomen and pelvis. No dilatation or inflammation visualized. Vascular/Lymphatic: Atherosclerotic calcification is present within the non-aneurysmal abdominal aorta, without hemodynamically significant stenosis. Major abdominal veins are patent. No abdominal or pelvic lymphadenopathy. Reproductive: There are multiple enhancing nodules within the posterior left pelvis. Difficult to accurately localize these lesions due to crowding pelvic structures, but these may be fibroids within the uterine wall. Musculoskeletal. No bony spinal canal stenosis or focal osseous abnormality. Other: None. IMPRESSION: 1. 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 2. Debris within the right mainstem bronchus. Right upper lobe atelectasis. 3. No acute abnormality of the abdomen or pelvis. Matted appearance of the small bowel and colon without dilatation or inflammation. 4. Enhancing nodular foci along the posterior left pelvis. Exact location is not entirely clear, but these are suspected to be subserosal fibroids of the left uterine fundus. Aortic Atherosclerosis (ICD10-I70.0). Electronically Signed   By: Ulyses Jarred M.D.   On: 03/17/2018 19:14        Scheduled Meds: . amLODipine  10 mg Oral Daily  . dextromethorphan-guaiFENesin  1 tablet Oral BID  . enoxaparin (LOVENOX) injection  40 mg Subcutaneous Q24H  . enoxaparin   Does not apply Once  . lisinopril  20 mg Oral Daily  . multivitamin with minerals  1 tablet Oral Daily  . potassium chloride  40 mEq Oral Once   Continuous Infusions: . sodium chloride 125 mL/hr at 03/18/18 0750  . magnesium sulfate 1 - 4 g bolus IVPB       LOS: 0 days    Time spent: 40 minutes    Irine Seal, MD [facility] Pager [phone]  If 7PM-7AM, please contact night-coverage [email or web address] Password [email or web address] 03/18/2018, 10:13 AM

## 2018-03-19 ENCOUNTER — Telehealth: Payer: Self-pay

## 2018-03-19 DIAGNOSIS — J441 Chronic obstructive pulmonary disease with (acute) exacerbation: Secondary | ICD-10-CM

## 2018-03-19 LAB — CBC
HCT: 26.9 % — ABNORMAL LOW (ref 36.0–46.0)
HEMOGLOBIN: 8.5 g/dL — AB (ref 12.0–15.0)
MCH: 30.1 pg (ref 26.0–34.0)
MCHC: 31.6 g/dL (ref 30.0–36.0)
MCV: 95.4 fL (ref 80.0–100.0)
Platelets: 306 10*3/uL (ref 150–400)
RBC: 2.82 MIL/uL — AB (ref 3.87–5.11)
RDW: 13.5 % (ref 11.5–15.5)
WBC: 12.3 10*3/uL — ABNORMAL HIGH (ref 4.0–10.5)
nRBC: 0 % (ref 0.0–0.2)

## 2018-03-19 LAB — COMPREHENSIVE METABOLIC PANEL
ALBUMIN: 3 g/dL — AB (ref 3.5–5.0)
ALT: 8 U/L (ref 0–44)
AST: 12 U/L — AB (ref 15–41)
Alkaline Phosphatase: 61 U/L (ref 38–126)
Anion gap: 8 (ref 5–15)
BILIRUBIN TOTAL: 0.8 mg/dL (ref 0.3–1.2)
BUN: 7 mg/dL — AB (ref 8–23)
CO2: 25 mmol/L (ref 22–32)
CREATININE: 0.61 mg/dL (ref 0.44–1.00)
Calcium: 10.9 mg/dL — ABNORMAL HIGH (ref 8.9–10.3)
Chloride: 101 mmol/L (ref 98–111)
GFR calc Af Amer: >60 mL/min (ref >60)
GLUCOSE: 105 mg/dL — AB (ref 70–99)
Potassium: 4.3 mmol/L (ref 3.5–5.1)
Sodium: 134 mmol/L — ABNORMAL LOW (ref 135–145)
TOTAL PROTEIN: 7.5 g/dL (ref 6.5–8.1)

## 2018-03-19 LAB — PTH, INTACT AND CALCIUM
Calcium, Total (PTH): 10.9 mg/dL — ABNORMAL HIGH (ref 8.7–10.3)
PTH: 8 pg/mL — ABNORMAL LOW (ref 15–65)

## 2018-03-19 LAB — VITAMIN D 25 HYDROXY (VIT D DEFICIENCY, FRACTURES): Vit D, 25-Hydroxy: 9.8 ng/mL — ABNORMAL LOW (ref 30.0–100.0)

## 2018-03-19 LAB — MAGNESIUM: Magnesium: 2.1 mg/dL (ref 1.7–2.4)

## 2018-03-19 LAB — CALCIUM, IONIZED: Calcium, Ionized, Serum: 6.1 mg/dL — ABNORMAL HIGH (ref 4.5–5.6)

## 2018-03-19 MED ORDER — POTASSIUM PHOSPHATES 15 MMOLE/5ML IV SOLN
30.0000 mmol | Freq: Once | INTRAVENOUS | Status: AC
  Administered 2018-03-19: 30 mmol via INTRAVENOUS
  Filled 2018-03-19: qty 10

## 2018-03-19 MED ORDER — IPRATROPIUM-ALBUTEROL 0.5-2.5 (3) MG/3ML IN SOLN: 3.0000 mL | RESPIRATORY_TRACT | Status: DC | PRN

## 2018-03-19 MED ORDER — METHYLPREDNISOLONE SODIUM SUCC 125 MG IJ SOLR
60.0000 mg | Freq: Two times a day (BID) | INTRAMUSCULAR | Status: DC
  Administered 2018-03-19 – 2018-03-20 (×4): 60 mg via INTRAVENOUS
  Filled 2018-03-19 (×4): qty 2

## 2018-03-19 MED ORDER — HYDROCOD POLST-CPM POLST ER 10-8 MG/5ML PO SUER
5.0000 mL | Freq: Four times a day (QID) | ORAL | Status: DC | PRN
  Administered 2018-03-19 – 2018-03-22 (×4): 5 mL via ORAL
  Filled 2018-03-19 (×5): qty 5

## 2018-03-19 MED ORDER — IPRATROPIUM-ALBUTEROL 0.5-2.5 (3) MG/3ML IN SOLN
3.0000 mL | Freq: Four times a day (QID) | RESPIRATORY_TRACT | Status: DC
  Administered 2018-03-19: 3 mL via RESPIRATORY_TRACT
  Filled 2018-03-19: qty 3

## 2018-03-19 MED ORDER — BENZONATATE 100 MG PO CAPS: 200.0000 mg | ORAL_CAPSULE | Freq: Three times a day (TID) | ORAL | Status: DC | PRN

## 2018-03-19 MED ORDER — MOMETASONE FURO-FORMOTEROL FUM 100-5 MCG/ACT IN AERO
2.0000 | INHALATION_SPRAY | Freq: Two times a day (BID) | RESPIRATORY_TRACT | Status: DC
  Filled 2018-03-19: qty 8.8

## 2018-03-19 MED ORDER — ENSURE ENLIVE PO LIQD
237.0000 mL | Freq: Two times a day (BID) | ORAL | Status: DC
  Administered 2018-03-19 – 2018-03-27 (×11): 237 mL via ORAL

## 2018-03-19 MED ORDER — LEVOFLOXACIN 500 MG PO TABS
500.0000 mg | ORAL_TABLET | Freq: Every day | ORAL | Status: DC
  Administered 2018-03-19 – 2018-03-24 (×6): 500 mg via ORAL
  Filled 2018-03-19 (×6): qty 1

## 2018-03-19 MED ORDER — IPRATROPIUM-ALBUTEROL 0.5-2.5 (3) MG/3ML IN SOLN
3.0000 mL | Freq: Three times a day (TID) | RESPIRATORY_TRACT | Status: DC
  Administered 2018-03-19 – 2018-03-20 (×2): 3 mL via RESPIRATORY_TRACT
  Filled 2018-03-19 (×2): qty 3

## 2018-03-19 MED ORDER — BUDESONIDE 0.5 MG/2ML IN SUSP
0.5000 mg | Freq: Two times a day (BID) | RESPIRATORY_TRACT | Status: DC
  Administered 2018-03-19 – 2018-03-28 (×19): 0.5 mg via RESPIRATORY_TRACT
  Filled 2018-03-19 (×19): qty 2

## 2018-03-19 NOTE — Care Management Note (Signed)
Case Management Note  Patient Details  Name: Sonya [name] MRN: [id number] Date of Birth: [birth year]  Subjective/Objective: pcp-Kimberly Harris-pcp appt set-in f/u section of d/c.PT-[facility]. [facility] chosen for [facility] aware of d/c.Has pharmacy-[facility].                    Action/Plan:d/c home w/HHC.   Expected Discharge Date:  (unknown)               Expected Discharge Plan:  [place]  In-House Referral:     Discharge planning Services  CM Consult  Post Acute Care Choice:    Choice offered to:  Patient  DME Arranged:    DME Agency:     HH Arranged:  PT, Social Work [employer] Agency:  [place]  Status of Service:  Completed, signed off  If discussed at [employer] [facility] [employer], dates discussed:    Additional Comments:  Dessa Phi, RN 03/19/2018, 1:45 PM

## 2018-03-19 NOTE — Evaluation (Signed)
Occupational Therapy Evaluation Patient Details Name: Sonya [name] MRN: [id number] DOB: 12-28-1955 Today's Date: 03/19/2018    History of Present Illness  62 y.o. female with medical history significant of right lung mass presenting to the hospital for evaluation of shortness of breath. 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   Clinical Impression   Pt was admitted for the above.  At baseline, she is independent. Pt demonstrated decreased activity tolerance and was coughing with increased WOB during evaluation. Will follow with the goals below, focusing on energy conservation and tub DME.    Follow Up Recommendations  Supervision/Assistance - 24 hour    Equipment Recommendations  Tub/shower seat vs tub bench   Recommendations for Other Services       Precautions / Restrictions Precautions Precautions: Fall Restrictions Weight Bearing Restrictions: No      Mobility Bed Mobility Overal bed mobility: Independent                Transfers                 General transfer comment: not attempted. Pt with coughing/SOB     Balance                                           ADL either performed or assessed with clinical judgement   ADL Overall ADL's : Needs assistance/impaired Eating/Feeding: Independent   Grooming: Set up   Upper Body Bathing: Set up   Lower Body Bathing: Minimal assistance;Sitting/lateral leans   Upper Body Dressing : Set up   Lower Body Dressing: Minimal assistance;Sitting/lateral leans                 General ADL Comments: pt limited by coughing. Did not want to get OOB. Pt is able to cross legs for adls:  doesn't need AE. Provided handouts for energy conservation, and she verbalizes understanding     Vision         Perception      Praxis      Pertinent Vitals/Pain Pain Assessment: No/denies pain     Hand Dominance     Extremity/Trunk Assessment Upper Extremity Assessment Upper Extremity Assessment: Overall WFL for tasks assessed           Communication     Cognition Arousal/Alertness: Awake/alert Behavior During Therapy: WFL for tasks assessed/performed Overall Cognitive Status: Within Functional Limits for tasks assessed                                     General Comments       Exercises     Shoulder Instructions      Home Living Family/patient expects to be discharged to:: Private residence Living Arrangements: (niece) Available Help at Discharge: Family;Available 24 hours/day Type of Home: House             Bathroom Shower/Tub: Teacher, early years/pre: Standard     Home Equipment: None          Prior Functioning/Environment          Comments: niece does a lot of cooking and meal prep  OT Problem List: Decreased activity tolerance;Cardiopulmonary status limiting activity;Decreased knowledge of use of DME or AE      OT Treatment/Interventions: Self-care/ADL training;DME and/or AE instruction;Therapeutic activities;Energy conservation    OT Goals(Current goals can be found in the care plan section) Acute Rehab OT Goals Patient Stated Goal: return to independence OT Goal Formulation: With patient Time For Goal Achievement: 04/02/18 Potential to Achieve Goals: Good ADL Goals Pt Will Perform Tub/Shower Transfer: Tub transfer;with min guard assist;shower seat;tub bench(vs) Additional ADL Goal #1: pt will verbalize 3 energy conservation strategies and initiate at least one rest break during adls for energy conservation  OT Frequency: Min 2X/week   Barriers to D/C:            Co-evaluation              AM-PAC PT "6 Clicks" Daily Activity     Outcome Measure Help from another person eating meals?: None Help from another  person taking care of personal grooming?: A Little Help from another person toileting, which includes using toliet, bedpan, or urinal?: A Little Help from another person bathing (including washing, rinsing, drying)?: A Little Help from another person to put on and taking off regular upper body clothing?: A Little Help from another person to put on and taking off regular lower body clothing?: A Little 6 Click Score: 19   End of Session    Activity Tolerance: (coughing/SOB) Patient left: in bed;with call bell/phone within reach;with family/visitor present  OT Visit Diagnosis: Muscle weakness (generalized) (M62.81)                Time: 0347-4259 OT Time Calculation (min): 12 min Charges:  OT General Charges $OT Visit: 1 Visit OT Evaluation $OT Eval Low Complexity: 1 Low  Lesle Chris, OTR/L Acute Rehabilitation Services [phone] [facility] pager [phone] office 03/19/2018  [place] 03/19/2018, 1:37 PM

## 2018-03-19 NOTE — Progress Notes (Signed)
PROGRESS NOTE    Sonya [name]  RKY:[id number] DOB: 1956-04-24 DOA: 03/17/2018 PCP: Default, Provider, MD   Brief Narrative:  Sonya [name] is a 62 y.o. female with medical history significant of right lung mass presenting to the hospital for evaluation of shortness of breath. 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  Patient was offered admission for further work-up at that time but she declined it.  Plan was for her to follow-up with her primary care provider. Patient states she did not follow-up with a doctor since her ED discharge in July.  She continued to smoke cigarettes and quit a week ago; history of smoking 1 pack/day for 37 years.  States that she has been feeling short of breath with exertion and when lying down for the past 2 weeks.  Also having a cough productive of white-colored sputum.  Symptoms have been getting progressively worse.  Denies having any hemoptysis.  She has unintentionally lost 5 to 6 pounds since July.  Denies having any chest pain.  Denies having any hematemesis, melena, or hematochezia.  ED Course: Afebrile.  Tachycardic initially but now pulse normal.  Continues to be tachypneic.  Not hypotensive.  Satting well on room air.  No leukocytosis.  Corrected calcium 12.3.  I-STAT troponin negative.  EKG not suggestive of ACS.  Chest x-ray showing known right suprahilar mass with associated upper lobe atelectasis.  CTA chest showing no pulmonary embolus, however, increased vascular invasion and/or mass-effect by the right suprahilar mass, resulting in new occlusion of the right middle lobe pulmonary artery and persistent right upper lobe pulmonary artery occlusion as well as occlusion/invasion of the superior vena cava.  Debris within the right mainstem bronchus.  Right upper lobe atelectasis.  CT  abdomen pelvis showing no acute abnormality.  ED physician discussed the case with Dr. Jana Hakim from oncology who recommended starting the patient on IV heparin and consulting IR in the morning for biopsy. TRH paged to admit.     Assessment & Plan:   Principal Problem:   Pulmonary mass Active Problems:   Chronic anemia   Hypercalcemia   Hypertensive urgency   HTN (hypertension)   Lung mass   Occlusion of pulmonary artery (HCC)  #1 pulmonary mass occluding/invading right pulmonary artery and SVC/lung mass with SVC Patient presented to the ED with 2-week history of worsening dyspnea and cough in addition to on intentional weight loss.  Patient seen in the ED December 04, 2017 CT chest at that time revealed large right upper lobe/suprahilar mass involving right upper lobe bronchus, right upper lobe pulmonary artery and SVC.  Patient refused admission at that time and was subsequently lost to follow-up.  Patient has not seen a PCP in over 1 to 2 years.  Patient with ongoing tobacco abuse however states quit 1 week ago.  CT angiogram chest done showed increasing vascular invasion and no mass-effect of the right suprahilar mass, resulting in new occlusion of right middle lobe pulmonary artery and persistent right upper lobe pulmonary artery occlusion as well as occlusion/invasion of the SVC.  Debris noted in the right mainstem bronchus.  Right upper lobe atelectasis.  Case was discussed with pulmonary who looked at the CT findings and did not feel a bronchoscopy at this time will be of any benefit and increased risk, due to external compression from mass.  IR has been consulted to evaluate for possible biopsy.  IR  Was concerned that may not get a good sample as may likely have some necrosis however CT-guided biopsy was done on 03/18/2018, which showed a large cystic area with frank serous fluid returned and no tissue obtained.  IR recommended PET/CT to determine best target versus bronchoscopy as an  alternative next step.  Discussed again with pulmonary who feel concerned that bronchoscopy may lead to incidental laceration of pulmonary artery and feel currently risks right now bronchoscopy are high and recommending alternative of PET/CT as recommended per interventional radiology.  Oncology consultation pending.  Continue Mucinex.  Placed on IV Solu-Medrol, scheduled nebs, Pulmicort as patient with chest tightness, some expiratory wheezing and poor air movement.  2.  Probable COPD exacerbation Patient with poor air movement.  Shortness of breath on minimal exertion, expiratory wheezing noted on examination.  Continue Mucinex.  Placed on Tussionex as needed.  Discontinue Tessalon Perles.  Place on Pulmicort, scheduled duo nebs, Levaquin.  Will likely need outpatient follow-up with pulmonary.   3.  Chronic anemia Patient denies any overt bleeding.  No hemoptysis.  No hematemesis.  No melena.  No hematochezia.  Anemia panel with iron level of 14, TIBC decreased at 213.  Ferritin of 103.  Hemoglobin currently at 8.5.  Follow H&H.  Transfusion threshold hemoglobin less than 7.  Oncology evaluation pending.  4.  Hypertensive urgency Patient noted to have systolic blood pressures in the 190s and 180s early on this morning.  Is noted on review of the chart that patient has had a history of hypertension was supposed to be on antihypertensive medications.  Patient states has not been on antihypertensive medications in over a year and has not seen a PCP since then.  Blood pressure improved on current regimen of Norvasc and lisinopril.  Hydralazine as needed.   5.  Hypercalcemia Corrected calcium was 12.3 on admission.  Concern for underlying malignancy.  Patient asymptomatic.  Phosphorus level 2.2.  Magnesium was 1.7.  Ionized calcium 6.1.  Vitamin D 25 hydroxy was 9.8.  PTH was low at 8.  Continue hydration with IV fluids.  May need IV pamidronate however will defer for now pending oncology evaluation.   Follow.    DVT prophylaxis: Lovenox Code Status: Full Family Communication: Updated patient.  No family at bedside. Disposition Plan: Pending work-up.  Likely home once medically stable.  Patient was seen in July and was lost to follow-up.   Consultants:   Interventional radiology  Oncology pending  Procedures:   CT angiogram chest 03/17/2018  CT abdomen and pelvis 03/18/2018  Chest x-ray 03/17/2018  CT-guided biopsy right upper lobe mass--- large cystic area with frank serous fluid returned, no tissue obtained.  Per Dr. Earleen Newport IR 03/18/2018  Antimicrobials:   None   Subjective: Sitting up at the side of the bed.  Per RN patient asking for some Tussionex as it helped her with her cough and sleep last night.  Patient states ambulated in the hallway however was short of breath and had to stop to sit.  No chest pain.  No hemoptysis.   Objective: Vitals:   03/18/18 2339 03/19/18 0014 03/19/18 0200 03/19/18 0509  BP:  (!) 175/92 (!) 147/80 140/76  Pulse:  93 96 93  Resp:   18 18  Temp: 99.2 F (37.3 C) 97.6 F (36.4 C) 98.2 F (36.8 C) 98.3 F (36.8 C)  TempSrc: Oral Oral Oral Oral  SpO2:   94% 93%  Weight:      Height:        Intake/Output Summary (Last 24 hours) at 03/19/2018 0954 Last data filed at 03/19/2018 0700 Gross per 24 hour  Intake 2141.82 ml  Output 600 ml  Net 1541.82 ml   Filed Weights   03/17/18 1612 03/17/18 2131  Weight: 56.7 kg 47.2 kg    Examination:  General exam: NAD. Respiratory system: Some scattered coarse breath sounds.  Some expiratory wheezing.  Poor air movement.  Tight.   Cardiovascular system: Regular rate and rhythm no murmurs rubs or gallops.  No JVD.  No lower extremity edema.  Gastrointestinal system: Abdomen is soft, nontender, nondistended, positive bowel sounds.  No rebound.  No guarding.  Central nervous system: Alert and oriented. No focal neurological deficits. Extremities: Symmetric 5 x 5 power. Skin: No  rashes, lesions or ulcers Psychiatry: Judgement and insight appear normal. Mood & affect appropriate.     Data Reviewed: I have personally reviewed following labs and imaging studies  CBC: Recent Labs  Lab 03/17/18 1700 03/18/18 0256 03/19/18 0533  WBC 7.4 9.0 12.3*  NEUTROABS 5.8  --   --   HGB 9.0* 8.6* 8.5*  HCT 28.0* 27.4* 26.9*  MCV 94.6 95.5 95.4  PLT 306 310 562   Basic Metabolic Panel: Recent Labs  Lab 03/17/18 1700 03/18/18 0826 03/18/18 1734 03/19/18 0533  NA 134* 132*  --  134*  K 3.8 3.5  --  4.3  CL 100 101  --  101  CO2 26 24  --  25  GLUCOSE 107* 101*  --  105*  BUN 9 8  --  7*  CREATININE 0.56 0.64  --  0.61  CALCIUM 11.4* 10.7*  10.9*  --  10.9*  MG  --  1.7  --  2.1  PHOS  --   --  2.2*  --    GFR: Estimated Creatinine Clearance: 54.3 mL/min (by C-G formula based on SCr of 0.61 mg/dL). Liver Function Tests: Recent Labs  Lab 03/17/18 1700 03/18/18 0826 03/19/18 0533  AST 12* 11* 12*  ALT 8 8 8   ALKPHOS 69 64 61  BILITOT 0.6 0.5 0.8  PROT 7.8 7.7 7.5  ALBUMIN 2.9* 3.0* 3.0*   No results for input(s): LIPASE, AMYLASE in the last 168 hours. No results for input(s): AMMONIA in the last 168 hours. Coagulation Profile: Recent Labs  Lab 03/17/18 2006  INR 1.16   Cardiac Enzymes: No results for input(s): CKTOTAL, CKMB, CKMBINDEX, TROPONINI in the last 168 hours. BNP (last 3 results) No results for input(s): PROBNP in the last 8760 hours. HbA1C: No results for input(s): HGBA1C in the last 72 hours. CBG: No results for input(s): GLUCAP in the last 168 hours. Lipid Profile: No results for input(s): CHOL, HDL, LDLCALC, TRIG, CHOLHDL, LDLDIRECT in the last 72 hours. Thyroid Function Tests: No results for input(s): TSH, T4TOTAL, FREET4, T3FREE, THYROIDAB in the last 72 hours. Anemia Panel: Recent Labs    03/18/18 0256  FERRITIN 103  TIBC 213*  IRON 14*   Sepsis Labs: No results for input(s): PROCALCITON, LATICACIDVEN in the last  168 hours.  No results found for this or any previous visit (from the past 240 hour(s)).       Radiology Studies: Dg Chest 2 View  Result Date: 03/17/2018 CLINICAL DATA:  Shortness of breath EXAM: CHEST - 2 VIEW COMPARISON:  Chest CT 12/04/2017 FINDINGS: Consolidation of the right upper lobe consistent with known upper lobe mass and likely  postobstructive atelectasis. No pleural effusion. Heart size normal. The left lung is clear. IMPRESSION: Known right suprahilar mass with associated upper lobe atelectasis. Lungs are otherwise clear. Electronically Signed   By: Ulyses Jarred M.D.   On: 03/17/2018 18:01   Ct Angio Chest Pe W And/or Wo Contrast  Result Date: 03/17/2018 CLINICAL DATA:  Shortness of breath and back pain. Known right lung mass. EXAM: CT ANGIOGRAPHY CHEST CT ABDOMEN AND PELVIS WITH CONTRAST TECHNIQUE: Multidetector CT imaging of the chest was performed using the standard protocol during bolus administration of intravenous contrast. Multiplanar CT image reconstructions and MIPs were obtained to evaluate the vascular anatomy. Multidetector CT imaging of the abdomen and pelvis was performed using the standard protocol during bolus administration of intravenous contrast. CONTRAST:  130mL ISOVUE-370 IOPAMIDOL (ISOVUE-370) INJECTION 76% COMPARISON:  Chest CT 12/04/2017 FINDINGS: CTA CHEST FINDINGS Cardiovascular: Contrast injection is sufficient to demonstrate satisfactory opacification of the pulmonary arteries to the segmental level. There is no pulmonary embolus. The main pulmonary artery is within normal limits for size. 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 The inter lobar artery is now also occluded. There is calcific aortic atherosclerosis. No dissection. There is a normal variant aortic arch branching pattern with the brachiocephalic and left common carotid arteries sharing a common origin. Heart size is  normal, without pericardial effusion. There are large paraspinal venous collaterals. Mediastinum/Nodes: Right-sided mediastinal fat is effaced by the low right suprahilar mass. Multiple subcentimeter bilateral axillary lymph nodes. Lungs/Pleura: There is debris within the right mainstem bronchus and bronchus intermedius. Right upper lobe bronchus is occluded. There is a large suprahilar right lung mass that appears unchanged in size. The mass is difficult to separate from the surrounding lung. 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 Increased confluent nodularity in the left lower lobe. Clustered nodules in the left upper lobe have increased in size, measuring up to 12 mm. Musculoskeletal: No chest wall abnormality. No acute or significant osseous findings. Review of the MIP images confirms the above findings. CT ABDOMEN and PELVIS FINDINGS Hepatobiliary: Normal hepatic contours and density. No visible biliary dilatation. Normal gallbladder. Pancreas: Normal contours without ductal dilatation. No peripancreatic fluid collection. Spleen: Normal. Adrenals/Urinary Tract: Normal adrenal glands. 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 Stomach/Bowel: Matted bowel in the lower abdomen and pelvis. No dilatation or inflammation visualized. Vascular/Lymphatic: Atherosclerotic calcification is present within the non-aneurysmal abdominal aorta, without hemodynamically significant stenosis. Major abdominal veins are patent. No abdominal or pelvic lymphadenopathy. Reproductive: There are multiple enhancing nodules within the posterior left pelvis. Difficult to accurately localize these lesions due to crowding pelvic structures, but these may be fibroids within the uterine wall. Musculoskeletal. No bony spinal canal stenosis or focal osseous abnormality. Other: None. IMPRESSION: 1. No pulmonary embolus; however, increased vascular invasion and/or mass effect by the right suprahilar mass,  resulting in new occlusion of the right middle lobe pulmonary artery and persistent right upper lobe pulmonary artery occlusion as well as occlusion/invasion of the superior vena cava. 2. Debris within the right mainstem bronchus. Right upper lobe atelectasis. 3. No acute abnormality of the abdomen or pelvis. Matted appearance of the small bowel and colon without dilatation or inflammation. 4. Enhancing nodular foci along the posterior left pelvis. Exact location is not entirely clear, but these are suspected to be subserosal fibroids of the left uterine fundus. Aortic Atherosclerosis (ICD10-I70.0). Electronically Signed   By: Cletus Gash.D.  On: 03/17/2018 19:14   Mr Brain Wo Contrast  Result Date: 03/18/2018 CLINICAL DATA:  Status post lung mass biopsy, lung cancer. EXAM: MRI HEAD WITHOUT CONTRAST TECHNIQUE: Multiplanar, multiecho pulse sequences of the brain and surrounding structures were obtained without intravenous contrast. Due to cough and motion, patient was unable to tolerate further imaging and postcontrast images not obtained. COMPARISON:  CT HEAD June 13, 2015 FINDINGS: Variable motion degraded examination, severely motion degraded axial MPGR and axial T1. INTRACRANIAL CONTENTS: No reduced diffusion to suggest acute ischemia or hypercellular tumor. No susceptibility artifact to suggest hemorrhage though sequence is severely motion degraded. The ventricles and sulci are normal for patient's age. Patchy supratentorial and pontine white matter FLAIR T2 hyperintensities. No suspicious parenchymal signal, masses, mass effect. No abnormal extra-axial fluid collections. No extra-axial masses. VASCULAR: Normal major intracranial vascular flow voids present at skull base. SKULL AND UPPER CERVICAL SPINE: No abnormal sellar expansion. No suspicious calvarial bone marrow signal. Craniocervical junction maintained. SINUSES/ORBITS: Mild sphenoid sinus mucosal thickening. Mastoid air cells are well  aerated. Included ocular globes and orbital contents are non-suspicious. OTHER: None. IMPRESSION: 1. Motion degraded examination without acute intracranial process. No identified intracranial metastasis though postcontrast sequences would be more sensitive. 2. Mild-to-moderate chronic small vessel ischemic changes. Electronically Signed   By: Elon Alas M.D.   On: 03/18/2018 20:55   Ct Abdomen Pelvis W Contrast  Result Date: 03/17/2018 CLINICAL DATA:  Shortness of breath and back pain. Known right lung mass. EXAM: CT ANGIOGRAPHY CHEST CT ABDOMEN AND PELVIS WITH CONTRAST TECHNIQUE: Multidetector CT imaging of the chest was performed using the standard protocol during bolus administration of intravenous contrast. Multiplanar CT image reconstructions and MIPs were obtained to evaluate the vascular anatomy. Multidetector CT imaging of the abdomen and pelvis was performed using the standard protocol during bolus administration of intravenous contrast. CONTRAST:  142mL ISOVUE-370 IOPAMIDOL (ISOVUE-370) INJECTION 76% COMPARISON:  Chest CT 12/04/2017 FINDINGS: CTA CHEST FINDINGS Cardiovascular: Contrast injection is sufficient to demonstrate satisfactory opacification of the pulmonary arteries to the segmental level. There is no pulmonary embolus. The main pulmonary artery is within normal limits for size. 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 The inter lobar artery is now also occluded. There is calcific aortic atherosclerosis. No dissection. There is a normal variant aortic arch branching pattern with the brachiocephalic and left common carotid arteries sharing a common origin. Heart size is normal, without pericardial effusion. There are large paraspinal venous collaterals. Mediastinum/Nodes: Right-sided mediastinal fat is effaced by the low right suprahilar mass. Multiple subcentimeter bilateral axillary lymph nodes.  Lungs/Pleura: There is debris within the right mainstem bronchus and bronchus intermedius. Right upper lobe bronchus is occluded. There is a large suprahilar right lung mass that appears unchanged in size. The mass is difficult to separate from the surrounding lung. 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 Increased confluent nodularity in the left lower lobe. Clustered nodules in the left upper lobe have increased in size, measuring up to 12 mm. Musculoskeletal: No chest wall abnormality. No acute or significant osseous findings. Review of the MIP images confirms the above findings. CT ABDOMEN and PELVIS FINDINGS Hepatobiliary: Normal hepatic contours and density. No visible biliary dilatation. Normal gallbladder. Pancreas: Normal contours without ductal dilatation. No peripancreatic fluid collection. Spleen: Normal. Adrenals/Urinary Tract: Normal adrenal glands. 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 Stomach/Bowel: Matted bowel in the lower abdomen and pelvis. No dilatation or  inflammation visualized. Vascular/Lymphatic: Atherosclerotic calcification is present within the non-aneurysmal abdominal aorta, without hemodynamically significant stenosis. Major abdominal veins are patent. No abdominal or pelvic lymphadenopathy. Reproductive: There are multiple enhancing nodules within the posterior left pelvis. Difficult to accurately localize these lesions due to crowding pelvic structures, but these may be fibroids within the uterine wall. Musculoskeletal. No bony spinal canal stenosis or focal osseous abnormality. Other: None. IMPRESSION: 1. 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 2. Debris within the right mainstem bronchus.  Right upper lobe atelectasis. 3. No acute abnormality of the abdomen or pelvis. Matted appearance of the small bowel and colon without dilatation or inflammation. 4. Enhancing nodular foci along the posterior left pelvis. Exact location is not entirely clear, but these are suspected to be subserosal fibroids of the left uterine fundus. Aortic Atherosclerosis (ICD10-I70.0). Electronically Signed   By: Ulyses Jarred M.D.   On: 03/17/2018 19:14   Ct Biopsy  Result Date: 03/19/2018 INDICATION: 62 year old female with a history right upper lobe tumor. She has been referred for biopsy. EXAM: CT BIOPSY MEDICATIONS: None. ANESTHESIA/SEDATION: Moderate (conscious) sedation was employed during this procedure. A total of Versed 1.0 mg and Fentanyl 50 mcg was administered intravenously. Moderate Sedation Time: 10 minutes. The patient's level of consciousness and vital signs were monitored continuously by radiology nursing throughout the procedure under my direct supervision. FLUOROSCOPY TIME:  CT COMPLICATIONS: None PROCEDURE: The procedure, risks, benefits, and alternatives were explained to the patient and the patient's family. Specific risks that were addressed included bleeding, infection, pneumothorax, need for further procedure including chest tube placement, chance of delayed pneumothorax or hemorrhage, hemoptysis, nondiagnostic sample, cardiopulmonary collapse, death. Questions regarding the procedure were encouraged and answered. The patient understands and consents to the procedure. Patient was positioned in the supine position on the CT gantry table and a scout CT of the chest was performed for planning purposes. Once angle of approach was determined, the skin and subcutaneous tissues this scan was prepped and draped in the usual sterile fashion, and a sterile drape was applied covering the operative field. A sterile gown and sterile gloves were used for the procedure. Local anesthesia was provided with 1%  Lidocaine. The skin and subcutaneous tissues were infiltrated 1% lidocaine for local anesthesia, and a small stab incision was made with an 11 blade scalpel. Using CT guidance, a 17 gauge trocar needle was advanced into the safest aspect of the right upper lobetarget. After confirmation of the tip, the stylet was withdrawn. Immediately serous fluid returned, tidling with the breathing. A single 18 gauge core biopsy was attempted with no tissue. At this time we withdrew from the case and removed the needle. Patient tolerated the procedure well and remained hemodynamically stable throughout. No complications were encountered and no significant blood loss was encounter IMPRESSION: Status post CT-guided attempt at right upper lobe mass biopsy. The central portion of the tumor was avoided given the high risk, and the peripheral aspect of the tumor was avoided given the high likelihood of atelectatic/postobstructive lung. In the absence of a correlative PET-CT, the needle was placed in the central/safest portion of the tumor, with discovery of frankly serous fluid/cyst. No sample was achieved. Signed, Dulcy Fanny. Dellia Nims, RPVI [facility] Radiology Electronically Signed   By:  Corrie Mckusick D.O.   On: 03/19/2018 08:13   Dg Chest Port 1 View  Result Date: 03/18/2018 CLINICAL DATA:  Productive cough with clear mucus. Smoker since age 9. Lung mass. EXAM: PORTABLE CHEST 1 VIEW COMPARISON:  03/17/2018 CT and CXR FINDINGS: Unchanged pulmonary mass occupying much of the right upper thorax. Emphysematous hyperinflation of. Heart and mediastinal contours to the extent visualized appear stable. No aggressive osseous lesions. IMPRESSION: Redemonstration of right upper pulmonary mass in the setting of COPD. No significant change. Electronically Signed   By: Ashley Royalty M.D.   On: 03/18/2018 20:44        Scheduled Meds: . amLODipine  10 mg Oral Daily  .  dextromethorphan-guaiFENesin  1 tablet Oral BID  . enoxaparin (LOVENOX) injection  40 mg Subcutaneous Q24H  . ipratropium-albuterol  3 mL Nebulization Q6H  . lisinopril  20 mg Oral Daily  . mometasone-formoterol  2 puff Inhalation BID  . multivitamin with minerals  1 tablet Oral Daily   Continuous Infusions: . sodium chloride 125 mL/hr (03/19/18 0500)  . potassium PHOSPHATE IVPB (in mmol) 30 mmol (03/19/18 0943)     LOS: 1 day    Time spent: 40 minutes    Irine Seal, MD [facility] Pager [phone]  If 7PM-7AM, please contact night-coverage [email or web address] Password [email or web address] 03/19/2018, 9:54 AM

## 2018-03-19 NOTE — Telephone Encounter (Signed)
Message received from Dessa Phi, RN CM requesting a hospital follow up appointment for patient at [facility].  An appointment was scheduled for 03/23/18 @ 1350 @ [employer] and the information was placed on the AVS.  Update provided to K. Mahabir, RN CM

## 2018-03-19 NOTE — Progress Notes (Signed)
Initial Nutrition Assessment  DOCUMENTATION CODES:   Underweight(Will assess for malnutrition at follow-up.)  INTERVENTION:  - Will order Ensure Enlive po BID, each supplement provides 350 kcal and 20 grams of protein - Continue to encourage PO intakes. - Will attempt NFPE and to obtain PTA information at follow-up.   NUTRITION DIAGNOSIS:   Increased nutrient needs related to chronic illness, catabolic illness as evidenced by estimated needs.  GOAL:   Patient will meet greater than or equal to 90% of their needs  MONITOR:   PO intake, Supplement acceptance, Weight trends, Labs  REASON FOR ASSESSMENT:   Other (Comment)(underweight BMI)  ASSESSMENT:   62 y.o. female with medical history significant of right lung mass presenting to the hospital for evaluation of SOB. 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 Patient was offered admission for further work-up at that time but declined. Patient statesd she did not follow-up with a doctor since her ED discharge in July. She continued to smoke cigarettes and quit a week ago. Stated that she has been feeling SOB with exertion and when lying down for the past 2 weeks. Also having a cough productive of white-colored sputum. Symptoms have been getting progressively worse. She has unintentionally lost 5 to 6 pounds since July.  Patient out of the room x2 attempted visits. No family/visitors present to provide PTA information. Per chart review, current weight is 104 lb and no other weight in the chart since 06/20/15 when patient weighed 128 lb. H&P indicates that patient reported 5-6 lb weight loss since 11/2017; will need to obtain more information at follow-up.  Patient underwent RUL biopsy yesterday evening. Oncology is following patient but no notes from that service or Hospitalist yet today.    Medications reviewed; 60 g Solu-medrol BID,  daily multivitamin with minerals, 30 mmol IV KPhos x1 run today.  Labs reviewed; Na: 134 mmol/L, BUN: 7 mg/dL, Ca: 10.9 mg/dL. IVF; NS @ 125 mL/hr.     NUTRITION - FOCUSED PHYSICAL EXAM:  Will attempt at follow-up.   Diet Order:   Diet Order            Diet Heart Room service appropriate? Yes; Fluid consistency: Thin  Diet effective now              EDUCATION NEEDS:   No education needs have been identified at this time  Skin:  Skin Assessment: Reviewed RN Assessment  Last BM:  10/29  Height:   Ht Readings from Last 1 Encounters:  03/17/18 5\' 9"  (1.753 m)    Weight:   Wt Readings from Last 1 Encounters:  03/17/18 47.2 kg    Ideal Body Weight:  65.91 kg  BMI:  Body mass index is 15.36 kg/m.  Estimated Nutritional Needs:   Kcal:  1650-1890 (35-40 kcal/kg)  Protein:  70-85 grams (1.5-1.8 grams/kg)  Fluid:  >/= 1.7 L/day     Jarome Matin, MS, RD, LDN, [facility] Inpatient Clinical Dietitian Pager # [phone] After hours/weekend pager # [phone]

## 2018-03-20 ENCOUNTER — Inpatient Hospital Stay (HOSPITAL_COMMUNITY): Payer: Medicaid Other

## 2018-03-20 ENCOUNTER — Encounter (HOSPITAL_COMMUNITY): Payer: Self-pay | Admitting: Cardiovascular Disease

## 2018-03-20 DIAGNOSIS — I4891 Unspecified atrial fibrillation: Secondary | ICD-10-CM | POA: Diagnosis not present

## 2018-03-20 DIAGNOSIS — R000 Tachycardia, unspecified: Secondary | ICD-10-CM | POA: Diagnosis not present

## 2018-03-20 DIAGNOSIS — I5030 Unspecified diastolic (congestive) heart failure: Secondary | ICD-10-CM

## 2018-03-20 LAB — ECHOCARDIOGRAM COMPLETE
HEIGHTINCHES: 69 in
Weight: 1664 oz

## 2018-03-20 LAB — CBC WITH DIFFERENTIAL/PLATELET
ABS IMMATURE GRANULOCYTES: 0.07 10*3/uL (ref 0.00–0.07)
Basophils Absolute: 0 10*3/uL (ref 0.0–0.1)
Basophils Relative: 0 %
Eosinophils Absolute: 0 10*3/uL (ref 0.0–0.5)
Eosinophils Relative: 0 %
HCT: 27.9 % — ABNORMAL LOW (ref 36.0–46.0)
HEMOGLOBIN: 8.9 g/dL — AB (ref 12.0–15.0)
Immature Granulocytes: 1 %
LYMPHS PCT: 4 %
Lymphs Abs: 0.5 10*3/uL — ABNORMAL LOW (ref 0.7–4.0)
MCH: 30.1 pg (ref 26.0–34.0)
MCHC: 31.9 g/dL (ref 30.0–36.0)
MCV: 94.3 fL (ref 80.0–100.0)
MONO ABS: 0.3 10*3/uL (ref 0.1–1.0)
Monocytes Relative: 2 %
NEUTROS ABS: 12.8 10*3/uL — AB (ref 1.7–7.7)
Neutrophils Relative %: 93 %
Platelets: 292 10*3/uL (ref 150–400)
RBC: 2.96 MIL/uL — AB (ref 3.87–5.11)
RDW: 13.3 % (ref 11.5–15.5)
WBC: 13.7 10*3/uL — AB (ref 4.0–10.5)
nRBC: 0 % (ref 0.0–0.2)

## 2018-03-20 LAB — BASIC METABOLIC PANEL
ANION GAP: 6 (ref 5–15)
BUN: 10 mg/dL (ref 8–23)
CHLORIDE: 100 mmol/L (ref 98–111)
CO2: 25 mmol/L (ref 22–32)
Calcium: 11.1 mg/dL — ABNORMAL HIGH (ref 8.9–10.3)
Creatinine, Ser: 0.55 mg/dL (ref 0.44–1.00)
GFR calc non Af Amer: >60 mL/min (ref >60)
Glucose, Bld: 142 mg/dL — ABNORMAL HIGH (ref 70–99)
POTASSIUM: 4.3 mmol/L (ref 3.5–5.1)
SODIUM: 131 mmol/L — AB (ref 135–145)

## 2018-03-20 LAB — TSH: TSH: 1.354 u[IU]/mL (ref 0.350–4.500)

## 2018-03-20 LAB — PHOSPHORUS: Phosphorus: 3.3 mg/dL (ref 2.5–4.6)

## 2018-03-20 LAB — TROPONIN I

## 2018-03-20 MED ORDER — METOPROLOL TARTRATE 5 MG/5ML IV SOLN: 5.0000 mg | INTRAVENOUS | Status: DC | PRN

## 2018-03-20 MED ORDER — ZOLEDRONIC ACID 4 MG/5ML IV CONC
4.0000 mg | Freq: Once | INTRAVENOUS | Status: AC
  Administered 2018-03-20: 4 mg via INTRAVENOUS
  Filled 2018-03-20: qty 5

## 2018-03-20 MED ORDER — IPRATROPIUM BROMIDE 0.02 % IN SOLN
0.5000 mg | Freq: Three times a day (TID) | RESPIRATORY_TRACT | Status: DC
  Administered 2018-03-20 – 2018-03-28 (×23): 0.5 mg via RESPIRATORY_TRACT
  Filled 2018-03-20 (×24): qty 2.5

## 2018-03-20 MED ORDER — DILTIAZEM LOAD VIA INFUSION
10.0000 mg | Freq: Once | INTRAVENOUS | Status: AC
  Administered 2018-03-20: 10 mg via INTRAVENOUS
  Filled 2018-03-20: qty 10

## 2018-03-20 MED ORDER — DILTIAZEM HCL-DEXTROSE 100-5 MG/100ML-% IV SOLN (PREMIX)
5.0000 mg/h | INTRAVENOUS | Status: DC
  Administered 2018-03-20: 5 mg/h via INTRAVENOUS
  Filled 2018-03-20: qty 100

## 2018-03-20 MED ORDER — FUROSEMIDE 10 MG/ML IJ SOLN
20.0000 mg | Freq: Every day | INTRAMUSCULAR | Status: DC
  Administered 2018-03-20 – 2018-03-21 (×2): 20 mg via INTRAVENOUS
  Filled 2018-03-20 (×2): qty 2

## 2018-03-20 MED ORDER — SODIUM CHLORIDE 0.9 % IV SOLN
INTRAVENOUS | Status: DC
  Administered 2018-03-20 – 2018-03-22 (×8): via INTRAVENOUS

## 2018-03-20 MED ORDER — LEVALBUTEROL HCL 0.63 MG/3ML IN NEBU: 0.6300 mg | INHALATION_SOLUTION | RESPIRATORY_TRACT | Status: DC | PRN

## 2018-03-20 MED ORDER — LEVALBUTEROL HCL 0.63 MG/3ML IN NEBU
0.6300 mg | INHALATION_SOLUTION | Freq: Three times a day (TID) | RESPIRATORY_TRACT | Status: DC
  Administered 2018-03-20 – 2018-03-28 (×23): 0.63 mg via RESPIRATORY_TRACT
  Filled 2018-03-20 (×23): qty 3

## 2018-03-20 MED ORDER — DILTIAZEM HCL 30 MG PO TABS
30.0000 mg | ORAL_TABLET | Freq: Four times a day (QID) | ORAL | Status: DC
  Administered 2018-03-20 – 2018-03-21 (×4): 30 mg via ORAL
  Filled 2018-03-20 (×4): qty 1

## 2018-03-20 MED ORDER — IPRATROPIUM BROMIDE 0.02 % IN SOLN
0.5000 mg | RESPIRATORY_TRACT | Status: DC | PRN
  Filled 2018-03-20: qty 2.5

## 2018-03-20 MED ORDER — CALCITONIN (SALMON) 200 UNIT/ACT NA SOLN
1.0000 | Freq: Every day | NASAL | Status: AC
  Administered 2018-03-20 – 2018-03-22 (×3): 1 via NASAL
  Filled 2018-03-20: qty 3.7

## 2018-03-20 NOTE — Evaluation (Signed)
Clinical/Bedside Swallow Evaluation Patient Details  Name: Sonya [name] MRN: [id number] Date of Birth: [birth year]  Today's Date: 03/20/2018 Time: SLP Start Time (ACUTE ONLY): 1121 SLP Stop Time (ACUTE ONLY): 1130 SLP Time Calculation (min) (ACUTE ONLY): 9 min  Past Medical History:  Past Medical History:  Diagnosis Date  . Bronchitis   . Chronic back pain   . Scoliosis    Past Surgical History: History reviewed. No pertinent surgical history. HPI:  62 y.o. female with medical history significant of right lung mass presenting to the hospital for evaluation of SOB. 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 Patient was offered admission for further work-up at that time but declined. Patient statesd she did not follow-up with a doctor since her ED discharge in July. She continued to smoke cigarettes and quit a week ago. Stated that she has been feeling SOB with exertion and when lying down for the past 2 weeks. Also having a cough productive of white-colored sputum. Symptoms have been getting progressively worse. She has unintentionally lost 5 to 6 pounds since July.   Assessment / Plan / Recommendation Clinical Impression  Patient presents with a normal appearing oropharyngeal swallow without overt indication fo aspiration, denies swallowing difficulty. No f/u SLP services necessary unless MD feels necessary to r/o silent aspiration (no significant risk factors however CXR did note "debris" in right bronchus of unknown origin). Discussed with MD who will keep an eye on lung findings and re-consult if necessary.  SLP Visit Diagnosis: Dysphagia, unspecified (R13.10)    Aspiration Risk       Diet Recommendation Regular;Thin liquid   Liquid Administration via: Cup;Straw Medication Administration: Whole meds with liquid Supervision: Patient able to self feed Compensations: Slow  rate;Small sips/bites Postural Changes: Seated upright at 90 degrees    Other  Recommendations Oral Care Recommendations: Oral care BID   Follow up Recommendations None        Swallow Study   General HPI: 62 y.o. female with medical history significant of right lung mass presenting to the hospital for evaluation of SOB. 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 Patient was offered admission for further work-up at that time but declined. Patient statesd she did not follow-up with a doctor since her ED discharge in July. She continued to smoke cigarettes and quit a week ago. Stated that she has been feeling SOB with exertion and when lying down for the past 2 weeks. Also having a cough productive of white-colored sputum. Symptoms have been getting progressively worse. She has unintentionally lost 5 to 6 pounds since July. Type of Study: Bedside Swallow Evaluation Previous Swallow Assessment: none Diet Prior to this Study: Regular;Thin liquids Temperature Spikes Noted: No Respiratory Status: Nasal cannula History of Recent Intubation: No Behavior/Cognition: Alert;Cooperative;Pleasant mood Oral Cavity Assessment: Within Functional Limits Oral Care Completed by SLP: No Oral Cavity - Dentition: Dentures, top;Adequate natural dentition Vision: Functional for self-feeding Self-Feeding Abilities: Able to feed self Patient Positioning: Upright in bed Baseline Vocal Quality: Normal Volitional Cough: Strong Volitional Swallow: Able to elicit    Oral/Motor/Sensory Function Overall Oral Motor/Sensory Function: Within functional limits   Ice Chips Ice chips: Not tested   Thin Liquid Thin Liquid: Within functional limits Presentation: Cup;Self Fed;Straw    Nectar Thick Nectar Thick Liquid: Not tested   Honey Thick  Honey Thick Liquid: Not tested   Puree Puree: Within functional  limits Presentation: Spoon;Self Fed   Solid     Solid: Within functional limits Presentation: [place], [place] 03/20/2018,11:37 AM

## 2018-03-20 NOTE — Progress Notes (Signed)
Cardiology Consultation:   Patient ID: Sonya [name] MRN: [id number]; DOB: 1955-10-10  Admit date: 03/17/2018 Date of Consult: 03/20/2018  Primary Care Provider: Default, Provider, MD Primary Cardiologist: No primary care provider on file. New Primary Electrophysiologist:  None    Patient Profile:   Sonya [name] is a 62 y.o. female with a history of lung mass, tobacco abuse, COPD and HTN who is being seen today for the evaluation of new onset atrial fibrillation with RVR at the request of Dr. Grandville Silos.   History of Present Illness:   Sonya [name] is a 62 yo female with a history of lung mass, tobacco abuse, COPD and HTN who is being seen today for the evaluation of new onset atrial fibrillation with RVR at the request of Dr. Grandville Silos. She was found to have a right lung mass in July 2019 but did not follow up for further treatment. She continued to smoke cigarettes until last week. She has not taken her anti-hypertensive medications as advised. Now admitted with with worsened dyspnea. 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 Her case has been reviewed with our pulmonary team and oncology consultation is pending. Tissue has not yet been obtained for diagnosis of the mass. She is being treated for a COPD exacerbation currently.  She had been in sinus rhythm until earlier today when she converted to atrial fibrillation with RVR. She was started on IV Cardizem and has now converted to sinus rhythm.  She has c/p dyspnea but no chest pain. Occasional mild lower extremity edema at home.   Past Medical History:  Diagnosis Date  . Atrial fibrillation ([place])   . Bronchitis   . Chronic back pain   . COPD (chronic obstructive pulmonary disease) ([place])   . HTN (hypertension)   . Scoliosis     Past Surgical History: She has  never had a surgical procedure  Home Medications:  Prior to Admission medications   Medication Sig Start Date End Date Taking? Authorizing Provider  ibuprofen (ADVIL,MOTRIN) 200 MG tablet Take 200-400 mg by mouth every 6 (six) hours as needed for headache or moderate pain.   Yes [provider]  Multiple Vitamin (MULTIVITAMIN WITH MINERALS) TABS tablet Take 1 tablet by mouth daily.   Yes [provider]  oxyCODONE-acetaminophen (PERCOCET/ROXICET) 5-325 MG tablet Take 1 tablet by mouth every 8 (eight) hours as needed for severe pain. Patient not taking: Reported on 03/17/2018 12/04/17   Joanne Gavel, PA-C    Inpatient Medications: Scheduled Meds: . budesonide (PULMICORT) nebulizer solution  0.5 mg Nebulization BID  . calcitonin (salmon)  1 spray Alternating Nares Daily  . dextromethorphan-guaiFENesin  1 tablet Oral BID  . enoxaparin (LOVENOX) injection  40 mg Subcutaneous Q24H  . feeding supplement (ENSURE ENLIVE)  237 mL Oral BID BM  . furosemide  20 mg Intravenous Daily  . ipratropium  0.5 mg Nebulization TID  . levalbuterol  0.63 mg Nebulization TID  . levofloxacin  500 mg Oral Daily  . methylPREDNISolone (SOLU-MEDROL) injection  60 mg Intravenous Q12H  . multivitamin with minerals  1 tablet Oral Daily   Continuous Infusions: . sodium chloride 125 mL/hr at 03/20/18 0916  . diltiazem (CARDIZEM) infusion 10 mg/hr (03/20/18 1257)   PRN Meds: chlorpheniramine-HYDROcodone, hydrALAZINE, ipratropium, levalbuterol  Allergies:   No Known Allergies  Social History:   Social History   Socioeconomic History  .  Marital status: Divorced    Spouse name: Not on file  . Number of children: Not on file  . Years of education: Not on file  . Highest education level: Not on file  Occupational History  . Not on file  Social Needs  . Financial resource strain: Not on file  . Food insecurity:    Worry: Not on file    Inability: Not on file  . Transportation needs:     Medical: Not on file    Non-medical: Not on file  Tobacco Use  . Smoking status: Current Every Day Smoker    Packs/day: 0.25  . Smokeless tobacco: Never Used  Substance and Sexual Activity  . Alcohol use: Not Currently  . Drug use: No  . Sexual activity: Not on file  Lifestyle  . Physical activity:    Days per week: Not on file    Minutes per session: Not on file  . Stress: Not on file  Relationships  . Social connections:    Talks on phone: Not on file    Gets together: Not on file    Attends religious service: Not on file    Active member of club or organization: Not on file    Attends meetings of clubs or organizations: Not on file    Relationship status: Not on file  . Intimate partner violence:    Fear of current or ex partner: Not on file    Emotionally abused: Not on file    Physically abused: Not on file    Forced sexual activity: Not on file  Other Topics Concern  . Not on file  Social History Narrative  . Not on file    Family History:   Family History  Problem Relation Age of Onset  . Heart disease Brother      ROS:  Please see the history of present illness.  All other ROS reviewed and negative.     Physical Exam/Data:   Vitals:   03/20/18 1143 03/20/18 1221 03/20/18 1258 03/20/18 1331  BP: (!) 133/94 (!) 140/94 (!) 149/86 (!) 160/134  Pulse: (!) 145 (!) 133 (!) 135 (!) 102  Resp: 18  20   Temp: 98.7 F (37.1 C)     TempSrc:      SpO2: 93% 100% 100% 100%  Weight:      Height:        Intake/Output Summary (Last 24 hours) at 03/20/2018 1336 Last data filed at 03/20/2018 0600 Gross per 24 hour  Intake 2300.45 ml  Output 500 ml  Net 1800.45 ml   Filed Weights   03/17/18 1612 03/17/18 2131  Weight: 56.7 kg 47.2 kg   Body mass index is 15.36 kg/m.  General:  Thin female in NAD.  HEENT: normal Lymph: no adenopathy Neck: no JVD Endocrine:  No thryomegaly Vascular: No carotid bruits; FA pulses 2+ bilaterally without bruits  Cardiac:  Regular, tachy. No murmur  Lungs: Decreased BS right lung. Clear left lung. No wheezes.  Abd: soft, nontender, no hepatomegaly  Ext: no edema Musculoskeletal:  No deformities, BUE and BLE strength normal and equal Skin: warm and dry  Neuro:  CNs 2-12 intact, no focal abnormalities noted Psych:  Normal affect   EKG:  The EKG was personally reviewed and demonstrates:  Atrial fib with rate 133 bpm. Poor R wave progression across the precordial leads.  Telemetry:  Telemetry was personally reviewed and demonstrates:  Sinus tachycardia  Relevant CV Studies:  Laboratory Data:  Chemistry  Recent Labs  Lab 03/18/18 0826 03/19/18 0533 03/20/18 0552  NA 132* 134* 131*  K 3.5 4.3 4.3  CL 101 101 100  CO2 24 25 25   GLUCOSE 101* 105* 142*  BUN 8 7* 10  CREATININE 0.64 0.61 0.55  CALCIUM 10.7*  10.9* 10.9* 11.1*  GFRNONAA >60 >60 >60  GFRAA >60 >60 >60  ANIONGAP 7 8 6     Recent Labs  Lab 03/17/18 1700 03/18/18 0826 03/19/18 0533  PROT 7.8 7.7 7.5  ALBUMIN 2.9* 3.0* 3.0*  AST 12* 11* 12*  ALT 8 8 8   ALKPHOS 69 64 61  BILITOT 0.6 0.5 0.8   Hematology Recent Labs  Lab 03/18/18 0256 03/19/18 0533 03/20/18 0552  WBC 9.0 12.3* 13.7*  RBC 2.87* 2.82* 2.96*  HGB 8.6* 8.5* 8.9*  HCT 27.4* 26.9* 27.9*  MCV 95.5 95.4 94.3  MCH 30.0 30.1 30.1  MCHC 31.4 31.6 31.9  RDW 13.4 13.5 13.3  PLT 310 306 292   Cardiac Enzymes Recent Labs  Lab 03/20/18 1206  TROPONINI <0.03    Recent Labs  Lab 03/17/18 1705  TROPIPOC 0.00    BNPNo results for input(s): BNP, PROBNP in the last 168 hours.  DDimer No results for input(s): DDIMER in the last 168 hours.  Radiology/Studies:  Dg Chest 2 View  Result Date: 03/17/2018 CLINICAL DATA:  Shortness of breath EXAM: CHEST - 2 VIEW COMPARISON:  Chest CT 12/04/2017 FINDINGS: Consolidation of the right upper lobe consistent with known upper lobe mass and likely postobstructive atelectasis. No pleural effusion. Heart size normal. The left lung  is clear. IMPRESSION: Known right suprahilar mass with associated upper lobe atelectasis. Lungs are otherwise clear. Electronically Signed   By: Ulyses Jarred M.D.   On: 03/17/2018 18:01   Ct Angio Chest Pe W And/or Wo Contrast  Result Date: 03/17/2018 CLINICAL DATA:  Shortness of breath and back pain. Known right lung mass. EXAM: CT ANGIOGRAPHY CHEST CT ABDOMEN AND PELVIS WITH CONTRAST TECHNIQUE: Multidetector CT imaging of the chest was performed using the standard protocol during bolus administration of intravenous contrast. Multiplanar CT image reconstructions and MIPs were obtained to evaluate the vascular anatomy. Multidetector CT imaging of the abdomen and pelvis was performed using the standard protocol during bolus administration of intravenous contrast. CONTRAST:  175mL ISOVUE-370 IOPAMIDOL (ISOVUE-370) INJECTION 76% COMPARISON:  Chest CT 12/04/2017 FINDINGS: CTA CHEST FINDINGS Cardiovascular: Contrast injection is sufficient to demonstrate satisfactory opacification of the pulmonary arteries to the segmental level. There is no pulmonary embolus. The main pulmonary artery is within normal limits for size. 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 The inter lobar artery is now also occluded. There is calcific aortic atherosclerosis. No dissection. There is a normal variant aortic arch branching pattern with the brachiocephalic and left common carotid arteries sharing a common origin. Heart size is normal, without pericardial effusion. There are large paraspinal venous collaterals. Mediastinum/Nodes: Right-sided mediastinal fat is effaced by the low right suprahilar mass. Multiple subcentimeter bilateral axillary lymph nodes. Lungs/Pleura: There is debris within the right mainstem bronchus and bronchus intermedius. Right upper lobe bronchus is occluded. There is a large suprahilar right lung mass that appears unchanged in  size. The mass is difficult to separate from the surrounding lung. 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 Increased confluent nodularity in the left lower lobe. Clustered nodules in the left upper lobe have increased in size, measuring  up to 12 mm. Musculoskeletal: No chest wall abnormality. No acute or significant osseous findings. Review of the MIP images confirms the above findings. CT ABDOMEN and PELVIS FINDINGS Hepatobiliary: Normal hepatic contours and density. No visible biliary dilatation. Normal gallbladder. Pancreas: Normal contours without ductal dilatation. No peripancreatic fluid collection. Spleen: Normal. Adrenals/Urinary Tract: Normal adrenal glands. 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 Stomach/Bowel: Matted bowel in the lower abdomen and pelvis. No dilatation or inflammation visualized. Vascular/Lymphatic: Atherosclerotic calcification is present within the non-aneurysmal abdominal aorta, without hemodynamically significant stenosis. Major abdominal veins are patent. No abdominal or pelvic lymphadenopathy. Reproductive: There are multiple enhancing nodules within the posterior left pelvis. Difficult to accurately localize these lesions due to crowding pelvic structures, but these may be fibroids within the uterine wall. Musculoskeletal. No bony spinal canal stenosis or focal osseous abnormality. Other: None. IMPRESSION: 1. 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 2. Debris within the right mainstem bronchus. Right upper lobe atelectasis. 3. No acute abnormality of the abdomen or pelvis. Matted appearance of the small bowel and colon without dilatation or inflammation. 4. Enhancing nodular foci along the  posterior left pelvis. Exact location is not entirely clear, but these are suspected to be subserosal fibroids of the left uterine fundus. Aortic Atherosclerosis (ICD10-I70.0). Electronically Signed   By: Ulyses Jarred M.D.   On: 03/17/2018 19:14   Mr Brain Wo Contrast  Result Date: 03/18/2018 CLINICAL DATA:  Status post lung mass biopsy, lung cancer. EXAM: MRI HEAD WITHOUT CONTRAST TECHNIQUE: Multiplanar, multiecho pulse sequences of the brain and surrounding structures were obtained without intravenous contrast. Due to cough and motion, patient was unable to tolerate further imaging and postcontrast images not obtained. COMPARISON:  CT HEAD June 13, 2015 FINDINGS: Variable motion degraded examination, severely motion degraded axial MPGR and axial T1. INTRACRANIAL CONTENTS: No reduced diffusion to suggest acute ischemia or hypercellular tumor. No susceptibility artifact to suggest hemorrhage though sequence is severely motion degraded. The ventricles and sulci are normal for patient's age. Patchy supratentorial and pontine white matter FLAIR T2 hyperintensities. No suspicious parenchymal signal, masses, mass effect. No abnormal extra-axial fluid collections. No extra-axial masses. VASCULAR: Normal major intracranial vascular flow voids present at skull base. SKULL AND UPPER CERVICAL SPINE: No abnormal sellar expansion. No suspicious calvarial bone marrow signal. Craniocervical junction maintained. SINUSES/ORBITS: Mild sphenoid sinus mucosal thickening. Mastoid air cells are well aerated. Included ocular globes and orbital contents are non-suspicious. OTHER: None. IMPRESSION: 1. Motion degraded examination without acute intracranial process. No identified intracranial metastasis though postcontrast sequences would be more sensitive. 2. Mild-to-moderate chronic small vessel ischemic changes. Electronically Signed   By: Elon Alas M.D.   On: 03/18/2018 20:55   Ct Abdomen Pelvis W Contrast  Result  Date: 03/17/2018 CLINICAL DATA:  Shortness of breath and back pain. Known right lung mass. EXAM: CT ANGIOGRAPHY CHEST CT ABDOMEN AND PELVIS WITH CONTRAST TECHNIQUE: Multidetector CT imaging of the chest was performed using the standard protocol during bolus administration of intravenous contrast. Multiplanar CT image reconstructions and MIPs were obtained to evaluate the vascular anatomy. Multidetector CT imaging of the abdomen and pelvis was performed using the standard protocol during bolus administration of intravenous contrast. CONTRAST:  143mL ISOVUE-370 IOPAMIDOL (ISOVUE-370) INJECTION 76% COMPARISON:  Chest CT 12/04/2017 FINDINGS: CTA CHEST FINDINGS Cardiovascular: Contrast  injection is sufficient to demonstrate satisfactory opacification of the pulmonary arteries to the segmental level. There is no pulmonary embolus. The main pulmonary artery is within normal limits for size. 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 The inter lobar artery is now also occluded. There is calcific aortic atherosclerosis. No dissection. There is a normal variant aortic arch branching pattern with the brachiocephalic and left common carotid arteries sharing a common origin. Heart size is normal, without pericardial effusion. There are large paraspinal venous collaterals. Mediastinum/Nodes: Right-sided mediastinal fat is effaced by the low right suprahilar mass. Multiple subcentimeter bilateral axillary lymph nodes. Lungs/Pleura: There is debris within the right mainstem bronchus and bronchus intermedius. Right upper lobe bronchus is occluded. There is a large suprahilar right lung mass that appears unchanged in size. The mass is difficult to separate from the surrounding lung. 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 Increased confluent nodularity in the left lower lobe. Clustered nodules in the left upper lobe have  increased in size, measuring up to 12 mm. Musculoskeletal: No chest wall abnormality. No acute or significant osseous findings. Review of the MIP images confirms the above findings. CT ABDOMEN and PELVIS FINDINGS Hepatobiliary: Normal hepatic contours and density. No visible biliary dilatation. Normal gallbladder. Pancreas: Normal contours without ductal dilatation. No peripancreatic fluid collection. Spleen: Normal. Adrenals/Urinary Tract: Normal adrenal glands. 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 Stomach/Bowel: Matted bowel in the lower abdomen and pelvis. No dilatation or inflammation visualized. Vascular/Lymphatic: Atherosclerotic calcification is present within the non-aneurysmal abdominal aorta, without hemodynamically significant stenosis. Major abdominal veins are patent. No abdominal or pelvic lymphadenopathy. Reproductive: There are multiple enhancing nodules within the posterior left pelvis. Difficult to accurately localize these lesions due to crowding pelvic structures, but these may be fibroids within the uterine wall. Musculoskeletal. No bony spinal canal stenosis or focal osseous abnormality. Other: None. IMPRESSION: 1. 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 2. Debris within the right mainstem bronchus. Right upper lobe atelectasis. 3. No acute abnormality of the abdomen or pelvis. Matted appearance of the small bowel and colon without dilatation or inflammation. 4. Enhancing nodular foci along the posterior left pelvis. Exact location is not entirely clear, but these are suspected to be subserosal fibroids of the left uterine fundus. Aortic Atherosclerosis (ICD10-I70.0). Electronically Signed   By: Ulyses Jarred M.D.   On: 03/17/2018  19:14   Ct Biopsy  Result Date: 03/19/2018 INDICATION: 62 year old female with a history right upper lobe tumor. She has been referred for biopsy. EXAM: CT BIOPSY MEDICATIONS: None. ANESTHESIA/SEDATION: Moderate (conscious) sedation was employed during this procedure. A total of Versed 1.0 mg and Fentanyl 50 mcg was administered intravenously. Moderate Sedation Time: 10 minutes. The patient's level of consciousness and vital signs were monitored continuously by radiology nursing throughout the procedure under my direct supervision. FLUOROSCOPY TIME:  CT COMPLICATIONS: None PROCEDURE: The procedure, risks, benefits, and alternatives were explained to the patient and the patient's family. Specific risks that were addressed included bleeding, infection, pneumothorax, need for further procedure including chest tube placement, chance of delayed pneumothorax or hemorrhage, hemoptysis, nondiagnostic sample, cardiopulmonary collapse, death. Questions regarding the procedure were encouraged and answered. The patient understands and consents to the procedure.  Patient was positioned in the supine position on the CT gantry table and a scout CT of the chest was performed for planning purposes. Once angle of approach was determined, the skin and subcutaneous tissues this scan was prepped and draped in the usual sterile fashion, and a sterile drape was applied covering the operative field. A sterile gown and sterile gloves were used for the procedure. Local anesthesia was provided with 1% Lidocaine. The skin and subcutaneous tissues were infiltrated 1% lidocaine for local anesthesia, and a small stab incision was made with an 11 blade scalpel. Using CT guidance, a 17 gauge trocar needle was advanced into the safest aspect of the right upper lobetarget. After confirmation of the tip, the stylet was withdrawn. Immediately serous fluid returned, tidling with the breathing. A single 18 gauge core biopsy was attempted with no  tissue. At this time we withdrew from the case and removed the needle. Patient tolerated the procedure well and remained hemodynamically stable throughout. No complications were encountered and no significant blood loss was encounter IMPRESSION: Status post CT-guided attempt at right upper lobe mass biopsy. The central portion of the tumor was avoided given the high risk, and the peripheral aspect of the tumor was avoided given the high likelihood of atelectatic/postobstructive lung. In the absence of a correlative PET-CT, the needle was placed in the central/safest portion of the tumor, with discovery of frankly serous fluid/cyst. No sample was achieved. Signed, Dulcy Fanny. Dellia Nims, RPVI [facility] Radiology Electronically Signed   By: Corrie Mckusick D.O.   On: 03/19/2018 08:13   Dg Chest Port 1 View  Result Date: 03/18/2018 CLINICAL DATA:  Productive cough with clear mucus. Smoker since age 41. Lung mass. EXAM: PORTABLE CHEST 1 VIEW COMPARISON:  03/17/2018 CT and CXR FINDINGS: Unchanged pulmonary mass occupying much of the right upper thorax. Emphysematous hyperinflation of. Heart and mediastinal contours to the extent visualized appear stable. No aggressive osseous lesions. IMPRESSION: Redemonstration of right upper pulmonary mass in the setting of COPD. No significant change. Electronically Signed   By: Ashley Royalty M.D.   On: 03/18/2018 20:44    Assessment and Plan:   1. Atrial fibrillation with RVR: She is currently in sinus rhythm. Will stop Cardizem drip and start Dilitazem 30 mg po Q6 hours today. If she tolerates, can convert to long acting Cardizem tomorrow. Echo is pending to assess LVEF, exclude structural heart disease. CHADS VASC score of 2. Will need to consider long acting anti-coagulation if she has recurrence of atrial fibrillation. I would not start a DOAC at this time as she will likely be having a biopsy procedure soon.  We will follow  with you.    For questions or updates, please contact [place] Please consult [email or web address] for contact info under   Signed, Lauree Chandler, MD  03/20/2018 1:36 PM

## 2018-03-20 NOTE — Progress Notes (Signed)
PT Cancellation Note  Patient Details Name: Sonya [name] MRN: [id number] DOB: 02-26-1956   Cancelled Treatment:    Reason Eval/Treat Not Completed: Medical issues which prohibited therapy Pt with afib and tachycardia this morning and started on IV Cardizem.  Will hold therapy today and check back as schedule permits.   [name],KATHrine E 03/20/2018, 2:38 PM Carmelia Bake, PT, DPT Acute Rehabilitation Services Office: [phone] Pager: [phone]

## 2018-03-20 NOTE — Progress Notes (Signed)
OT Cancellation Note  Patient Details Name: Sonya [name] MRN: [id number] DOB: [birth year]   Cancelled Treatment:    Reason Eval/Treat Not Completed: Medical issues which prohibited therapy.  Pt in AFib and HR up to 150s.  [name] 03/20/2018, 11:32 AM  Lesle Chris, OTR/L Acute Rehabilitation Services [phone] [facility] pager [phone] office 03/20/2018

## 2018-03-20 NOTE — Progress Notes (Signed)
  Echocardiogram 2D Echocardiogram has been performed.  Sonya [name] 03/20/2018, 3:07 PM

## 2018-03-20 NOTE — Progress Notes (Signed)
PROGRESS NOTE    Sonya [name]  QPY:[id number] DOB: 1956/02/03 DOA: 03/17/2018 PCP: Default, Provider, MD   Brief Narrative:  Sonya [name] is a 62 y.o. female with medical history significant of right lung mass presenting to the hospital for evaluation of shortness of breath. 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  Patient was offered admission for further work-up at that time but she declined it.  Plan was for her to follow-up with her primary care provider. Patient states she did not follow-up with a doctor since her ED discharge in July.  She continued to smoke cigarettes and quit a week ago; history of smoking 1 pack/day for 37 years.  States that she has been feeling short of breath with exertion and when lying down for the past 2 weeks.  Also having a cough productive of white-colored sputum.  Symptoms have been getting progressively worse.  Denies having any hemoptysis.  She has unintentionally lost 5 to 6 pounds since July.  Denies having any chest pain.  Denies having any hematemesis, melena, or hematochezia.  ED Course: Afebrile.  Tachycardic initially but now pulse normal.  Continues to be tachypneic.  Not hypotensive.  Satting well on room air.  No leukocytosis.  Corrected calcium 12.3.  I-STAT troponin negative.  EKG not suggestive of ACS.  Chest x-ray showing known right suprahilar mass with associated upper lobe atelectasis.  CTA chest showing no pulmonary embolus, however, increased vascular invasion and/or mass-effect by the right suprahilar mass, resulting in new occlusion of the right middle lobe pulmonary artery and persistent right upper lobe pulmonary artery occlusion as well as occlusion/invasion of the superior vena cava.  Debris within the right mainstem bronchus.  Right upper lobe atelectasis.  CT  abdomen pelvis showing no acute abnormality.  ED physician discussed the case with Dr. Jana Hakim from oncology who recommended starting the patient on IV heparin and consulting IR in the morning for biopsy. TRH paged to admit.   Oncology was consulted and awaiting evaluation.  IR was consulted CT biopsy was attempted however tissue was not able to be obtained as serous fluid was what was collected.  Pulmonary was curb sided however felt patient was not a candidate at this time for bronchoscopy due to findings on CT scan and concern of puncturing pulmonary artery bronchoscopy was undertaken. Patient subsequently noted to be in acute COPD exacerbation placed on steroids, nebulizer treatments, antibiotics. On 03/20/2018 patient noted to go into new onset atrial fibrillation.    Assessment & Plan:   Principal Problem:   Pulmonary mass Active Problems:   Chronic anemia   Hypercalcemia   Hypertensive urgency   HTN (hypertension)   Lung mass   Occlusion of pulmonary artery (HCC)   COPD with acute exacerbation (HCC)   Tachycardia   Atrial fibrillation with RVR (HCC)   New onset atrial fibrillation (HCC)  #1 pulmonary mass occluding/invading right pulmonary artery and SVC/lung mass with SVC Patient presented to the ED with 2-week history of worsening dyspnea and cough in addition to on intentional weight loss.  Patient seen in the ED December 04, 2017 CT chest at that time revealed large right upper lobe/suprahilar mass involving right upper lobe bronchus, right upper lobe pulmonary artery and SVC.  Patient refused admission at that time and was subsequently lost to follow-up.  Patient has  not seen a PCP in over 1 to 2 years.  Patient with ongoing tobacco abuse however states quit 1 week ago.  CT angiogram chest done showed increasing vascular invasion and no mass-effect of the right suprahilar mass, resulting in new occlusion of right middle lobe pulmonary artery and persistent right upper lobe pulmonary  artery occlusion as well as occlusion/invasion of the SVC.  Debris noted in the right mainstem bronchus.  Right upper lobe atelectasis.  Case was discussed with pulmonary who looked at the CT findings and did not feel a bronchoscopy at this time will be of any benefit and increased risk, due to external compression from mass.  IR has been consulted to evaluate for possible biopsy.  IR  Was concerned that may not get a good sample as may likely have some necrosis however CT-guided biopsy was done on 03/18/2018, which showed a large cystic area with frank serous fluid returned and no tissue obtained.  IR recommended PET/CT to determine best target versus bronchoscopy as an alternative next step.  Discussed again with pulmonary who feel concerned that bronchoscopy may lead to incidental laceration of pulmonary artery and feel currently risks right now bronchoscopy are high and recommending alternative of PET/CT as recommended per interventional radiology.  Oncology consultation pending.  Continue Mucinex.  Placed on IV Solu-Medrol, scheduled nebs, Pulmicort as patient with chest tightness, some expiratory wheezing and poor air movement.  Taper steroids.  2.  New onset atrial fibrillation Patient noted to have heart rates sustained in the 150s this morning.  Patient tachycardic on exam with irregularly irregular heart rhythm.  Likely secondary to lung mass.  Patient denies any chest pain.  Cycle cardiac enzymes every 6 hours x3.  Check a TSH.  Check a 2D echo.  Placed on a Cardizem drip.  Consult with cardiology for further evaluation and management.  3.  Probable COPD exacerbation Patient with poor air movement.  Shortness of breath on minimal exertion, expiratory wheezing noted on examination on 03/19/2018.  Clinical improvement after patient being started on Pulmicort, scheduled duo nebs, IV Solu-Medrol, Levaquin.  Continue Mucinex.  Continue Tussionex as needed.  Likely transition to oral prednisone tomorrow.   Follow.   4.  Chronic anemia Patient denies any overt bleeding.  No hemoptysis.  No hematemesis.  No melena.  No hematochezia.  Anemia panel with iron level of 14, TIBC decreased at 213.  Ferritin of 103.  Hemoglobin currently at 8.9.  Follow H&H.  Transfusion threshold hemoglobin less than 7.  Oncology evaluation pending.  5.  Hypertensive urgency Patient noted to have systolic blood pressures in the 190s and 180s early on during the hospitalization.  Blood pressure improved on Norvasc and lisinopril. Is noted on review of the chart that patient has had a history of hypertension was supposed to be on antihypertensive medications.  Patient states has not been on antihypertensive medications in over a year and has not seen a PCP since then.  Patient on IV Lasix now and as such we will discontinue lisinopril.  Hydralazine as needed.    6.  Hypercalcemia Corrected calcium was 12.3 on admission.  Concern for underlying malignancy.  Patient asymptomatic.  Phosphorus level 2.2.  Magnesium was 1.7.  Ionized calcium 6.1.  Vitamin D 25 hydroxy was 9.8.  PTH was low at 8.  Continue hydration with IV fluids.  Corrected calcium level at 11.9.  Continue IV fluids.  Placed on Lasix 20 mg IV daily.  Follow urine output.  Give a  dose of IV Zometa.  Will need outpatient follow-up with oncology.     DVT prophylaxis: Lovenox Code Status: Full Family Communication: Updated patient.  No family at bedside. Disposition Plan: Pending work-up.  Likely home once medically stable.  Patient was seen in July and was lost to follow-up.   Consultants:   Interventional radiology  Oncology pending  Procedures:   CT angiogram chest 03/17/2018  CT abdomen and pelvis 03/18/2018  Chest x-ray 03/17/2018  CT-guided biopsy right upper lobe mass--- large cystic area with frank serous fluid returned, no tissue obtained.  Per Dr. Earleen Newport IR 03/18/2018  Antimicrobials:   Levaquin 03/19/2018   Subjective: Sitting up on  the side of the bed.  Patient states some improvement with shortness of breath and breathing.  Patient feeling less tight.  States cough improving.  No chest pain.  No hemoptysis.    Objective: Vitals:   03/20/18 1331 03/20/18 1400 03/20/18 1936 03/20/18 1944  BP: (!) 160/134 (!) 141/83    Pulse: (!) 102     Resp:      Temp:      TempSrc:      SpO2: 100%  95% 98%  Weight:      Height:        Intake/Output Summary (Last 24 hours) at 03/20/2018 2004 Last data filed at 03/20/2018 1630 Gross per 24 hour  Intake 540 ml  Output 500 ml  Net 40 ml   Filed Weights   03/17/18 1612 03/17/18 2131  Weight: 56.7 kg 47.2 kg    Examination:  General exam: NAD. Respiratory system: Fair air movement.  Decreased expiratory wheezing.  Less tight.  Speaking in full sentences.    Cardiovascular system: Irregularly irregular.  Tachycardic.  No JVD.  No lower extremity edema.  Gastrointestinal system: Abdomen is nontender, nondistended, soft, positive bowel sounds.  No rebound.  No guarding.   Central nervous system: Alert and oriented. No focal neurological deficits. Extremities: Symmetric 5 x 5 power. Skin: No rashes, lesions or ulcers Psychiatry: Judgement and insight appear normal. Mood & affect appropriate.     Data Reviewed: I have personally reviewed following labs and imaging studies  CBC: Recent Labs  Lab 03/17/18 1700 03/18/18 0256 03/19/18 0533 03/20/18 0552  WBC 7.4 9.0 12.3* 13.7*  NEUTROABS 5.8  --   --  12.8*  HGB 9.0* 8.6* 8.5* 8.9*  HCT 28.0* 27.4* 26.9* 27.9*  MCV 94.6 95.5 95.4 94.3  PLT 306 310 306 431   Basic Metabolic Panel: Recent Labs  Lab 03/17/18 1700 03/18/18 0826 03/18/18 1734 03/19/18 0533 03/20/18 0552  NA 134* 132*  --  134* 131*  K 3.8 3.5  --  4.3 4.3  CL 100 101  --  101 100  CO2 26 24  --  25 25  GLUCOSE 107* 101*  --  105* 142*  BUN 9 8  --  7* 10  CREATININE 0.56 0.64  --  0.61 0.55  CALCIUM 11.4* 10.7*  10.9*  --  10.9* 11.1*  MG   --  1.7  --  2.1  --   PHOS  --   --  2.2*  --  3.3   GFR: Estimated Creatinine Clearance: 54.3 mL/min (by C-G formula based on SCr of 0.55 mg/dL). Liver Function Tests: Recent Labs  Lab 03/17/18 1700 03/18/18 0826 03/19/18 0533  AST 12* 11* 12*  ALT 8 8 8   ALKPHOS 69 64 61  BILITOT 0.6 0.5 0.8  PROT 7.8 7.7 7.5  ALBUMIN 2.9* 3.0* 3.0*   No results for input(s): LIPASE, AMYLASE in the last 168 hours. No results for input(s): AMMONIA in the last 168 hours. Coagulation Profile: Recent Labs  Lab 03/17/18 2006  INR 1.16   Cardiac Enzymes: Recent Labs  Lab 03/20/18 1206  TROPONINI <0.03   BNP (last 3 results) No results for input(s): PROBNP in the last 8760 hours. HbA1C: No results for input(s): HGBA1C in the last 72 hours. CBG: No results for input(s): GLUCAP in the last 168 hours. Lipid Profile: No results for input(s): CHOL, HDL, LDLCALC, TRIG, CHOLHDL, LDLDIRECT in the last 72 hours. Thyroid Function Tests: Recent Labs    03/20/18 1206  TSH 1.354   Anemia Panel: Recent Labs    03/18/18 0256  FERRITIN 103  TIBC 213*  IRON 14*   Sepsis Labs: No results for input(s): PROCALCITON, LATICACIDVEN in the last 168 hours.  No results found for this or any previous visit (from the past 240 hour(s)).       Radiology Studies: Mr Brain 42 Contrast  Result Date: 03/18/2018 CLINICAL DATA:  Status post lung mass biopsy, lung cancer. EXAM: MRI HEAD WITHOUT CONTRAST TECHNIQUE: Multiplanar, multiecho pulse sequences of the brain and surrounding structures were obtained without intravenous contrast. Due to cough and motion, patient was unable to tolerate further imaging and postcontrast images not obtained. COMPARISON:  CT HEAD June 13, 2015 FINDINGS: Variable motion degraded examination, severely motion degraded axial MPGR and axial T1. INTRACRANIAL CONTENTS: No reduced diffusion to suggest acute ischemia or hypercellular tumor. No susceptibility artifact to suggest  hemorrhage though sequence is severely motion degraded. The ventricles and sulci are normal for patient's age. Patchy supratentorial and pontine white matter FLAIR T2 hyperintensities. No suspicious parenchymal signal, masses, mass effect. No abnormal extra-axial fluid collections. No extra-axial masses. VASCULAR: Normal major intracranial vascular flow voids present at skull base. SKULL AND UPPER CERVICAL SPINE: No abnormal sellar expansion. No suspicious calvarial bone marrow signal. Craniocervical junction maintained. SINUSES/ORBITS: Mild sphenoid sinus mucosal thickening. Mastoid air cells are well aerated. Included ocular globes and orbital contents are non-suspicious. OTHER: None. IMPRESSION: 1. Motion degraded examination without acute intracranial process. No identified intracranial metastasis though postcontrast sequences would be more sensitive. 2. Mild-to-moderate chronic small vessel ischemic changes. Electronically Signed   By: Elon Alas M.D.   On: 03/18/2018 20:55        Scheduled Meds: . budesonide (PULMICORT) nebulizer solution  0.5 mg Nebulization BID  . calcitonin (salmon)  1 spray Alternating Nares Daily  . dextromethorphan-guaiFENesin  1 tablet Oral BID  . diltiazem  30 mg Oral Q6H  . enoxaparin (LOVENOX) injection  40 mg Subcutaneous Q24H  . feeding supplement (ENSURE ENLIVE)  237 mL Oral BID BM  . furosemide  20 mg Intravenous Daily  . ipratropium  0.5 mg Nebulization TID  . levalbuterol  0.63 mg Nebulization TID  . levofloxacin  500 mg Oral Daily  . methylPREDNISolone (SOLU-MEDROL) injection  60 mg Intravenous Q12H  . multivitamin with minerals  1 tablet Oral Daily   Continuous Infusions: . sodium chloride 125 mL/hr at 03/20/18 1752     LOS: 2 days    Time spent: 40 minutes    Irine Seal, MD [facility] Pager [phone]  If 7PM-7AM, please contact night-coverage [email or web address] Password [email or web address] 03/20/2018, 8:04 PM

## 2018-03-21 DIAGNOSIS — J441 Chronic obstructive pulmonary disease with (acute) exacerbation: Secondary | ICD-10-CM

## 2018-03-21 DIAGNOSIS — R918 Other nonspecific abnormal finding of lung field: Secondary | ICD-10-CM

## 2018-03-21 LAB — PHOSPHORUS: PHOSPHORUS: 2 mg/dL — AB (ref 2.5–4.6)

## 2018-03-21 LAB — COMPREHENSIVE METABOLIC PANEL
ALK PHOS: 60 U/L (ref 38–126)
ALT: 11 U/L (ref 0–44)
ANION GAP: 8 (ref 5–15)
AST: 18 U/L (ref 15–41)
Albumin: 2.5 g/dL — ABNORMAL LOW (ref 3.5–5.0)
BUN: 17 mg/dL (ref 8–23)
CALCIUM: 10 mg/dL (ref 8.9–10.3)
CO2: 25 mmol/L (ref 22–32)
CREATININE: 0.56 mg/dL (ref 0.44–1.00)
Chloride: 102 mmol/L (ref 98–111)
Glucose, Bld: 198 mg/dL — ABNORMAL HIGH (ref 70–99)
Potassium: 4.1 mmol/L (ref 3.5–5.1)
Sodium: 135 mmol/L (ref 135–145)
TOTAL PROTEIN: 7.3 g/dL (ref 6.5–8.1)
Total Bilirubin: 0.2 mg/dL — ABNORMAL LOW (ref 0.3–1.2)

## 2018-03-21 LAB — MAGNESIUM: MAGNESIUM: 1.7 mg/dL (ref 1.7–2.4)

## 2018-03-21 LAB — CBC WITH DIFFERENTIAL/PLATELET
Abs Immature Granulocytes: 0.06 10*3/uL (ref 0.00–0.07)
BASOS ABS: 0 10*3/uL (ref 0.0–0.1)
BASOS PCT: 0 %
EOS PCT: 0 %
Eosinophils Absolute: 0 10*3/uL (ref 0.0–0.5)
HEMATOCRIT: 26.9 % — AB (ref 36.0–46.0)
Hemoglobin: 8.7 g/dL — ABNORMAL LOW (ref 12.0–15.0)
Immature Granulocytes: 0 %
Lymphocytes Relative: 3 %
Lymphs Abs: 0.4 10*3/uL — ABNORMAL LOW (ref 0.7–4.0)
MCH: 30.4 pg (ref 26.0–34.0)
MCHC: 32.3 g/dL (ref 30.0–36.0)
MCV: 94.1 fL (ref 80.0–100.0)
Monocytes Absolute: 0.3 10*3/uL (ref 0.1–1.0)
Monocytes Relative: 2 %
NRBC: 0 % (ref 0.0–0.2)
Neutro Abs: 15.2 10*3/uL — ABNORMAL HIGH (ref 1.7–7.7)
Neutrophils Relative %: 95 %
Platelets: 338 10*3/uL (ref 150–400)
RBC: 2.86 MIL/uL — AB (ref 3.87–5.11)
RDW: 13.4 % (ref 11.5–15.5)
WBC: 16 10*3/uL — AB (ref 4.0–10.5)

## 2018-03-21 LAB — TROPONIN I: Troponin I: <0.03 ng/mL (ref <0.03)

## 2018-03-21 MED ORDER — PREDNISONE 20 MG PO TABS
60.0000 mg | ORAL_TABLET | Freq: Every day | ORAL | Status: AC
  Administered 2018-03-21 – 2018-03-23 (×3): 60 mg via ORAL
  Filled 2018-03-21 (×3): qty 3

## 2018-03-21 MED ORDER — MAGNESIUM SULFATE 4 GM/100ML IV SOLN
4.0000 g | Freq: Once | INTRAVENOUS | Status: AC
  Administered 2018-03-21: 4 g via INTRAVENOUS
  Filled 2018-03-21: qty 100

## 2018-03-21 MED ORDER — ASPIRIN EC 81 MG PO TBEC
81.0000 mg | DELAYED_RELEASE_TABLET | Freq: Every day | ORAL | Status: DC
  Administered 2018-03-21 – 2018-03-24 (×4): 81 mg via ORAL
  Filled 2018-03-21 (×4): qty 1

## 2018-03-21 MED ORDER — DILTIAZEM HCL ER COATED BEADS 120 MG PO CP24
120.0000 mg | ORAL_CAPSULE | Freq: Every day | ORAL | Status: DC
  Administered 2018-03-21 – 2018-03-28 (×8): 120 mg via ORAL
  Filled 2018-03-21 (×8): qty 1

## 2018-03-21 MED ORDER — FUROSEMIDE 10 MG/ML IJ SOLN
20.0000 mg | Freq: Two times a day (BID) | INTRAMUSCULAR | Status: DC
  Administered 2018-03-21 – 2018-03-23 (×4): 20 mg via INTRAVENOUS
  Filled 2018-03-21 (×4): qty 2

## 2018-03-21 NOTE — Progress Notes (Signed)
PROGRESS NOTE    Sonya [name]  GUR:[id number] DOB: [birth year] DOA: 03/17/2018 PCP: Default, Provider, MD   Brief Narrative:  Sonya [name] is a 62 y.o. female with medical history significant of right lung mass presenting to the hospital for evaluation of shortness of breath. 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  Patient was offered admission for further work-up at that time but she declined it.  Plan was for her to follow-up with her primary care provider. Patient states she did not follow-up with a doctor since her ED discharge in July.  She continued to smoke cigarettes and quit a week ago; history of smoking 1 pack/day for 37 years.  States that she has been feeling short of breath with exertion and when lying down for the past 2 weeks.  Also having a cough productive of white-colored sputum.  Symptoms have been getting progressively worse.  Denies having any hemoptysis.  She has unintentionally lost 5 to 6 pounds since July.  Denies having any chest pain.  Denies having any hematemesis, melena, or hematochezia.  ED Course: Afebrile.  Tachycardic initially but now pulse normal.  Continues to be tachypneic.  Not hypotensive.  Satting well on room air.  No leukocytosis.  Corrected calcium 12.3.  I-STAT troponin negative.  EKG not suggestive of ACS.  Chest x-ray showing known right suprahilar mass with associated upper lobe atelectasis.  CTA chest showing no pulmonary embolus, however, increased vascular invasion and/or mass-effect by the right suprahilar mass, resulting in new occlusion of the right middle lobe pulmonary artery and persistent right upper lobe pulmonary artery occlusion as well as occlusion/invasion of the superior vena cava.  Debris within the right mainstem bronchus.  Right upper lobe atelectasis.  CT  abdomen pelvis showing no acute abnormality.  ED physician discussed the case with Dr. Jana Hakim from oncology who recommended starting the patient on IV heparin and consulting IR in the morning for biopsy. TRH paged to admit.   Oncology was consulted and awaiting evaluation.  IR was consulted CT biopsy was attempted however tissue was not able to be obtained as serous fluid was what was collected.  Pulmonary was curb sided however felt patient was not a candidate at this time for bronchoscopy due to findings on CT scan and concern of puncturing pulmonary artery bronchoscopy was undertaken. Patient subsequently noted to be in acute COPD exacerbation placed on steroids, nebulizer treatments, antibiotics. On 03/20/2018 patient noted to go into new onset atrial fibrillation.    Assessment & Plan:   Principal Problem:   Pulmonary mass Active Problems:   Chronic anemia   Hypercalcemia   Hypertensive urgency   HTN (hypertension)   Lung mass   Occlusion of pulmonary artery (HCC)   COPD with acute exacerbation (HCC)   Tachycardia   Atrial fibrillation with RVR (HCC)   New onset atrial fibrillation (HCC)  #1 pulmonary mass occluding/invading right pulmonary artery and SVC/lung mass with SVC Patient presented to the ED with 2-week history of worsening dyspnea and cough in addition to on intentional weight loss.  Patient seen in the ED December 04, 2017 CT chest at that time revealed large right upper lobe/suprahilar mass involving right upper lobe bronchus, right upper lobe pulmonary artery and SVC.  Patient refused admission at that time and was subsequently lost to follow-up.  Patient has  not seen a PCP in over 1 to 2 years.  Patient with ongoing tobacco abuse however states quit 1 week ago.  CT angiogram chest done showed increasing vascular invasion and no mass-effect of the right suprahilar mass, resulting in new occlusion of right middle lobe pulmonary artery and persistent right upper lobe pulmonary  artery occlusion as well as occlusion/invasion of the SVC.  Debris noted in the right mainstem bronchus.  Right upper lobe atelectasis.  Case was discussed with pulmonary who looked at the CT findings and did not feel a bronchoscopy at this time will be of any benefit and increased risk, due to external compression from mass.  IR has been consulted to evaluate for possible biopsy.  IR  Was concerned that may not get a good sample as may likely have some necrosis however CT-guided biopsy was done on 03/18/2018, which showed a large cystic area with frank serous fluid returned and no tissue obtained.  IR recommended PET/CT to determine best target versus bronchoscopy as an alternative next step.  Discussed again with pulmonary who feel concerned that bronchoscopy may lead to incidental laceration of pulmonary artery and feel currently risks right now bronchoscopy are high and recommending alternative of PET/CT as recommended per interventional radiology.  Oncology consultation pending.  Continue Mucinex.  Transition from IV Solu-Medrol to oral prednisone.  Continue scheduled nebs, Pulmicort.  Outpatient follow-up with oncology.   2.  New onset atrial fibrillation CHA2DSVASC = 2 Patient noted to have heart rates sustained in the 150s the morning of 03/20/2018.  Patient noted on exam to have a cardiac exam which was irregularly irregular.  New onset A. fib likely secondary to lung mass.   Patient converted back to normal sinus rhythm afternoon of 03/20/2018.  Cardiac enzymes negative x2.  Magnesium of 1.7 and will replete.  TSH within normal limits at 1.354.  2D echo with normal left ventricular size with mild LVH.  EF 60 to 65%.  No significant valvular abnormalities.  Patient was started on a Cardizem drip and as patient converted to normal sinus rhythm patient was transitioned to oral Cardizem 30 mg every 6 hours per cardiology.  Patient seen in consultation by cardiology and Cardizem has been changed to Cardizem  CD 120 mg daily.  Per cardiology as patient has a need for possible repeat biopsy will hold off on anticoagulation at this time.  If patient is to have a recurrence of A. fib may consider Eliquis.  Will place patient on a baby aspirin 81 mg daily for now.  Outpatient follow-up with cardiology.   3.  Probable COPD exacerbation Patient with poor air movement.  Shortness of breath on minimal exertion, expiratory wheezing noted on examination on 03/19/2018.  Clinical improvement after patient being started on Pulmicort, scheduled duo nebs, IV Solu-Medrol, Levaquin.  Continue Mucinex.  Continue Tussionex as needed.  Discontinue IV Solu-Medrol and transition to oral prednisone.  Follow.   4.  Chronic anemia Patient denies any overt bleeding.  No hemoptysis.  No hematemesis.  No melena.  No hematochezia.  Anemia panel with iron level of 14, TIBC decreased at 213.  Ferritin of 103.  Hemoglobin currently at 8.7 from 8.9.  Follow H&H.  Transfusion threshold hemoglobin less than 7.  Oncology evaluation pending.  5.  Hypertensive urgency Patient noted to have systolic blood pressures in the 190s and 180s early on during the hospitalization.  Blood pressure improved on Norvasc and lisinopril. Is noted on review of the chart that patient  has had a history of hypertension was supposed to be on antihypertensive medications.  Patient states has not been on antihypertensive medications in over a year and has not seen a PCP since then.  Patient on IV Lasix now and as such lisinopril was discontinued.  Patient started on Cardizem due to new onset A. fib.  Hydralazine as needed.  Will need outpatient follow-up.   6.  Hypercalcemia Corrected calcium was 12.3 on admission.  Concern for underlying malignancy.  Patient asymptomatic.  Phosphorus level 2.2.  Magnesium was 1.7.  Ionized calcium 6.1.  Vitamin D 25 hydroxy was 9.8.  PTH was low at 8.  Continue hydration with IV fluids.  Calcium levels trending down.  Corrected  calcium level at 11.2.  Continue IV fluids.  Increase Lasix to 20 mg IV every 12 hours.  Follow urine output.  Status post IV Zometa.  Continue calcitonin nasal spray.  Outpatient follow-up with oncology.      DVT prophylaxis: Lovenox Code Status: Full Family Communication: Updated patient.  No family at bedside. Disposition Plan: Pending work-up.  Likely home once medically stable.  Patient was seen in July and was lost to follow-up.   Consultants:   Interventional radiology  Oncology pending  Cardiology: Dr. Angelena Form 03/20/2018  Procedures:   CT angiogram chest 03/17/2018  CT abdomen and pelvis 03/18/2018  Chest x-ray 03/17/2018  CT-guided biopsy right upper lobe mass--- large cystic area with frank serous fluid returned, no tissue obtained.  Per Dr. Earleen Newport IR 03/18/2018  Antimicrobials:   Levaquin 03/19/2018   Subjective: Sitting up in bed.  States some improvement with shortness of breath.  Coughing improved.  No chest pain.  No hemoptysis.  Patient converted back to sinus rhythm yesterday afternoon.    Objective: Vitals:   03/20/18 1944 03/20/18 2051 03/21/18 0542 03/21/18 0859  BP:  (!) 142/85 (!) 167/85   Pulse:  83 73 85  Resp:  20 18 18   Temp:  (!) 97.5 F (36.4 C) 98.2 F (36.8 C)   TempSrc:  Oral Oral   SpO2: 98% 100% 100% 100%  Weight:      Height:        Intake/Output Summary (Last 24 hours) at 03/21/2018 1116 Last data filed at 03/21/2018 0639 Gross per 24 hour  Intake 1615 ml  Output 300 ml  Net 1315 ml   Filed Weights   03/17/18 1612 03/17/18 2131  Weight: 56.7 kg 47.2 kg    Examination:  General exam: NAD. Respiratory system: Fair air movement.  Minimal expiratory wheezing.  Less tight.  No use of accessory muscles of respiration.  Speaking in full sentences.  Cardiovascular system: Regular rate and rhythm no murmurs rubs or gallops.  No JVD.  No lower extremity edema.   Gastrointestinal system: Abdomen is soft, nontender,  nondistended, positive bowel sounds.  No rebound.  No guarding.  Central nervous system: Alert and oriented. No focal neurological deficits. Extremities: Symmetric 5 x 5 power. Skin: No rashes, lesions or ulcers Psychiatry: Judgement and insight appear normal. Mood & affect appropriate.     Data Reviewed: I have personally reviewed following labs and imaging studies  CBC: Recent Labs  Lab 03/17/18 1700 03/18/18 0256 03/19/18 0533 03/20/18 0552 03/21/18 0437  WBC 7.4 9.0 12.3* 13.7* 16.0*  NEUTROABS 5.8  --   --  12.8* 15.2*  HGB 9.0* 8.6* 8.5* 8.9* 8.7*  HCT 28.0* 27.4* 26.9* 27.9* 26.9*  MCV 94.6 95.5 95.4 94.3 94.1  PLT 306 310 306  292 124   Basic Metabolic Panel: Recent Labs  Lab 03/17/18 1700 03/18/18 0826 03/18/18 1734 03/19/18 0533 03/20/18 0552 03/21/18 0437  NA 134* 132*  --  134* 131* 135  K 3.8 3.5  --  4.3 4.3 4.1  CL 100 101  --  101 100 102  CO2 26 24  --  25 25 25   GLUCOSE 107* 101*  --  105* 142* 198*  BUN 9 8  --  7* 10 17  CREATININE 0.56 0.64  --  0.61 0.55 0.56  CALCIUM 11.4* 10.7*  10.9*  --  10.9* 11.1* 10.0  MG  --  1.7  --  2.1  --  1.7  PHOS  --   --  2.2*  --  3.3 2.0*   GFR: Estimated Creatinine Clearance: 54.3 mL/min (by C-G formula based on SCr of 0.56 mg/dL). Liver Function Tests: Recent Labs  Lab 03/17/18 1700 03/18/18 0826 03/19/18 0533 03/21/18 0437  AST 12* 11* 12* 18  ALT 8 8 8 11   ALKPHOS 69 64 61 60  BILITOT 0.6 0.5 0.8 0.2*  PROT 7.8 7.7 7.5 7.3  ALBUMIN 2.9* 3.0* 3.0* 2.5*   No results for input(s): LIPASE, AMYLASE in the last 168 hours. No results for input(s): AMMONIA in the last 168 hours. Coagulation Profile: Recent Labs  Lab 03/17/18 2006  INR 1.16   Cardiac Enzymes: Recent Labs  Lab 03/20/18 1206 03/20/18 2323  TROPONINI <0.03 <0.03   BNP (last 3 results) No results for input(s): PROBNP in the last 8760 hours. HbA1C: No results for input(s): HGBA1C in the last 72 hours. CBG: No results for  input(s): GLUCAP in the last 168 hours. Lipid Profile: No results for input(s): CHOL, HDL, LDLCALC, TRIG, CHOLHDL, LDLDIRECT in the last 72 hours. Thyroid Function Tests: Recent Labs    03/20/18 1206  TSH 1.354   Anemia Panel: No results for input(s): VITAMINB12, FOLATE, FERRITIN, TIBC, IRON, RETICCTPCT in the last 72 hours. Sepsis Labs: No results for input(s): PROCALCITON, LATICACIDVEN in the last 168 hours.  No results found for this or any previous visit (from the past 240 hour(s)).       Radiology Studies: No results found.      Scheduled Meds: . aspirin EC  81 mg Oral Daily  . budesonide (PULMICORT) nebulizer solution  0.5 mg Nebulization BID  . calcitonin (salmon)  1 spray Alternating Nares Daily  . dextromethorphan-guaiFENesin  1 tablet Oral BID  . diltiazem  120 mg Oral Daily  . enoxaparin (LOVENOX) injection  40 mg Subcutaneous Q24H  . feeding supplement (ENSURE ENLIVE)  237 mL Oral BID BM  . furosemide  20 mg Intravenous Daily  . ipratropium  0.5 mg Nebulization TID  . levalbuterol  0.63 mg Nebulization TID  . levofloxacin  500 mg Oral Daily  . multivitamin with minerals  1 tablet Oral Daily  . predniSONE  60 mg Oral QAC breakfast   Continuous Infusions: . sodium chloride 125 mL/hr at 03/21/18 1045  . magnesium sulfate 1 - 4 g bolus IVPB 4 g (03/21/18 1030)     LOS: 3 days    Time spent: 40 minutes    Irine Seal, MD [facility] Pager [phone]  If 7PM-7AM, please contact night-coverage [email or web address] Password [email or web address] 03/21/2018, 11:16 AM

## 2018-03-21 NOTE — Progress Notes (Signed)
DAILY PROGRESS NOTE   Patient Name: Sonya [name] Date of Encounter: 03/21/2018  Chief Complaint   No complaints  Patient Profile   Sonya [name] is a 62 y.o. female with a history of lung mass, tobacco abuse, COPD and HTN who is being seen today for the evaluation of new onset atrial fibrillation with RVR at the request of Dr. Grandville Silos.  Subjective   Maintaining sinus rhythm overnight with PAC's and PVC's. Echo yesterday shows LVEF 60-65%, mild LVH and grade 1 DD.  Objective   Vitals:   03/20/18 1936 03/20/18 1944 03/20/18 2051 03/21/18 0542  BP:   (!) 142/85 (!) 167/85  Pulse:   83 73  Resp:   20 18  Temp:   (!) 97.5 F (36.4 C) 98.2 F (36.8 C)  TempSrc:   Oral Oral  SpO2: 95% 98% 100% 100%  Weight:      Height:        Intake/Output Summary (Last 24 hours) at 03/21/2018 0817 Last data filed at 03/21/2018 2202 Gross per 24 hour  Intake 1615 ml  Output 300 ml  Net 1315 ml   Filed Weights   03/17/18 1612 03/17/18 2131  Weight: 56.7 kg 47.2 kg    Physical Exam   General appearance: alert and no distress Lungs: clear to auscultation bilaterally Heart: regular rate and rhythm, S1, S2 normal, no murmur, click, rub or gallop Extremities: extremities normal, atraumatic, no cyanosis or edema Neurologic: Grossly normal  Inpatient Medications    Scheduled Meds: . aspirin EC  81 mg Oral Daily  . budesonide (PULMICORT) nebulizer solution  0.5 mg Nebulization BID  . calcitonin (salmon)  1 spray Alternating Nares Daily  . dextromethorphan-guaiFENesin  1 tablet Oral BID  . diltiazem  30 mg Oral Q6H  . enoxaparin (LOVENOX) injection  40 mg Subcutaneous Q24H  . feeding supplement (ENSURE ENLIVE)  237 mL Oral BID BM  . furosemide  20 mg Intravenous Daily  . ipratropium  0.5 mg Nebulization TID  . levalbuterol  0.63 mg Nebulization TID  . levofloxacin  500 mg Oral Daily  . multivitamin with minerals  1 tablet Oral Daily  . predniSONE  60 mg Oral QAC  breakfast    Continuous Infusions: . sodium chloride 125 mL/hr at 03/21/18 0157  . magnesium sulfate 1 - 4 g bolus IVPB      PRN Meds: chlorpheniramine-HYDROcodone, hydrALAZINE, ipratropium, levalbuterol   Labs   Results for orders placed or performed during the hospital encounter of 03/17/18 (from the past 48 hour(s))  Phosphorus     Status: None   Collection Time: 03/20/18  5:52 AM  Result Value Ref Range   Phosphorus 3.3 2.5 - 4.6 mg/dL    Comment: Performed at [facility], [place] [address], [place], [place] [address]  CBC with Differential/Platelet     Status: Abnormal   Collection Time: 03/20/18  5:52 AM  Result Value Ref Range   WBC 13.7 (H) 4.0 - 10.5 K/uL    Comment: WHITE COUNT CONFIRMED ON SMEAR   RBC 2.96 (L) 3.87 - 5.11 MIL/uL   Hemoglobin 8.9 (L) 12.0 - 15.0 g/dL   HCT 27.9 (L) 36.0 - 46.0 %   MCV 94.3 80.0 - 100.0 fL   MCH 30.1 26.0 - 34.0 pg   MCHC 31.9 30.0 - 36.0 g/dL   RDW 13.3 11.5 - 15.5 %   Platelets 292 150 - 400 K/uL   nRBC 0.0 0.0 - 0.2 %   Neutrophils  Relative % 93 %   Neutro Abs 12.8 (H) 1.7 - 7.7 K/uL   Lymphocytes Relative 4 %   Lymphs Abs 0.5 (L) 0.7 - 4.0 K/uL   Monocytes Relative 2 %   Monocytes Absolute 0.3 0.1 - 1.0 K/uL   Eosinophils Relative 0 %   Eosinophils Absolute 0.0 0.0 - 0.5 K/uL   Basophils Relative 0 %   Basophils Absolute 0.0 0.0 - 0.1 K/uL   Immature Granulocytes 1 %   Abs Immature Granulocytes 0.07 0.00 - 0.07 K/uL    Comment: Performed at [facility] [place] [address] [place], [place] [address]  Basic metabolic panel     Status: Abnormal   Collection Time: 03/20/18  5:52 AM  Result Value Ref Range   Sodium 131 (L) 135 - 145 mmol/L   Potassium 4.3 3.5 - 5.1 mmol/L   Chloride 100 98 - 111 mmol/L   CO2 25 22 - 32 mmol/L   Glucose, Bld 142 (H) 70 - 99 mg/dL   BUN 10 8 - 23 mg/dL   Creatinine, Ser 0.55 0.44 - 1.00 mg/dL   Calcium 11.1 (H) 8.9 - 10.3 mg/dL   GFR calc non Af Amer >60  >60 mL/min   GFR calc Af Amer >60 >60 mL/min    Comment: (NOTE) The eGFR has been calculated using the CKD EPI equation. This calculation has not been validated in all clinical situations. eGFR's persistently <60 mL/min signify possible Chronic Kidney Disease.    Anion gap 6 5 - 15    Comment: Performed at [facility], [place] [address], [place], [place] [address]  Troponin I (q 6hr x 3)     Status: None   Collection Time: 03/20/18 12:06 PM  Result Value Ref Range   Troponin I <0.03 <0.03 ng/mL    Comment: Performed at [facility], [place] [address], [place], [place] [address]  TSH     Status: None   Collection Time: 03/20/18 12:06 PM  Result Value Ref Range   TSH 1.354 0.350 - 4.500 uIU/mL    Comment: Performed by a 3rd Generation assay with a functional sensitivity of <=0.01 uIU/mL. Performed at [facility], [place] [address], [place], [place] [address]   Troponin I (q 6hr x 3)     Status: None   Collection Time: 03/20/18 11:23 PM  Result Value Ref Range   Troponin I <0.03 <0.03 ng/mL    Comment: Performed at [facility], [place] [address], [place], [place] [address]  Comprehensive metabolic panel     Status: Abnormal   Collection Time: 03/21/18  4:37 AM  Result Value Ref Range   Sodium 135 135 - 145 mmol/L   Potassium 4.1 3.5 - 5.1 mmol/L   Chloride 102 98 - 111 mmol/L   CO2 25 22 - 32 mmol/L   Glucose, Bld 198 (H) 70 - 99 mg/dL   BUN 17 8 - 23 mg/dL   Creatinine, Ser 0.56 0.44 - 1.00 mg/dL   Calcium 10.0 8.9 - 10.3 mg/dL   Total Protein 7.3 6.5 - 8.1 g/dL   Albumin 2.5 (L) 3.5 - 5.0 g/dL   AST 18 15 - 41 U/L   ALT 11 0 - 44 U/L   Alkaline Phosphatase 60 38 - 126 U/L   Total Bilirubin 0.2 (L) 0.3 - 1.2 mg/dL   GFR calc non Af Amer >60 >60 mL/min   GFR calc Af Amer >60 >60 mL/min  Comment: (NOTE) The eGFR has been calculated using the CKD EPI equation. This calculation has not been validated in all  clinical situations. eGFR's persistently <60 mL/min signify possible Chronic Kidney Disease.    Anion gap 8 5 - 15    Comment: Performed at [facility], [place] [address], [place], [place] [address]  CBC with Differential/Platelet     Status: Abnormal   Collection Time: 03/21/18  4:37 AM  Result Value Ref Range   WBC 16.0 (H) 4.0 - 10.5 K/uL   RBC 2.86 (L) 3.87 - 5.11 MIL/uL   Hemoglobin 8.7 (L) 12.0 - 15.0 g/dL   HCT 26.9 (L) 36.0 - 46.0 %   MCV 94.1 80.0 - 100.0 fL   MCH 30.4 26.0 - 34.0 pg   MCHC 32.3 30.0 - 36.0 g/dL   RDW 13.4 11.5 - 15.5 %   Platelets 338 150 - 400 K/uL   nRBC 0.0 0.0 - 0.2 %   Neutrophils Relative % 95 %   Neutro Abs 15.2 (H) 1.7 - 7.7 K/uL   Lymphocytes Relative 3 %   Lymphs Abs 0.4 (L) 0.7 - 4.0 K/uL   Monocytes Relative 2 %   Monocytes Absolute 0.3 0.1 - 1.0 K/uL   Eosinophils Relative 0 %   Eosinophils Absolute 0.0 0.0 - 0.5 K/uL   Basophils Relative 0 %   Basophils Absolute 0.0 0.0 - 0.1 K/uL   Immature Granulocytes 0 %   Abs Immature Granulocytes 0.06 0.00 - 0.07 K/uL    Comment: Performed at [facility], [place] [address], [place], [place] [address]  Magnesium     Status: None   Collection Time: 03/21/18  4:37 AM  Result Value Ref Range   Magnesium 1.7 1.7 - 2.4 mg/dL    Comment: Performed at [facility], [place] [address] [place], [place] [address]  Phosphorus     Status: Abnormal   Collection Time: 03/21/18  4:37 AM  Result Value Ref Range   Phosphorus 2.0 (L) 2.5 - 4.6 mg/dL    Comment: Performed at [facility], [place] [address], [place], [place] [address]    ECG   N/A  Telemetry   Sinus rhythm with PAC's/PVC's - Personally Reviewed  Radiology    No results found.  Cardiac Studies   LV EF: 60% -   65%  ------------------------------------------------------------------- Indications:      Atrial fibrillation -  427.31.  ------------------------------------------------------------------- Study Conclusions  - Left ventricle: The cavity size was normal. Wall thickness was   increased in a pattern of mild LVH. Systolic function was normal.   The estimated ejection fraction was in the range of 60% to 65%.   Wall motion was normal; there were no regional wall motion   abnormalities. Doppler parameters are consistent with abnormal   left ventricular relaxation (grade 1 diastolic dysfunction). - Aortic valve: Probably trileaflet; mildly calcified leaflets.   There was no stenosis. - Aorta: Mildly dilated aortic root and ascending aorta. Aortic   root dimension: 40 mm (ED) Ascending aortic diameter: 38 mm (S). - Mitral valve: There was trivial regurgitation. - Left atrium: The atrium was mildly dilated. - Right ventricle: The cavity size was normal. Systolic function   was normal. - Tricuspid valve: Peak RV-RA gradient (S): 28 mm Hg. - Pulmonary arteries: PA peak pressure: 31 mm Hg (S). - Inferior vena cava: The vessel was normal in size. The   respirophasic diameter changes were in the normal range (>=  50%),   consistent with normal central venous pressure. - Pericardium, extracardiac: Pleural effusion noted. A trivial   pericardial effusion was identified posterior to the heart.  Impressions:  - Normal LV size with mild LV hypertrophy. EF 60-65%. Normal RV   size and systolic function. No significant valvular   abnormalities. Pleural effusion noted.  Assessment   1. Principal Problem: 2.   Pulmonary mass 3. Active Problems: 4.   Chronic anemia 5.   Hypercalcemia 6.   Hypertensive urgency 7.   HTN (hypertension) 8.   Lung mass 9.   Occlusion of pulmonary artery ([place]) 10.   COPD with acute exacerbation ([place]) 11.   Tachycardia 12.   Atrial fibrillation with RVR ([place]) 13.   New onset atrial fibrillation ([place]) 14.   Plan   1. Remains in sinus overnight- will switch to LA cardizem  today. Agree with holding off on anticoagulation for now, given need for possible repeat biopsy since the recent biopsy did not reveal a successful sample. Given CHADVASC score of 2 - if there is recurrent afib, would anticoagulate with Eliquis,  [facility] will sign off.   Medication Recommendations:  cardizem LA 120 mg daily Other recommendations (labs, testing, etc):  Monitor for afib recurrence, consider Eliquis Follow up as an outpatient:  Dr. Angelena Form   Time Spent Directly with Patient:  I have spent a total of 15 minutes with the patient reviewing hospital notes, telemetry, EKGs, labs and examining the patient as well as establishing an assessment and plan that was discussed personally with the patient.  > 50% of time was spent in direct patient care.  Length of Stay:  LOS: 3 days   Pixie Casino, MD, [facility], [place] Director of the Advanced Lipid Disorders &  Cardiovascular Risk Reduction Clinic Diplomate of the American Board of Clinical Lipidology Attending Cardiologist  Direct Dial: [phone]  Fax: [phone]  Website:  [email or web address] 03/21/2018, 8:17 AM

## 2018-03-21 NOTE — Progress Notes (Signed)
L. arm edema . Not new Per patient. Denies any pain or discomfort. Elevated on a pillow.

## 2018-03-21 NOTE — Progress Notes (Signed)
Occupational Therapy Treatment Patient Details Name: Ashyia Schraeder MRN: [id number] DOB: 1955-10-28 Today's Date: 03/21/2018    History of present illness  62 y.o. female with medical history significant of right lung mass presenting to the hospital for evaluation of shortness of breath. 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   OT comments  Pt verbalizes energy conservation and tub transfer:  She can borrow DME. Too fatiqued to practic  Follow Up Recommendations  Supervision/Assistance - 24 hour    Equipment Recommendations  None recommended by OT(can borrow tub seat)    Recommendations for Other Services      Precautions / Restrictions Precautions Precautions: Fall Restrictions Weight Bearing Restrictions: No       Mobility Bed Mobility                  Transfers                      Balance                                           ADL either performed or assessed with clinical judgement   ADL                                         General ADL Comments: brought tub bench and tub seat up to show pt. She states that her niece has a brand new seat which she can use.  Reinforced energy conservation and positioned L arm up on pillows for forearm edema. Pt performed AROM exercises     Vision       Perception     Praxis      Cognition Arousal/Alertness: Awake/alert Behavior During Therapy: WFL for tasks assessed/performed Overall Cognitive Status: Within Functional Limits for tasks assessed                                          Exercises     Shoulder Instructions       General Comments      Pertinent Vitals/ Pain       Pain Assessment: No/denies pain  Home Living                                           Prior Functioning/Environment              Frequency           Progress Toward Goals  OT Goals(current goals can now be found in the care plan section)  Progress towards OT goals: Progressing toward goals     Plan      Co-evaluation                 AM-PAC PT "6 Clicks" Daily Activity     Outcome Measure   Help from another person eating meals?: None Help from another person taking care of personal grooming?:  A Little Help from another person toileting, which includes using toliet, bedpan, or urinal?: A Little Help from another person bathing (including washing, rinsing, drying)?: A Little Help from another person to put on and taking off regular upper body clothing?: A Little Help from another person to put on and taking off regular lower body clothing?: A Little 6 Click Score: 19    End of Session        Activity Tolerance Patient limited by fatigue   Patient Left in bed;with call bell/phone within reach;with family/visitor present   Nurse Communication          Time: 6256-3893 OT Time Calculation (min): 15 min  Charges: OT General Charges $OT Visit: 1 Visit OT Treatments $Therapeutic Activity: 8-22 mins  Lesle Chris, OTR/L Acute Rehabilitation Services [phone] [facility] pager [phone] office 03/21/2018   Kerensa Nicklas 03/21/2018, 2:59 PM

## 2018-03-22 DIAGNOSIS — M7989 Other specified soft tissue disorders: Secondary | ICD-10-CM

## 2018-03-22 LAB — BASIC METABOLIC PANEL
Anion gap: 8 (ref 5–15)
BUN: 15 mg/dL (ref 8–23)
CHLORIDE: 99 mmol/L (ref 98–111)
CO2: 28 mmol/L (ref 22–32)
Calcium: 8.8 mg/dL — ABNORMAL LOW (ref 8.9–10.3)
Creatinine, Ser: 0.43 mg/dL — ABNORMAL LOW (ref 0.44–1.00)
GFR calc Af Amer: >60 mL/min (ref >60)
GFR calc non Af Amer: >60 mL/min (ref >60)
Glucose, Bld: 112 mg/dL — ABNORMAL HIGH (ref 70–99)
POTASSIUM: 3.2 mmol/L — AB (ref 3.5–5.1)
SODIUM: 135 mmol/L (ref 135–145)

## 2018-03-22 LAB — CBC WITH DIFFERENTIAL/PLATELET
ABS IMMATURE GRANULOCYTES: 0.05 10*3/uL (ref 0.00–0.07)
BASOS ABS: 0 10*3/uL (ref 0.0–0.1)
Basophils Relative: 0 %
Eosinophils Absolute: 0 10*3/uL (ref 0.0–0.5)
Eosinophils Relative: 0 %
HCT: 29.2 % — ABNORMAL LOW (ref 36.0–46.0)
HEMOGLOBIN: 9.5 g/dL — AB (ref 12.0–15.0)
Immature Granulocytes: 0 %
LYMPHS ABS: 0.6 10*3/uL — AB (ref 0.7–4.0)
Lymphocytes Relative: 5 %
MCH: 30.3 pg (ref 26.0–34.0)
MCHC: 32.5 g/dL (ref 30.0–36.0)
MCV: 93 fL (ref 80.0–100.0)
MONO ABS: 1.1 10*3/uL — AB (ref 0.1–1.0)
Monocytes Relative: 9 %
NEUTROS ABS: 11 10*3/uL — AB (ref 1.7–7.7)
NRBC: 0 % (ref 0.0–0.2)
Neutrophils Relative %: 86 %
Platelets: 384 10*3/uL (ref 150–400)
RBC: 3.14 MIL/uL — AB (ref 3.87–5.11)
RDW: 13.6 % (ref 11.5–15.5)
WBC: 12.8 10*3/uL — AB (ref 4.0–10.5)

## 2018-03-22 LAB — MAGNESIUM: Magnesium: 2.5 mg/dL — ABNORMAL HIGH (ref 1.7–2.4)

## 2018-03-22 MED ORDER — FUROSEMIDE 10 MG/ML IJ SOLN
40.0000 mg | Freq: Once | INTRAMUSCULAR | Status: AC
  Administered 2018-03-22: 40 mg via INTRAVENOUS
  Filled 2018-03-22: qty 4

## 2018-03-22 MED ORDER — LISINOPRIL 10 MG PO TABS
10.0000 mg | ORAL_TABLET | Freq: Every day | ORAL | Status: DC
  Administered 2018-03-22 – 2018-03-23 (×2): 10 mg via ORAL
  Filled 2018-03-22 (×2): qty 1

## 2018-03-22 MED ORDER — POTASSIUM CHLORIDE CRYS ER 20 MEQ PO TBCR
40.0000 meq | EXTENDED_RELEASE_TABLET | ORAL | Status: AC
  Administered 2018-03-22 (×2): 40 meq via ORAL
  Filled 2018-03-22 (×2): qty 2

## 2018-03-22 NOTE — Progress Notes (Signed)
PROGRESS NOTE    Sonya [name]  NID:[id number] DOB: Dec 14, 1955 DOA: 03/17/2018 PCP: Default, Provider, MD   Brief Narrative:  Sonya [name] is a 62 y.o. female with medical history significant of right lung mass presenting to the hospital for evaluation of shortness of breath. 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  Patient was offered admission for further work-up at that time but she declined it.  Plan was for her to follow-up with her primary care provider. Patient states she did not follow-up with a doctor since her ED discharge in July.  She continued to smoke cigarettes and quit a week ago; history of smoking 1 pack/day for 37 years.  States that she has been feeling short of breath with exertion and when lying down for the past 2 weeks.  Also having a cough productive of white-colored sputum.  Symptoms have been getting progressively worse.  Denies having any hemoptysis.  She has unintentionally lost 5 to 6 pounds since July.  Denies having any chest pain.  Denies having any hematemesis, melena, or hematochezia.  ED Course: Afebrile.  Tachycardic initially but now pulse normal.  Continues to be tachypneic.  Not hypotensive.  Satting well on room air.  No leukocytosis.  Corrected calcium 12.3.  I-STAT troponin negative.  EKG not suggestive of ACS.  Chest x-ray showing known right suprahilar mass with associated upper lobe atelectasis.  CTA chest showing no pulmonary embolus, however, increased vascular invasion and/or mass-effect by the right suprahilar mass, resulting in new occlusion of the right middle lobe pulmonary artery and persistent right upper lobe pulmonary artery occlusion as well as occlusion/invasion of the superior vena cava.  Debris within the right mainstem bronchus.  Right upper lobe atelectasis.  CT  abdomen pelvis showing no acute abnormality.  ED physician discussed the case with Dr. Jana Hakim from oncology who recommended starting the patient on IV heparin and consulting IR in the morning for biopsy. TRH paged to admit.   Oncology was consulted and awaiting evaluation.  IR was consulted CT biopsy was attempted however tissue was not able to be obtained as serous fluid was what was collected.  Pulmonary was curb sided however felt patient was not a candidate at this time for bronchoscopy due to findings on CT scan and concern of puncturing pulmonary artery bronchoscopy was undertaken. Patient subsequently noted to be in acute COPD exacerbation placed on steroids, nebulizer treatments, antibiotics. On 03/20/2018 patient noted to go into new onset atrial fibrillation.    Assessment & Plan:   Principal Problem:   Pulmonary mass Active Problems:   Chronic anemia   Hypercalcemia   Hypertensive urgency   HTN (hypertension)   Lung mass   Occlusion of pulmonary artery (HCC)   COPD with acute exacerbation (HCC)   Tachycardia   Atrial fibrillation with RVR (HCC)   New onset atrial fibrillation (HCC)  #1 pulmonary mass occluding/invading right pulmonary artery and SVC/lung mass with SVC Patient presented to the ED with 2-week history of worsening dyspnea and cough in addition to on intentional weight loss.  Patient seen in the ED December 04, 2017 CT chest at that time revealed large right upper lobe/suprahilar mass involving right upper lobe bronchus, right upper lobe pulmonary artery and SVC.  Patient refused admission at that time and was subsequently lost to follow-up.  Patient has  not seen a PCP in over 1 to 2 years.  Patient with ongoing tobacco abuse however states quit 1 week ago.  CT angiogram chest done showed increasing vascular invasion and no mass-effect of the right suprahilar mass, resulting in new occlusion of right middle lobe pulmonary artery and persistent right upper lobe pulmonary  artery occlusion as well as occlusion/invasion of the SVC.  Debris noted in the right mainstem bronchus.  Right upper lobe atelectasis.  Case was discussed with pulmonary who looked at the CT findings and did not feel a bronchoscopy at this time will be of any benefit and increased risk, due to external compression from mass.  IR has been consulted to evaluate for possible biopsy.  IR  Was concerned that may not get a good sample as may likely have some necrosis however CT-guided biopsy was done on 03/18/2018, which showed a large cystic area with frank serous fluid returned and no tissue obtained.  IR recommended PET/CT to determine best target versus bronchoscopy as an alternative next step.  Discussed again with pulmonary who feel concerned that bronchoscopy may lead to incidental laceration of pulmonary artery and feel currently risks right now bronchoscopy are high and recommending alternative of PET/CT as recommended per interventional radiology.  Oncology consultation pending.  Continue Mucinex.  Transition from IV Solu-Medrol to oral prednisone.  Continue scheduled nebs, Pulmicort.  Outpatient follow-up with oncology.   2.  New onset atrial fibrillation CHA2DSVASC = 2 Patient noted to have heart rates sustained in the 150s the morning of 03/20/2018.  Patient noted on exam to have a cardiac exam which was irregularly irregular.  New onset A. fib likely secondary to lung mass.   Patient converted back to normal sinus rhythm afternoon of 03/20/2018.  Cardiac enzymes negative x2.  Magnesium of 1.7 and will replete.  TSH within normal limits at 1.354.  2D echo with normal left ventricular size with mild LVH.  EF 60 to 65%.  No significant valvular abnormalities.  Patient was started on a Cardizem drip and as patient converted to normal sinus rhythm patient was transitioned to oral Cardizem 30 mg every 6 hours per cardiology.  Patient seen in consultation by cardiology and Cardizem has been changed to Cardizem  CD 120 mg daily.  Per cardiology as patient has a need for possible repeat biopsy will hold off on anticoagulation at this time.  If patient is to have a recurrence of A. fib may consider Eliquis.  Continue aspirin 81 mg daily.  Outpatient follow-up with cardiology.    3.  Probable COPD exacerbation Patient with poor air movement.  Shortness of breath on minimal exertion, expiratory wheezing noted on examination on 03/19/2018.  Clinical improvement after patient being started on Pulmicort, scheduled duo nebs, IV Solu-Medrol, Levaquin.  Continue Mucinex.  Continue Tussionex as needed.  IV Solu-Medrol has been transitioned to oral prednisone.  Follow.   4.  Chronic anemia Patient denies any overt bleeding.  No hemoptysis.  No hematemesis.  No melena.  No hematochezia.  Anemia panel with iron level of 14, TIBC decreased at 213.  Ferritin of 103.  Hemoglobin currently at 9.5 from 8.7 from 8.9.  Follow H&H.  Transfusion threshold hemoglobin less than 7.  Oncology evaluation pending.  5.  Hypertensive urgency Patient noted to have systolic blood pressures in the 190s and 180s early on during the hospitalization.  Blood pressure improved on Norvasc and lisinopril. Is noted on review of the chart that patient has had a history  of hypertension was supposed to be on antihypertensive medications.  Patient states has not been on antihypertensive medications in over a year and has not seen a PCP since then.  Patient on IV Lasix now and as such lisinopril was discontinued.  Patient started on Cardizem due to new onset A. fib.  Norvasc has been discontinued.  Place on lisinopril 10 mg daily.  Hydralazine as needed.  Will need outpatient follow-up.   6.  Hypercalcemia Corrected calcium was 12.3 on admission.  Concern for underlying malignancy.  Patient asymptomatic.  Phosphorus level 2.2.  Magnesium was 1.7.  Ionized calcium 6.1.  Vitamin D 25 hydroxy was 9.8.  PTH was low at 8.  Continue hydration with IV fluids.   Calcium levels trending down.  Corrected calcium level at 10.  Decrease IV fluid rate to 75 cc/h.  Increased Lasix to 20 mg IV every 12 hours.  We will give extra dose of Lasix 40 mg IV x1.  Urine output of 1.350 L over the past 24 hours. Status post IV Zometa.  Continue calcitonin nasal spray.  Outpatient follow-up with oncology.    7.  Bilateral upper extremity swelling Likely secondary to volume overload from IV fluids being used to manage hypercalcemia.  Decrease IV fluids to 75 cc/h.  Patient on IV Lasix 20 mg every 12 hours.  We will give an extra dose of Lasix 40 mg IV x1.  Follow.  8.  Hypokalemia Likely secondary to diuretics.  Check a magnesium level.  Replete.    DVT prophylaxis: Lovenox Code Status: Full Family Communication: Updated patient.  No family at bedside. Disposition Plan: Pending work-up.  Likely home once medically stable.  Patient was seen in July and was lost to follow-up.   Consultants:   Interventional radiology  Oncology pending  Cardiology: Dr. Angelena Form 03/20/2018  Procedures:   CT angiogram chest 03/17/2018  CT abdomen and pelvis 03/18/2018  Chest x-ray 03/17/2018  CT-guided biopsy right upper lobe mass--- large cystic area with frank serous fluid returned, no tissue obtained.  Per Dr. Earleen Newport IR 03/18/2018  Antimicrobials:   Levaquin 03/19/2018   Subjective: In bed.  Some improvement with shortness of breath.  Coughing improved.  No hemoptysis, no chest pain.  Still in sinus rhythm.    Objective: Vitals:   03/21/18 1926 03/21/18 2124 03/22/18 0446 03/22/18 0736  BP:  (!) 181/89 (!) 178/106   Pulse:  85 85   Resp:  (!) 24 18   Temp:  97.8 F (36.6 C) 97.8 F (36.6 C)   TempSrc:  Oral Oral   SpO2: 94% 95% 97% 96%  Weight:      Height:        Intake/Output Summary (Last 24 hours) at 03/22/2018 1151 Last data filed at 03/22/2018 0753 Gross per 24 hour  Intake 2390 ml  Output 1750 ml  Net 640 ml   Filed Weights   03/17/18 1612  03/17/18 2131  Weight: 56.7 kg 47.2 kg    Examination:  General exam: NAD. Respiratory system: Fair air movement.  Minimal expiratory wheezing.  Less tight.  No use of accessory muscles of respiration.  Speaking in full sentences.  Cardiovascular system: RRR no murmurs rubs or gallops.  No JVD.  No lower extremity edema.  Gastrointestinal system: Abdomen is nontender, nondistended, soft, positive bowel sounds.  No rebound.  No guarding.  Central nervous system: Alert and oriented. No focal neurological deficits. Extremities: Bilateral upper extremity swelling.  Symmetric 5 x 5 power. Skin:  No rashes, lesions or ulcers Psychiatry: Judgement and insight appear normal. Mood & affect appropriate.     Data Reviewed: I have personally reviewed following labs and imaging studies  CBC: Recent Labs  Lab 03/17/18 1700 03/18/18 0256 03/19/18 0533 03/20/18 0552 03/21/18 0437 03/22/18 0525  WBC 7.4 9.0 12.3* 13.7* 16.0* 12.8*  NEUTROABS 5.8  --   --  12.8* 15.2* 11.0*  HGB 9.0* 8.6* 8.5* 8.9* 8.7* 9.5*  HCT 28.0* 27.4* 26.9* 27.9* 26.9* 29.2*  MCV 94.6 95.5 95.4 94.3 94.1 93.0  PLT 306 310 306 292 338 161   Basic Metabolic Panel: Recent Labs  Lab 03/18/18 0826 03/18/18 1734 03/19/18 0533 03/20/18 0552 03/21/18 0437 03/22/18 0525  NA 132*  --  134* 131* 135 135  K 3.5  --  4.3 4.3 4.1 3.2*  CL 101  --  101 100 102 99  CO2 24  --  25 25 25 28   GLUCOSE 101*  --  105* 142* 198* 112*  BUN 8  --  7* 10 17 15   CREATININE 0.64  --  0.61 0.55 0.56 0.43*  CALCIUM 10.7*  10.9*  --  10.9* 11.1* 10.0 8.8*  MG 1.7  --  2.1  --  1.7 2.5*  PHOS  --  2.2*  --  3.3 2.0*  --    GFR: Estimated Creatinine Clearance: 54.3 mL/min (A) (by C-G formula based on SCr of 0.43 mg/dL (L)). Liver Function Tests: Recent Labs  Lab 03/17/18 1700 03/18/18 0826 03/19/18 0533 03/21/18 0437  AST 12* 11* 12* 18  ALT 8 8 8 11   ALKPHOS 69 64 61 60  BILITOT 0.6 0.5 0.8 0.2*  PROT 7.8 7.7 7.5 7.3    ALBUMIN 2.9* 3.0* 3.0* 2.5*   No results for input(s): LIPASE, AMYLASE in the last 168 hours. No results for input(s): AMMONIA in the last 168 hours. Coagulation Profile: Recent Labs  Lab 03/17/18 2006  INR 1.16   Cardiac Enzymes: Recent Labs  Lab 03/20/18 1206 03/20/18 2323  TROPONINI <0.03 <0.03   BNP (last 3 results) No results for input(s): PROBNP in the last 8760 hours. HbA1C: No results for input(s): HGBA1C in the last 72 hours. CBG: No results for input(s): GLUCAP in the last 168 hours. Lipid Profile: No results for input(s): CHOL, HDL, LDLCALC, TRIG, CHOLHDL, LDLDIRECT in the last 72 hours. Thyroid Function Tests: Recent Labs    03/20/18 1206  TSH 1.354   Anemia Panel: No results for input(s): VITAMINB12, FOLATE, FERRITIN, TIBC, IRON, RETICCTPCT in the last 72 hours. Sepsis Labs: No results for input(s): PROCALCITON, LATICACIDVEN in the last 168 hours.  No results found for this or any previous visit (from the past 240 hour(s)).       Radiology Studies: No results found.      Scheduled Meds: . aspirin EC  81 mg Oral Daily  . budesonide (PULMICORT) nebulizer solution  0.5 mg Nebulization BID  . dextromethorphan-guaiFENesin  1 tablet Oral BID  . diltiazem  120 mg Oral Daily  . enoxaparin (LOVENOX) injection  40 mg Subcutaneous Q24H  . feeding supplement (ENSURE ENLIVE)  237 mL Oral BID BM  . furosemide  20 mg Intravenous Q12H  . furosemide  40 mg Intravenous Once  . ipratropium  0.5 mg Nebulization TID  . levalbuterol  0.63 mg Nebulization TID  . levofloxacin  500 mg Oral Daily  . lisinopril  10 mg Oral Daily  . multivitamin with minerals  1 tablet Oral Daily  .  potassium chloride  40 mEq Oral Q4H  . predniSONE  60 mg Oral QAC breakfast   Continuous Infusions: . sodium chloride 125 mL/hr at 03/22/18 0446     LOS: 4 days    Time spent: 40 minutes    Irine Seal, MD [facility] Pager [phone]  If 7PM-7AM, please  contact night-coverage [email or web address] Password [email or web address] 03/22/2018, 11:51 AM

## 2018-03-23 ENCOUNTER — Telehealth: Payer: Self-pay

## 2018-03-23 ENCOUNTER — Encounter (HOSPITAL_COMMUNITY): Payer: Medicaid Other

## 2018-03-23 ENCOUNTER — Encounter: Payer: Self-pay | Admitting: *Deleted

## 2018-03-23 ENCOUNTER — Ambulatory Visit: Payer: Medicaid Other | Admitting: Family Medicine

## 2018-03-23 LAB — BASIC METABOLIC PANEL
Anion gap: 8 (ref 5–15)
BUN: 19 mg/dL (ref 8–23)
CO2: 28 mmol/L (ref 22–32)
CREATININE: 0.57 mg/dL (ref 0.44–1.00)
Calcium: 8.4 mg/dL — ABNORMAL LOW (ref 8.9–10.3)
Chloride: 102 mmol/L (ref 98–111)
GFR calc Af Amer: >60 mL/min (ref >60)
Glucose, Bld: 93 mg/dL (ref 70–99)
POTASSIUM: 3.7 mmol/L (ref 3.5–5.1)
SODIUM: 138 mmol/L (ref 135–145)

## 2018-03-23 MED ORDER — FUROSEMIDE 10 MG/ML IJ SOLN
40.0000 mg | Freq: Two times a day (BID) | INTRAMUSCULAR | Status: AC
  Administered 2018-03-23: 40 mg via INTRAVENOUS
  Filled 2018-03-23 (×2): qty 4

## 2018-03-23 MED ORDER — LISINOPRIL 20 MG PO TABS
20.0000 mg | ORAL_TABLET | Freq: Every day | ORAL | Status: DC
  Administered 2018-03-24 – 2018-03-28 (×5): 20 mg via ORAL
  Filled 2018-03-23 (×5): qty 1

## 2018-03-23 MED ORDER — POTASSIUM CHLORIDE CRYS ER 20 MEQ PO TBCR
40.0000 meq | EXTENDED_RELEASE_TABLET | Freq: Once | ORAL | Status: AC
  Administered 2018-03-23: 40 meq via ORAL
  Filled 2018-03-23: qty 2

## 2018-03-23 MED ORDER — LISINOPRIL 10 MG PO TABS: 10.0000 mg | ORAL_TABLET | Freq: Once | ORAL | Status: DC

## 2018-03-23 MED ORDER — OXYCODONE-ACETAMINOPHEN 5-325 MG PO TABS
1.0000 | ORAL_TABLET | Freq: Three times a day (TID) | ORAL | Status: DC | PRN
  Administered 2018-03-23 – 2018-03-28 (×11): 1 via ORAL
  Filled 2018-03-23 (×12): qty 1

## 2018-03-23 MED ORDER — FUROSEMIDE 10 MG/ML IJ SOLN
40.0000 mg | Freq: Once | INTRAMUSCULAR | Status: AC
  Administered 2018-03-23: 40 mg via INTRAVENOUS
  Filled 2018-03-23: qty 4

## 2018-03-23 NOTE — Progress Notes (Addendum)
O2 sat off oxygen for 20 minutes is 100%. 1830 O2 SAT room air 96%

## 2018-03-23 NOTE — Progress Notes (Signed)
Oncology Nurse Navigator Documentation  Oncology Nurse Navigator Flowsheets 03/23/2018  Navigator Location [facility]  Navigator Encounter Type Follow-up Appt  Patient Visit Type Inpatient/I came to see Sonya [name] today. I updated her on appt and asked that she call her PCP to re-schedule per Dr. Worthy Flank request.  She verbalized understanding of appt and to call PCP and re-schedule.   Treatment Phase Abnormal Scans  Barriers/Navigation Needs Education;Coordination of Care  Education Other  Interventions Coordination of Care;Education  Coordination of Care Appts  Acuity Level 2  Time Spent with Patient 30

## 2018-03-23 NOTE — Progress Notes (Signed)
PROGRESS NOTE    Sonya [name]  VOZ:[id number] DOB: 31-Dec-1955 DOA: 03/17/2018 PCP: Default, Provider, MD   Brief Narrative:  Sonya [name] is a 62 y.o. female with medical history significant of right lung mass presenting to the hospital for evaluation of shortness of breath. 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  Patient was offered admission for further work-up at that time but she declined it.  Plan was for her to follow-up with her primary care provider. Patient states she did not follow-up with a doctor since her ED discharge in July.  She continued to smoke cigarettes and quit a week ago; history of smoking 1 pack/day for 37 years.  States that she has been feeling short of breath with exertion and when lying down for the past 2 weeks.  Also having a cough productive of white-colored sputum.  Symptoms have been getting progressively worse.  Denies having any hemoptysis.  She has unintentionally lost 5 to 6 pounds since July.  Denies having any chest pain.  Denies having any hematemesis, melena, or hematochezia.  ED Course: Afebrile.  Tachycardic initially but now pulse normal.  Continues to be tachypneic.  Not hypotensive.  Satting well on room air.  No leukocytosis.  Corrected calcium 12.3.  I-STAT troponin negative.  EKG not suggestive of ACS.  Chest x-ray showing known right suprahilar mass with associated upper lobe atelectasis.  CTA chest showing no pulmonary embolus, however, increased vascular invasion and/or mass-effect by the right suprahilar mass, resulting in new occlusion of the right middle lobe pulmonary artery and persistent right upper lobe pulmonary artery occlusion as well as occlusion/invasion of the superior vena cava.  Debris within the right mainstem bronchus.  Right upper lobe atelectasis.  CT  abdomen pelvis showing no acute abnormality.  ED physician discussed the case with Dr. Jana Hakim from oncology who recommended starting the patient on IV heparin and consulting IR in the morning for biopsy. TRH paged to admit.   Oncology was consulted and awaiting evaluation.  IR was consulted CT biopsy was attempted however tissue was not able to be obtained as serous fluid was what was collected.  Pulmonary was curb sided however felt patient was not a candidate at this time for bronchoscopy due to findings on CT scan and concern of puncturing pulmonary artery bronchoscopy was undertaken. Patient subsequently noted to be in acute COPD exacerbation placed on steroids, nebulizer treatments, antibiotics. On 03/20/2018 patient noted to go into new onset atrial fibrillation.    Assessment & Plan:   Principal Problem:   Pulmonary mass Active Problems:   Chronic anemia   Hypercalcemia   Hypertensive urgency   HTN (hypertension)   Lung mass   Occlusion of pulmonary artery (HCC)   COPD with acute exacerbation (HCC)   Tachycardia   Atrial fibrillation with RVR (HCC)   New onset atrial fibrillation (HCC)  #1 pulmonary mass occluding/invading right pulmonary artery and SVC/lung mass with SVC Patient presented to the ED with 2-week history of worsening dyspnea and cough in addition to on intentional weight loss.  Patient seen in the ED December 04, 2017 CT chest at that time revealed large right upper lobe/suprahilar mass involving right upper lobe bronchus, right upper lobe pulmonary artery and SVC.  Patient refused admission at that time and was subsequently lost to follow-up.  Patient has  not seen a PCP in over 1 to 2 years.  Patient with ongoing tobacco abuse however states quit 1 week ago.  CT angiogram chest done showed increasing vascular invasion and no mass-effect of the right suprahilar mass, resulting in new occlusion of right middle lobe pulmonary artery and persistent right upper lobe pulmonary  artery occlusion as well as occlusion/invasion of the SVC.  Debris noted in the right mainstem bronchus.  Right upper lobe atelectasis.  Case was discussed with pulmonary who looked at the CT findings and did not feel a bronchoscopy at this time will be of any benefit and increased risk, due to external compression from mass.  IR has been consulted to evaluate for possible biopsy.  IR  Was concerned that may not get a good sample as may likely have some necrosis however CT-guided biopsy was done on 03/18/2018, which showed a large cystic area with frank serous fluid returned and no tissue obtained.  IR recommended PET/CT to determine best target versus bronchoscopy as an alternative next step.  Discussed again with pulmonary who feel concerned that bronchoscopy may lead to incidental laceration of pulmonary artery and feel currently risks right now bronchoscopy are high and recommending alternative of PET/CT as recommended per interventional radiology.  Oncology consultation pending.  Continue Mucinex.  Continue prednisone, scheduled nebs, Pulmicort.  Discussed case with Dr. Lorna Few of oncology who has graciously agreed to see patient for further evaluation and management in the outpatient setting on Friday, 03/27/2018.  Outpatient follow-up with oncology.   2.  New onset atrial fibrillation CHA2DSVASC = 2 Patient noted to have heart rates sustained in the 150s the morning of 03/20/2018.  Patient noted on exam to have a cardiac exam which was irregularly irregular.  New onset A. fib likely secondary to lung mass.   Patient converted back to normal sinus rhythm afternoon of 03/20/2018.  Cardiac enzymes negative x2.  Magnesium of 1.7 and will replete.  TSH within normal limits at 1.354.  2D echo with normal left ventricular size with mild LVH.  EF 60 to 65%.  No significant valvular abnormalities.  Patient was started on a Cardizem drip and as patient converted to normal sinus rhythm patient was  transitioned to oral Cardizem 30 mg every 6 hours per cardiology.  Patient seen in consultation by cardiology and Cardizem has been changed to Cardizem CD 120 mg daily.  Per cardiology as patient has a need for possible repeat biopsy will hold off on anticoagulation at this time.  If patient is to have a recurrence of A. fib may consider Eliquis.  Continue aspirin 81 mg daily.  Outpatient follow-up with cardiology.    3.  Probable COPD exacerbation Patient with poor air movement.  Shortness of breath on minimal exertion, expiratory wheezing noted on examination on 03/19/2018.  Clinical improvement after patient being started on Pulmicort, scheduled duo nebs, IV Solu-Medrol, Levaquin.  Continue Mucinex.  Continue Tussionex as needed.  IV Solu-Medrol has been transitioned to oral prednisone.  Will discontinue Levaquin after today's dose.  Follow.   4.  Chronic anemia Patient denies any overt bleeding.  No hemoptysis.  No hematemesis.  No melena.  No hematochezia.  Anemia panel with iron level of 14, TIBC decreased at 213.  Ferritin of 103.  Hemoglobin currently at 9.5 from 8.7 from 8.9.  Follow H&H.  Transfusion threshold hemoglobin less than 7.  Oncology evaluation pending.  5.  Hypertensive urgency Patient noted to have systolic blood pressures in the 190s  and 180s early on during the hospitalization.  Blood pressure improved on Norvasc and lisinopril. Is noted on review of the chart that patient has had a history of hypertension was supposed to be on antihypertensive medications.  Patient states has not been on antihypertensive medications in over a year and has not seen a PCP since then.  Patient on IV Lasix now and as such lisinopril was discontinued.  Patient started on Cardizem due to new onset A. fib.  Norvasc has been discontinued.  Increase lisinopril to 20 mg daily.  Hydralazine as needed.  Will need outpatient follow-up.   6.  Hypercalcemia Corrected calcium was 12.3 on admission.  Concern for  underlying malignancy.  Patient asymptomatic.  Phosphorus level 2.2.  Magnesium was 1.7.  Ionized calcium 6.1.  Vitamin D 25 hydroxy was 9.8.  PTH was low at 8.  Calcium levels trending down.  Corrected calcium level at 9.6.  Saline lock IV fluids.  Continue IV Lasix for the next 24 hours.  Urine output of 3.550 L over the past 24 hours.  Status post IV Zometa.  Continue calcitonin nasal spray.  Outpatient follow-up with oncology.    7.  Bilateral upper extremity swelling Likely secondary to volume overload from IV fluids being used to manage hypercalcemia.  Saline lock IV fluids.  Patient on Lasix 20 mg IV every 12 hours.  We will give an extra dose of Lasix 40 mg IV x1.  Check Dopplers of bilateral upper extremities to exclude DVT as patient in a hypercoagulable state due to lung mass.  Follow.  8.  Hypokalemia Likely secondary to diuretics.  Magnesium level at 2.5.  Potassium at 3.7 today.  We will give extra dose of IV Lasix and the extra dose of oral potassium.  Follow.     DVT prophylaxis: Lovenox Code Status: Full Family Communication: Updated patient.  No family at bedside. Disposition Plan: Pending work-up.  Likely home with home health once medically stable in the next 24 to 48 hours.   Consultants:   Interventional radiology  Oncology pending  Cardiology: Dr. Angelena Form 03/20/2018  Procedures:   CT angiogram chest 03/17/2018  CT abdomen and pelvis 03/18/2018  Chest x-ray 03/17/2018  CT-guided biopsy right upper lobe mass--- large cystic area with frank serous fluid returned, no tissue obtained.  Per Dr. Earleen Newport IR 03/18/2018  Antimicrobials:   Levaquin 03/19/2018   Subjective: In bed.  States breathing is improving.  Still with a cough.  No chest pain.  No hemoptysis.  In sinus rhythm.    Objective: Vitals:   03/22/18 2115 03/23/18 0449 03/23/18 0546 03/23/18 0725  BP: (!) 151/83 (!) 153/88    Pulse: 91 (!) 56 84   Resp: 18 16    Temp: 98.4 F (36.9 C) 97.9 F  (36.6 C)    TempSrc: Oral Oral    SpO2: 98% 100%  100%  Weight:      Height:        Intake/Output Summary (Last 24 hours) at 03/23/2018 1026 Last data filed at 03/23/2018 0651 Gross per 24 hour  Intake 120 ml  Output 3150 ml  Net -3030 ml   Filed Weights   03/17/18 1612 03/17/18 2131  Weight: 56.7 kg 47.2 kg    Examination:  General exam: NAD. Respiratory system: Fair air movement.  Minimal expiratory wheezing.  Some coarse breath sounds.  Less tight.  No use of accessory muscles of respiration.  Speaking in full sentences.  Cardiovascular system: Regular rate and  rhythm no murmurs rubs or gallops.  No JVD.  No lower extremity edema. Gastrointestinal system: Abdomen is soft, nontender, nondistended, positive bowel sounds.  No rebound.  No guarding.   Central nervous system: Alert and oriented. No focal neurological deficits. Extremities: Bilateral upper extremity swelling with slight improvement.  Symmetric 5 x 5 power. Skin: No rashes, lesions or ulcers Psychiatry: Judgement and insight appear normal. Mood & affect appropriate.     Data Reviewed: I have personally reviewed following labs and imaging studies  CBC: Recent Labs  Lab 03/17/18 1700 03/18/18 0256 03/19/18 0533 03/20/18 0552 03/21/18 0437 03/22/18 0525  WBC 7.4 9.0 12.3* 13.7* 16.0* 12.8*  NEUTROABS 5.8  --   --  12.8* 15.2* 11.0*  HGB 9.0* 8.6* 8.5* 8.9* 8.7* 9.5*  HCT 28.0* 27.4* 26.9* 27.9* 26.9* 29.2*  MCV 94.6 95.5 95.4 94.3 94.1 93.0  PLT 306 310 306 292 338 540   Basic Metabolic Panel: Recent Labs  Lab 03/18/18 0826 03/18/18 1734 03/19/18 0533 03/20/18 0552 03/21/18 0437 03/22/18 0525 03/23/18 0521  NA 132*  --  134* 131* 135 135 138  K 3.5  --  4.3 4.3 4.1 3.2* 3.7  CL 101  --  101 100 102 99 102  CO2 24  --  25 25 25 28 28   GLUCOSE 101*  --  105* 142* 198* 112* 93  BUN 8  --  7* 10 17 15 19   CREATININE 0.64  --  0.61 0.55 0.56 0.43* 0.57  CALCIUM 10.7*  10.9*  --  10.9* 11.1* 10.0  8.8* 8.4*  MG 1.7  --  2.1  --  1.7 2.5*  --   PHOS  --  2.2*  --  3.3 2.0*  --   --    GFR: Estimated Creatinine Clearance: 54.3 mL/min (by C-G formula based on SCr of 0.57 mg/dL). Liver Function Tests: Recent Labs  Lab 03/17/18 1700 03/18/18 0826 03/19/18 0533 03/21/18 0437  AST 12* 11* 12* 18  ALT 8 8 8 11   ALKPHOS 69 64 61 60  BILITOT 0.6 0.5 0.8 0.2*  PROT 7.8 7.7 7.5 7.3  ALBUMIN 2.9* 3.0* 3.0* 2.5*   No results for input(s): LIPASE, AMYLASE in the last 168 hours. No results for input(s): AMMONIA in the last 168 hours. Coagulation Profile: Recent Labs  Lab 03/17/18 2006  INR 1.16   Cardiac Enzymes: Recent Labs  Lab 03/20/18 1206 03/20/18 2323  TROPONINI <0.03 <0.03   BNP (last 3 results) No results for input(s): PROBNP in the last 8760 hours. HbA1C: No results for input(s): HGBA1C in the last 72 hours. CBG: No results for input(s): GLUCAP in the last 168 hours. Lipid Profile: No results for input(s): CHOL, HDL, LDLCALC, TRIG, CHOLHDL, LDLDIRECT in the last 72 hours. Thyroid Function Tests: Recent Labs    03/20/18 1206  TSH 1.354   Anemia Panel: No results for input(s): VITAMINB12, FOLATE, FERRITIN, TIBC, IRON, RETICCTPCT in the last 72 hours. Sepsis Labs: No results for input(s): PROCALCITON, LATICACIDVEN in the last 168 hours.  No results found for this or any previous visit (from the past 240 hour(s)).       Radiology Studies: No results found.      Scheduled Meds: . aspirin EC  81 mg Oral Daily  . budesonide (PULMICORT) nebulizer solution  0.5 mg Nebulization BID  . dextromethorphan-guaiFENesin  1 tablet Oral BID  . diltiazem  120 mg Oral Daily  . enoxaparin (LOVENOX) injection  40 mg Subcutaneous Q24H  .  feeding supplement (ENSURE ENLIVE)  237 mL Oral BID BM  . furosemide  40 mg Intravenous Q12H  . furosemide  40 mg Intravenous Once  . ipratropium  0.5 mg Nebulization TID  . levalbuterol  0.63 mg Nebulization TID  . levofloxacin   500 mg Oral Daily  . lisinopril  10 mg Oral Daily  . multivitamin with minerals  1 tablet Oral Daily  . potassium chloride  40 mEq Oral Once   Continuous Infusions:    LOS: 5 days    Time spent: 40 minutes    Irine Seal, MD [facility] Pager [phone]  If 7PM-7AM, please contact night-coverage [email or web address] Password [email or web address] 03/23/2018, 10:26 AM

## 2018-03-23 NOTE — Progress Notes (Signed)
Physical Therapy Treatment Patient Details Name: Sonya [name] MRN: [id number] DOB: 03-27-1956 Today's Date: 03/23/2018    History of Present Illness  62 y.o. female with medical history significant of right lung mass presenting to the hospital for evaluation of shortness of breath. 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    PT Comments    Progressing with mobility. Pt stated she feels a little better. She tolerated small increase in activity well. O2 sat 89-90% on RA during session.  Discussed possible need for a RW for safe ambulation.   Follow Up Recommendations  Home health PT;Supervision for mobility/OOB     Equipment Recommendations  (4 wheeled rolling walker)    Recommendations for Other Services       Precautions / Restrictions Precautions Precautions: Fall Precaution Comments: pt denies h/o falls Restrictions Weight Bearing Restrictions: No    Mobility  Bed Mobility Overal bed mobility: Modified Independent                Transfers Overall transfer level: Needs assistance   Transfers: Sit to/from Stand Sit to Stand: Min guard         General transfer comment: close guard for safety. Unsteady.   Ambulation/Gait Ambulation/Gait assistance: Min assist Gait Distance (Feet): 15 Feet(x2) Assistive device: None("furniture walk" ) Gait Pattern/deviations: Step-through pattern;Decreased stride length     General Gait Details: Unsteady. Slow gait speed. O2 sat 90% on RA during ambulation. Dyspnea 2/4.   Stairs             Wheelchair Mobility    Modified Rankin (Stroke Patients Only)       Balance Overall balance assessment: Mild deficits observed, not formally tested                                          Cognition Arousal/Alertness:  Awake/alert Behavior During Therapy: WFL for tasks assessed/performed Overall Cognitive Status: Within Functional Limits for tasks assessed                                        Exercises      General Comments        Pertinent Vitals/Pain Pain Assessment: Faces Faces Pain Scale: Hurts little more Pain Location: back Pain Descriptors / Indicators: Sore;Discomfort Pain Intervention(s): Premedicated before session;Limited activity within patient's tolerance    Home Living                      Prior Function            PT Goals (current goals can now be found in the care plan section) Progress towards PT goals: Progressing toward goals    Frequency    Min 3X/week      PT Plan Current plan remains appropriate    Co-evaluation              AM-PAC PT "6 Clicks" Daily Activity  Outcome Measure  Difficulty turning over in bed (including adjusting bedclothes, sheets and blankets)?: None Difficulty moving from lying on back to sitting on the side of the bed? : None Difficulty sitting down on and  standing up from a chair with arms (e.g., wheelchair, bedside commode, etc,.)?: A Little Help needed moving to and from a bed to chair (including a wheelchair)?: A Little Help needed walking in hospital room?: A Little Help needed climbing 3-5 steps with a railing? : A Little 6 Click Score: 20    End of Session   Activity Tolerance: Patient tolerated treatment well Patient left: in bed;with call bell/phone within reach;with bed alarm set   PT Visit Diagnosis: Unsteadiness on feet (R26.81);Difficulty in walking, not elsewhere classified (R26.2)     Time: 4158-3094 PT Time Calculation (min) (ACUTE ONLY): 13 min  Charges:  $Gait Training: 8-22 mins                        Weston Anna, PT Acute Rehabilitation Services Pager: [phone] Office: [phone]

## 2018-03-23 NOTE — Progress Notes (Signed)
Await documented 02 sats-@ rest,ambulating,recovery to determine if home 02 needed-Nsg notified.

## 2018-03-23 NOTE — Telephone Encounter (Signed)
Patient's appointment rescheduled to 03/27/18 @ 1050 due to patient still hospitalized.

## 2018-03-23 NOTE — Progress Notes (Signed)
Occupational Therapy Treatment Patient Details Name: Pauline Pegues MRN: [id number] DOB: May 01, 1956 Today's Date: 03/23/2018    History of present illness  62 y.o. female with medical history significant of right lung mass presenting to the hospital for evaluation of shortness of breath. 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   OT comments  Pt progressing toward stated goals, focused session on tub/shower t/f this date. Supplied pt with shower chair and set up like BR at home, pt completing at supervision-min guard level of A. Pt occasionally unsteady when moving quickly. Once seated on shower chair, pt demonstrating ability to reach feet for LB bathing at mod I. DOE noted throughout session with coughing, sats stable on 2 LO2.Continued education of ECS for pt to apply during functional transfer, pt in understanding. Furthered ECS education to daily routine, pt in understanding. Administered UE theraband program after pt requesting some exercises. Pt noted with weak cough in session, encouraged coughing to improve pulmonary status for engagement in BADL. Gave [place] ball for strength and to help reduce edema in LUE. Pt showing appropriate use of both exercise means. D/c plans remain appropriate at this time, will continue to follow pt in acute setting.  Follow Up Recommendations  Supervision/Assistance - 24 hour    Equipment Recommendations  None recommended by OT;Other (comment)(gave UE theraband program)    Recommendations for Other Services      Precautions / Restrictions Precautions Precautions: Fall Restrictions Weight Bearing Restrictions: No       Mobility Bed Mobility Overal bed mobility: Modified Independent                Transfers Overall transfer level: Needs assistance Equipment used: 1 person hand  held assist Transfers: Sit to/from Stand Sit to Stand: Min guard         General transfer comment: close guard for safety. Unsteady.     Balance Overall balance assessment: Mild deficits observed, not formally tested                                         ADL either performed or assessed with clinical judgement   ADL Overall ADL's : Needs assistance/impaired                         Toilet Transfer: Supervision/safety;Regular Glass blower/designer Details (indicate cue type and reason): simulated through toilet t/f     Tub/ Shower Transfer: Supervision/safety;Min guard;Shower seat;Tub bench Tub/Shower Transfer Details (indicate cue type and reason): pt practiced with shower seat in room, set up like at home, completed t/f with no DME at supervision level of A, ming guard after increased fatigue Functional mobility during ADLs: Supervision/safety General ADL Comments: focused session on tub transfer, completed at supervision level of A. Pt demonstrates how she reaches feet well, much DOE and coughing with movement. Educated on ECS throughout session. Pt otherwise mod I for bed mobility and supervision level of A for functional mobility in room, increased A with increased fatigue     Vision Baseline Vision/History: No visual deficits Patient Visual Report: No change from baseline     Perception     Praxis      Cognition Arousal/Alertness: Awake/alert Behavior During  Therapy: WFL for tasks assessed/performed Overall Cognitive Status: Within Functional Limits for tasks assessed                                          Exercises     Shoulder Instructions       General Comments edema in LUE    Pertinent Vitals/ Pain       Pain Assessment: Faces Faces Pain Scale: Hurts a little bit Pain Location: back Pain Descriptors / Indicators: Sore;Discomfort Pain Intervention(s): Limited activity within patient's tolerance  Home  Living Family/patient expects to be discharged to:: Private residence                                        Prior Functioning/Environment              Frequency  Min 2X/week        Progress Toward Goals  OT Goals(current goals can now be found in the care plan section)  Progress towards OT goals: Progressing toward goals  Acute Rehab OT Goals Patient Stated Goal: return to independence OT Goal Formulation: With patient Time For Goal Achievement: 04/02/18 Potential to Achieve Goals: Good  Plan Discharge plan remains appropriate;Frequency remains appropriate    Co-evaluation                 AM-PAC PT "6 Clicks" Daily Activity     Outcome Measure   Help from another person eating meals?: None Help from another person taking care of personal grooming?: A Little Help from another person toileting, which includes using toliet, bedpan, or urinal?: A Little Help from another person bathing (including washing, rinsing, drying)?: A Little Help from another person to put on and taking off regular upper body clothing?: A Little Help from another person to put on and taking off regular lower body clothing?: A Little 6 Click Score: 19    End of Session    OT Visit Diagnosis: Muscle weakness (generalized) (M62.81)   Activity Tolerance Patient tolerated treatment well   Patient Left in bed;with call bell/phone within reach;with bed alarm set   Nurse Communication          Time: 4696-2952 OT Time Calculation (min): 13 min  Charges: OT General Charges $OT Visit: 1 Visit OT Treatments $Self Care/Home Management : 8-22 mins  Zenovia Jarred, MSOT, OTR/L Behavioral Health OT/ Acute Relief OT [facility] Office: [phone]  Zenovia Jarred 03/23/2018, 4:49 PM

## 2018-03-24 ENCOUNTER — Inpatient Hospital Stay (HOSPITAL_COMMUNITY): Payer: Medicaid Other

## 2018-03-24 DIAGNOSIS — F17210 Nicotine dependence, cigarettes, uncomplicated: Secondary | ICD-10-CM

## 2018-03-24 DIAGNOSIS — I82629 Acute embolism and thrombosis of deep veins of unspecified upper extremity: Secondary | ICD-10-CM | POA: Diagnosis present

## 2018-03-24 DIAGNOSIS — M7989 Other specified soft tissue disorders: Secondary | ICD-10-CM

## 2018-03-24 DIAGNOSIS — I82623 Acute embolism and thrombosis of deep veins of upper extremity, bilateral: Secondary | ICD-10-CM

## 2018-03-24 LAB — COMPREHENSIVE METABOLIC PANEL
ALBUMIN: 2.7 g/dL — AB (ref 3.5–5.0)
ALT: 17 U/L (ref 0–44)
ANION GAP: 9 (ref 5–15)
AST: 21 U/L (ref 15–41)
Alkaline Phosphatase: 52 U/L (ref 38–126)
BILIRUBIN TOTAL: 0.5 mg/dL (ref 0.3–1.2)
BUN: 23 mg/dL (ref 8–23)
CALCIUM: 8.9 mg/dL (ref 8.9–10.3)
CO2: 28 mmol/L (ref 22–32)
Chloride: 100 mmol/L (ref 98–111)
Creatinine, Ser: 0.59 mg/dL (ref 0.44–1.00)
GLUCOSE: 99 mg/dL (ref 70–99)
POTASSIUM: 3.9 mmol/L (ref 3.5–5.1)
Sodium: 137 mmol/L (ref 135–145)
TOTAL PROTEIN: 7.4 g/dL (ref 6.5–8.1)

## 2018-03-24 LAB — CBC
HCT: 28.6 % — ABNORMAL LOW (ref 36.0–46.0)
Hemoglobin: 9.3 g/dL — ABNORMAL LOW (ref 12.0–15.0)
MCH: 30.6 pg (ref 26.0–34.0)
MCHC: 32.5 g/dL (ref 30.0–36.0)
MCV: 94.1 fL (ref 80.0–100.0)
NRBC: 0 % (ref 0.0–0.2)
PLATELETS: 395 10*3/uL (ref 150–400)
RBC: 3.04 MIL/uL — AB (ref 3.87–5.11)
RDW: 14 % (ref 11.5–15.5)
WBC: 14.1 10*3/uL — ABNORMAL HIGH (ref 4.0–10.5)

## 2018-03-24 LAB — HEPARIN LEVEL (UNFRACTIONATED): HEPARIN UNFRACTIONATED: 0.54 [IU]/mL (ref 0.30–0.70)

## 2018-03-24 MED ORDER — PREDNISONE 20 MG PO TABS
40.0000 mg | ORAL_TABLET | Freq: Every day | ORAL | Status: DC
  Administered 2018-03-24 – 2018-03-25 (×2): 40 mg via ORAL
  Filled 2018-03-24: qty 2

## 2018-03-24 MED ORDER — HEPARIN BOLUS VIA INFUSION
3000.0000 [IU] | Freq: Once | INTRAVENOUS | Status: AC
  Administered 2018-03-24: 3000 [IU] via INTRAVENOUS
  Filled 2018-03-24: qty 3000

## 2018-03-24 MED ORDER — FUROSEMIDE 10 MG/ML IJ SOLN
40.0000 mg | Freq: Once | INTRAMUSCULAR | Status: AC
  Administered 2018-03-24: 40 mg via INTRAVENOUS
  Filled 2018-03-24: qty 4

## 2018-03-24 MED ORDER — HEPARIN (PORCINE) IN NACL 100-0.45 UNIT/ML-% IJ SOLN
1150.0000 [IU]/h | INTRAMUSCULAR | Status: AC
  Administered 2018-03-24 – 2018-03-25 (×2): 1100 [IU]/h via INTRAVENOUS
  Filled 2018-03-24 (×3): qty 250

## 2018-03-24 NOTE — Care Management Note (Signed)
Case Management Note  Patient Details  Name: Sonya [name] MRN: [id number] Date of Birth: [birth year]  Subjective/Objective: [facility] already following for HHPT/social worker @ d/c, they have already delivered rw to rm. Patient declines tub bench.No further CM needs.                   Action/Plan:dc home w/[facility]HC/dme.   Expected Discharge Date:  (unknown)               Expected Discharge Plan:  [place]  In-House Referral:     Discharge planning Services  CM Consult  Post Acute Care Choice:    Choice offered to:  Patient  DME Arranged:  [facility] rolling DME Agency:  [place] Arranged:  PT, Social Work [employer] Agency:  [place]  Status of Service:  Completed, signed off  If discussed at [employer], dates discussed:    Additional Comments:  Sonya Phi, RN 03/24/2018, 11:12 AM

## 2018-03-24 NOTE — Progress Notes (Signed)
Dr. Grandville Silos on the floor and updated that Vascular Tech is requesting to speak with . Dr. Grandville Silos spoke with Vascular tech gave MD the results of the Upper Extremity Doppler and new orders placed

## 2018-03-24 NOTE — Progress Notes (Signed)
ANTICOAGULATION CONSULT NOTE   Pharmacy Consult for heparin Indication: acute bilateral UE DVT  No Known Allergies  Patient Measurements: Height: 5\' 9"  (175.3 cm) Weight: 104 lb (47.2 kg) IBW/kg (Calculated) : 66.2 Heparin Dosing Weight: 47 kg  Vital Signs: Temp: 97.7 F (36.5 C) (11/05 1500) Temp Source: Oral (11/05 1500) BP: 130/77 (11/05 1500) Pulse Rate: 81 (11/05 1500)  Labs: Recent Labs    03/22/18 0525 03/23/18 0521 03/24/18 1020 03/24/18 1838  HGB 9.5*  --   --  9.3*  HCT 29.2*  --   --  28.6*  PLT 384  --   --  395  HEPARINUNFRC  --   --   --  0.54  CREATININE 0.43* 0.57 0.59  --     Estimated Creatinine Clearance: 54.3 mL/min (by C-G formula based on SCr of 0.59 mg/dL).  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  She presented to the ED on 03/17/18 with c/o SOB.  Heparin was started on admission for suspected PE. CTA on 10/29 came back negative for PE and heparin drip was d/ced on 10/30.  She underwent CT guided biopsy of right lung mass on 10/30, but was not able to obtain tissue.  Upper extremities doppler obtained on 11/5 to evaluate swelling showed bilateral acute DVTs.  To start heparin drip for DVT.  Today, 03/24/2018: - last cbc on 11/3 was relatively stable - no bleeding documented - last dose of lovenox 40 mg SQ given on 11/4 at 10p  2nd shift update: - 18:38 heparin level = 0.54 with heparin @ 1100 units/hr - No complications of therapy noted  Goal of Therapy:  Heparin level 0.3-0.7 units/ml Monitor platelets by anticoagulation protocol: Yes   Plan:  - Continue heparin drip at 1100 units/hr. - Recheck 6 hr heparin level - monitor for s/s bleeding  Javius Sylla, Toribio Harbour, PharmD 03/24/2018,7:26 PM

## 2018-03-24 NOTE — Progress Notes (Signed)
[place] for heparin Indication: acute bilateral UE DVT  No Known Allergies  Patient Measurements: Height: 5\' 9"  (175.3 cm) Weight: 104 lb (47.2 kg) IBW/kg (Calculated) : 66.2 Heparin Dosing Weight: 47 kg  Vital Signs: Temp: 97.7 F (36.5 C) (11/05 0442) Temp Source: Oral (11/05 0442) BP: 168/88 (11/05 0842) Pulse Rate: 75 (11/05 0442)  Labs: Recent Labs    03/22/18 0525 03/23/18 0521 03/24/18 1020  HGB 9.5*  --   --   HCT 29.2*  --   --   PLT 384  --   --   CREATININE 0.43* 0.57 0.59    Estimated Creatinine Clearance: 54.3 mL/min (by C-G formula based on SCr of 0.59 mg/dL).  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  She presented to the ED on 03/17/18 with c/o SOB.  Heparin was started on admission for suspected PE. CTA on 10/29 came back negative for PE and heparin drip was d/ced on 10/30.  She underwent CT guided biopsy of right lung mass on 10/30, but was not able to obtain tissue.  Upper extremities doppler obtained on 11/5 to evaluate swelling showed bilateral acute DVTs.  To start heparin drip for DVT.  Today, 03/24/2018: - last cbc on 11/3 was relatively stable - no bleeding documented - last dose of lovenox 40 mg SQ given on 11/4 at 10p  Goal of Therapy:  Heparin level 0.3-0.7 units/ml Monitor platelets by anticoagulation protocol: Yes   Plan:  - d/c lovenox 40 mg SQ - heparin 3000 units IV x1 bolus, then heparin drip at 1100 units/hr (based on previous experience with heparin dosing for patient). - check 6 hr heparin level - monitor for s/s bleeding  Luke Rigsbee P 03/24/2018,12:19 PM

## 2018-03-24 NOTE — Progress Notes (Signed)
Noted doppler results-Acute bilateral UE DVT-has medicaid-checked formulary meds-enoxaparin syringe on  Preferred list.

## 2018-03-24 NOTE — Plan of Care (Signed)
Pt was calm and cooperative throughout shift. Pt only required pain meds 1 X this shift.

## 2018-03-24 NOTE — Consult Note (Signed)
[place] [phone]   [phone]  CONSULT NOTE  REFERRING PHYSICIAN: Dr. Irine Seal  REASON FOR CONSULTATION:  62 years old African-American female with highly suspicious lung cancer.  HPI Sonya [name] is a 62 y.o. female was long history for smoking and past medical history significant for COPD, atrial fibrillation, hypertension, chronic back pain as well as scoliosis.  The patient mentioned that she was evaluated at the emergency department in July 2019 and CT scan of the chest at that time showed a large right upper lobe/suprahilar mass involving the right upper lobe bronchus, right upper lobe pulmonary artery and SVC.  She declined to be admitted at that time for further evaluation of her disease.  The patient was also noncompliant with follow-up with her primary care physician for referral and evaluation of the lung mass.  Over the last few weeks she has been complaining of increasing weakness and fatigue as well as shortness of breath.  She presented to the emergency department again complaining of worsening dyspnea as well as cough productive of whitish sputum.  Repeat CT angiogram of the chest on March 17, 2018 showed no evidence for pulmonary embolism but there was increased vascular invasion and or mass-effect by the right suprahilar mass resulting in new occlusion of the right middle lobe pulmonary artery and persistent right upper lobe pulmonary artery occlusion as well as occlusion/invasion of the superior vena cava.  There was debris's within the right mainstem bronchus as well as right upper lobe atelectasis.  MRI of the brain without contrast on March 18, 2018 showed motion degraded examination without acute intracranial process and no definite intracranial metastasis on this noncontrast imaging.  The patient had an attempt for CT-guided right upper lobe mass biopsy but the needle aspirated only frankly serous fluid with no  positive cytology. I was consulted to see the patient today for evaluation and recommendation regarding her condition. When seen today she is feeling a little bit better but continues to have shortness of breath at baseline increased with exertion as well as fatigue.  She denied having any chest pain but has cough with no hemoptysis.  She denied having any headache or visual changes.  She has no nausea, vomiting, diarrhea or constipation.  HPI  Past Medical History:  Diagnosis Date  . Atrial fibrillation ([place])   . Bronchitis   . Chronic back pain   . COPD (chronic obstructive pulmonary disease) ([place])   . HTN (hypertension)   . Scoliosis     History reviewed. No pertinent surgical history.  Family History  Problem Relation Age of Onset  . Heart disease Brother     Social History Social History   Tobacco Use  . Smoking status: Current Every Day Smoker    Packs/day: 0.25  . Smokeless tobacco: Never Used  Substance Use Topics  . Alcohol use: Not Currently  . Drug use: No    No Known Allergies  Current Facility-Administered Medications  Medication Dose Route Frequency Provider Last Rate Last Dose  . aspirin EC tablet 81 mg  81 mg Oral Daily Eugenie Filler, MD   81 mg at 03/23/18 7858  . budesonide (PULMICORT) nebulizer solution 0.5 mg  0.5 mg Nebulization BID Eugenie Filler, MD   0.5 mg at 03/24/18 0727  . chlorpheniramine-HYDROcodone (TUSSIONEX) 10-8 MG/5ML suspension 5 mL  5 mL Oral Q6H PRN Eugenie Filler, MD   5 mL at 03/22/18 2154  . dextromethorphan-guaiFENesin ([place] DM) 30-600  MG per 12 hr tablet 1 tablet  1 tablet Oral BID Shela Leff, MD   1 tablet at 03/23/18 2232  . diltiazem (CARDIZEM CD) 24 hr capsule 120 mg  120 mg Oral Daily Pixie Casino, MD   120 mg at 03/23/18 0836  . enoxaparin (LOVENOX) injection 40 mg  40 mg Subcutaneous Q24H Eugenie Filler, MD   40 mg at 03/23/18 2232  . feeding supplement (ENSURE ENLIVE) (ENSURE ENLIVE) liquid  237 mL  237 mL Oral BID BM Eugenie Filler, MD   237 mL at 03/23/18 1346  . hydrALAZINE (APRESOLINE) injection 10 mg  10 mg Intravenous Q6H PRN Eugenie Filler, MD   10 mg at 03/21/18 2135  . ipratropium (ATROVENT) nebulizer solution 0.5 mg  0.5 mg Nebulization TID Eugenie Filler, MD   0.5 mg at 03/24/18 0727  . ipratropium (ATROVENT) nebulizer solution 0.5 mg  0.5 mg Nebulization Q2H PRN Eugenie Filler, MD      . levalbuterol Penne Lash) nebulizer solution 0.63 mg  0.63 mg Nebulization TID Eugenie Filler, MD   0.63 mg at 03/24/18 0727  . levalbuterol (XOPENEX) nebulizer solution 0.63 mg  0.63 mg Nebulization Q2H PRN Eugenie Filler, MD      . levofloxacin [facility]) tablet 500 mg  500 mg Oral Daily Eugenie Filler, MD   500 mg at 03/23/18 0836  . lisinopril (PRINIVIL,ZESTRIL) tablet 20 mg  20 mg Oral Daily Eugenie Filler, MD      . multivitamin with minerals tablet 1 tablet  1 tablet Oral Daily Shela Leff, MD   1 tablet at 03/23/18 0836  . oxyCODONE-acetaminophen (PERCOCET/ROXICET) 5-325 MG per tablet 1 tablet  1 tablet Oral Q8H PRN Eugenie Filler, MD   1 tablet at 03/23/18 2232  . predniSONE (DELTASONE) tablet 40 mg  40 mg Oral QAC breakfast Eugenie Filler, MD        Review of Systems  Constitutional: positive for fatigue Eyes: negative Ears, nose, mouth, throat, and face: negative Respiratory: positive for cough, dyspnea on exertion and sputum Cardiovascular: negative Gastrointestinal: negative Genitourinary:negative Integument/breast: negative Hematologic/lymphatic: negative Musculoskeletal:negative Neurological: negative Behavioral/Psych: negative Endocrine: negative Allergic/Immunologic: negative  Physical Exam  ZOX:WRUEA, healthy, no distress and anxious SKIN: skin color, texture, turgor are normal, no rashes or significant lesions HEAD: Normocephalic, No masses, lesions, tenderness or abnormalities EYES: normal, PERRLA, Conjunctiva  are pink and non-injected EARS: External ears normal, Canals clear OROPHARYNX:no exudate, no erythema and lips, buccal mucosa, and tongue normal  NECK: supple, no adenopathy LYMPH:  no palpable lymphadenopathy, no hepatosplenomegaly BREAST:not examined LUNGS: decreased breath sounds, expiratory wheezes bilaterally HEART: regular rate & rhythm and no murmurs ABDOMEN:abdomen soft, non-tender, normal bowel sounds and no masses or organomegaly BACK: Back symmetric, no curvature., No CVA tenderness, collaterals vein on the back. EXTREMITIES:no joint deformities, effusion, or inflammation, no edema  NEURO: alert & oriented x 3 with fluent speech, no focal motor/sensory deficits  PERFORMANCE STATUS: ECOG 1  LABORATORY DATA: Lab Results  Component Value Date   WBC 12.8 (H) 03/22/2018   HGB 9.5 (L) 03/22/2018   HCT 29.2 (L) 03/22/2018   MCV 93.0 03/22/2018   PLT 384 03/22/2018    @LASTCHEM @  RADIOGRAPHIC STUDIES: Dg Chest 2 View  Result Date: 03/17/2018 CLINICAL DATA:  Shortness of breath EXAM: CHEST - 2 VIEW COMPARISON:  Chest CT 12/04/2017 FINDINGS: Consolidation of the right upper lobe consistent with known upper lobe mass and likely postobstructive atelectasis. No pleural  effusion. Heart size normal. The left lung is clear. IMPRESSION: Known right suprahilar mass with associated upper lobe atelectasis. Lungs are otherwise clear. Electronically Signed   By: Ulyses Jarred M.D.   On: 03/17/2018 18:01   Ct Angio Chest Pe W And/or Wo Contrast  Result Date: 03/17/2018 CLINICAL DATA:  Shortness of breath and back pain. Known right lung mass. EXAM: CT ANGIOGRAPHY CHEST CT ABDOMEN AND PELVIS WITH CONTRAST TECHNIQUE: Multidetector CT imaging of the chest was performed using the standard protocol during bolus administration of intravenous contrast. Multiplanar CT image reconstructions and MIPs were obtained to evaluate the vascular anatomy. Multidetector CT imaging of the abdomen and pelvis was  performed using the standard protocol during bolus administration of intravenous contrast. CONTRAST:  173mL ISOVUE-370 IOPAMIDOL (ISOVUE-370) INJECTION 76% COMPARISON:  Chest CT 12/04/2017 FINDINGS: CTA CHEST FINDINGS Cardiovascular: Contrast injection is sufficient to demonstrate satisfactory opacification of the pulmonary arteries to the segmental level. There is no pulmonary embolus. The main pulmonary artery is within normal limits for size. 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 The inter lobar artery is now also occluded. There is calcific aortic atherosclerosis. No dissection. There is a normal variant aortic arch branching pattern with the brachiocephalic and left common carotid arteries sharing a common origin. Heart size is normal, without pericardial effusion. There are large paraspinal venous collaterals. Mediastinum/Nodes: Right-sided mediastinal fat is effaced by the low right suprahilar mass. Multiple subcentimeter bilateral axillary lymph nodes. Lungs/Pleura: There is debris within the right mainstem bronchus and bronchus intermedius. Right upper lobe bronchus is occluded. There is a large suprahilar right lung mass that appears unchanged in size. The mass is difficult to separate from the surrounding lung. 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 Increased confluent nodularity in the left lower lobe. Clustered nodules in the left upper lobe have increased in size, measuring up to 12 mm. Musculoskeletal: No chest wall abnormality. No acute or significant osseous findings. Review of the MIP images confirms the above findings. CT ABDOMEN and PELVIS FINDINGS Hepatobiliary: Normal hepatic contours and density. No visible biliary dilatation. Normal gallbladder. Pancreas: Normal contours without ductal dilatation. No peripancreatic fluid collection. Spleen: Normal. Adrenals/Urinary Tract: Normal  adrenal glands. 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 Stomach/Bowel: Matted bowel in the lower abdomen and pelvis. No dilatation or inflammation visualized. Vascular/Lymphatic: Atherosclerotic calcification is present within the non-aneurysmal abdominal aorta, without hemodynamically significant stenosis. Major abdominal veins are patent. No abdominal or pelvic lymphadenopathy. Reproductive: There are multiple enhancing nodules within the posterior left pelvis. Difficult to accurately localize these lesions due to crowding pelvic structures, but these may be fibroids within the uterine wall. Musculoskeletal. No bony spinal canal stenosis or focal osseous abnormality. Other: None. IMPRESSION: 1. 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 2. Debris within the right mainstem bronchus. Right upper lobe atelectasis. 3. No acute abnormality of the abdomen or pelvis. Matted appearance of the small bowel and colon without dilatation or inflammation. 4. Enhancing nodular foci along the posterior left pelvis. Exact location is not entirely clear, but these are suspected to be subserosal fibroids of the left uterine fundus. Aortic Atherosclerosis (ICD10-I70.0). Electronically Signed   By: Ulyses Jarred M.D.   On: 03/17/2018  19:14   Mr Brain Wo Contrast  Result Date: 03/18/2018 CLINICAL DATA:  Status post lung mass biopsy, lung cancer. EXAM: MRI HEAD WITHOUT CONTRAST TECHNIQUE: Multiplanar, multiecho pulse sequences of the brain and surrounding structures were obtained without intravenous contrast. Due to cough and motion, patient was unable to tolerate further imaging and postcontrast images not obtained. COMPARISON:  CT HEAD June 13, 2015 FINDINGS: Variable  motion degraded examination, severely motion degraded axial MPGR and axial T1. INTRACRANIAL CONTENTS: No reduced diffusion to suggest acute ischemia or hypercellular tumor. No susceptibility artifact to suggest hemorrhage though sequence is severely motion degraded. The ventricles and sulci are normal for patient's age. Patchy supratentorial and pontine white matter FLAIR T2 hyperintensities. No suspicious parenchymal signal, masses, mass effect. No abnormal extra-axial fluid collections. No extra-axial masses. VASCULAR: Normal major intracranial vascular flow voids present at skull base. SKULL AND UPPER CERVICAL SPINE: No abnormal sellar expansion. No suspicious calvarial bone marrow signal. Craniocervical junction maintained. SINUSES/ORBITS: Mild sphenoid sinus mucosal thickening. Mastoid air cells are well aerated. Included ocular globes and orbital contents are non-suspicious. OTHER: None. IMPRESSION: 1. Motion degraded examination without acute intracranial process. No identified intracranial metastasis though postcontrast sequences would be more sensitive. 2. Mild-to-moderate chronic small vessel ischemic changes. Electronically Signed   By: Elon Alas M.D.   On: 03/18/2018 20:55   Ct Abdomen Pelvis W Contrast  Result Date: 03/17/2018 CLINICAL DATA:  Shortness of breath and back pain. Known right lung mass. EXAM: CT ANGIOGRAPHY CHEST CT ABDOMEN AND PELVIS WITH CONTRAST TECHNIQUE: Multidetector CT imaging of the chest was performed using the standard protocol during bolus administration of intravenous contrast. Multiplanar CT image reconstructions and MIPs were obtained to evaluate the vascular anatomy. Multidetector CT imaging of the abdomen and pelvis was performed using the standard protocol during bolus administration of intravenous contrast. CONTRAST:  153mL ISOVUE-370 IOPAMIDOL (ISOVUE-370) INJECTION 76% COMPARISON:  Chest CT 12/04/2017 FINDINGS: CTA CHEST FINDINGS Cardiovascular: Contrast  injection is sufficient to demonstrate satisfactory opacification of the pulmonary arteries to the segmental level. There is no pulmonary embolus. The main pulmonary artery is within normal limits for size. 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 The inter lobar artery is now also occluded. There is calcific aortic atherosclerosis. No dissection. There is a normal variant aortic arch branching pattern with the brachiocephalic and left common carotid arteries sharing a common origin. Heart size is normal, without pericardial effusion. There are large paraspinal venous collaterals. Mediastinum/Nodes: Right-sided mediastinal fat is effaced by the low right suprahilar mass. Multiple subcentimeter bilateral axillary lymph nodes. Lungs/Pleura: There is debris within the right mainstem bronchus and bronchus intermedius. Right upper lobe bronchus is occluded. There is a large suprahilar right lung mass that appears unchanged in size. The mass is difficult to separate from the surrounding lung. 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 Increased confluent nodularity in the left lower lobe. Clustered nodules in the left upper lobe have increased in size, measuring up to 12 mm. Musculoskeletal: No chest wall abnormality. No acute or significant osseous findings. Review of the MIP images confirms the above findings. CT ABDOMEN and PELVIS FINDINGS Hepatobiliary: Normal hepatic contours and density. No visible biliary dilatation. Normal gallbladder. Pancreas: Normal contours without ductal dilatation. No peripancreatic fluid collection. Spleen: Normal. Adrenals/Urinary Tract: Normal adrenal glands. 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 Stomach/Bowel: Matted bowel in the lower abdomen and pelvis. No dilatation or inflammation visualized.  Vascular/Lymphatic: Atherosclerotic calcification is present within the  non-aneurysmal abdominal aorta, without hemodynamically significant stenosis. Major abdominal veins are patent. No abdominal or pelvic lymphadenopathy. Reproductive: There are multiple enhancing nodules within the posterior left pelvis. Difficult to accurately localize these lesions due to crowding pelvic structures, but these may be fibroids within the uterine wall. Musculoskeletal. No bony spinal canal stenosis or focal osseous abnormality. Other: None. IMPRESSION: 1. 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 2. Debris within the right mainstem bronchus. Right upper lobe atelectasis. 3. No acute abnormality of the abdomen or pelvis. Matted appearance of the small bowel and colon without dilatation or inflammation. 4. Enhancing nodular foci along the posterior left pelvis. Exact location is not entirely clear, but these are suspected to be subserosal fibroids of the left uterine fundus. Aortic Atherosclerosis (ICD10-I70.0). Electronically Signed   By: Ulyses Jarred M.D.   On: 03/17/2018 19:14   Ct Biopsy  Result Date: 03/19/2018 INDICATION: 61 year old female with a history right upper lobe tumor. She has been referred for biopsy. EXAM: CT BIOPSY MEDICATIONS: None. ANESTHESIA/SEDATION: Moderate (conscious) sedation was employed during this procedure. A total of Versed 1.0 mg and Fentanyl 50 mcg was administered intravenously. Moderate Sedation Time: 10 minutes. The patient's level of consciousness and vital signs were monitored continuously by radiology nursing throughout the procedure under my direct supervision. FLUOROSCOPY TIME:  CT COMPLICATIONS: None PROCEDURE: The procedure, risks, benefits, and alternatives were explained to the patient and the patient's  family. Specific risks that were addressed included bleeding, infection, pneumothorax, need for further procedure including chest tube placement, chance of delayed pneumothorax or hemorrhage, hemoptysis, nondiagnostic sample, cardiopulmonary collapse, death. Questions regarding the procedure were encouraged and answered. The patient understands and consents to the procedure. Patient was positioned in the supine position on the CT gantry table and a scout CT of the chest was performed for planning purposes. Once angle of approach was determined, the skin and subcutaneous tissues this scan was prepped and draped in the usual sterile fashion, and a sterile drape was applied covering the operative field. A sterile gown and sterile gloves were used for the procedure. Local anesthesia was provided with 1% Lidocaine. The skin and subcutaneous tissues were infiltrated 1% lidocaine for local anesthesia, and a small stab incision was made with an 11 blade scalpel. Using CT guidance, a 17 gauge trocar needle was advanced into the safest aspect of the right upper lobetarget. After confirmation of the tip, the stylet was withdrawn. Immediately serous fluid returned, tidling with the breathing. A single 18 gauge core biopsy was attempted with no tissue. At this time we withdrew from the case and removed the needle. Patient tolerated the procedure well and remained hemodynamically stable throughout. No complications were encountered and no significant blood loss was encounter IMPRESSION: Status post CT-guided attempt at right upper lobe mass biopsy. The central portion of the tumor was avoided given the high risk, and the peripheral aspect of the tumor was avoided given the high likelihood of atelectatic/postobstructive lung. In the absence of a correlative PET-CT, the needle was placed in the central/safest portion of the tumor, with discovery of frankly serous fluid/cyst. No sample was achieved. Signed, Dulcy Fanny. Dellia Nims, [place]  [facility] Electronically Signed   By: York Cerise  Earleen Newport D.O.   On: 03/19/2018 08:13   Dg Chest Port 1 View  Result Date: 03/18/2018 CLINICAL DATA:  Productive cough with clear mucus. Smoker since age 58. Lung mass. EXAM: PORTABLE CHEST 1 VIEW COMPARISON:  03/17/2018 CT and CXR FINDINGS: Unchanged pulmonary mass occupying much of the right upper thorax. Emphysematous hyperinflation of. Heart and mediastinal contours to the extent visualized appear stable. No aggressive osseous lesions. IMPRESSION: Redemonstration of right upper pulmonary mass in the setting of COPD. No significant change. Electronically Signed   By: Ashley Royalty M.D.   On: 03/18/2018 20:44    ASSESSMENT: This is a very pleasant 62 years old African-American female with highly suspicious lung cancer presented with large right upper lobe/suprahilar mass with obstruction of the right upper lobe bronchus as well as the right middle lobe bronchus and compression of the right upper and middle lobe pulmonary arteries and SVC.   PLAN: I had a lengthy discussion with the patient today about her current condition and further investigation to confirm her diagnosis. The patient will need a PET scan but unfortunately this cannot be done except on outpatient basis after discharge. I recommended for the patient to see a pulmonologist for consideration of bronchoscopy with endobronchial ultrasound and biopsies for confirmation of her tissue diagnosis since the CT-guided core biopsy was not conclusive for malignancy.  I discussed her case with Dr. Valeta Harms and he will arrange for pulmonary to do the bronchoscopy for tissue diagnosis. Once the diagnosis is established, the patient will need evaluation by radiation oncology for consideration of palliative radiotherapy to the obstructive right upper lobe lung mass. I strongly encouraged the patient to quit smoking. I will arrange for the patient to  have a follow-up appointment with me at the cancer center within the next few days for more detailed discussion of her condition and treatment options after discharge from the hospital.  The patient voices understanding of current disease status and treatment options and is in agreement with the current care plan.  All questions were answered. The patient knows to call the clinic with any problems, questions or concerns. We can certainly see the patient much sooner if necessary.  Thank you so much for allowing me to participate in the care of Sonya [name]. I will continue to follow up the patient with you and assist in her care.  Disclaimer: This note was dictated with voice recognition software. Similar sounding words can inadvertently be transcribed and may not be corrected upon review.   Eilleen Kempf March 24, 2018, 8:29 AM

## 2018-03-24 NOTE — Progress Notes (Signed)
Bilateral upper extremity duplex completed:   Right:Findings consistent with acute deep vein thrombosis involving the right internal jugular veins, right subclavian veins, right axillary vein and right brachial veins.   Left:Findings consistent with acute deep vein thrombosis involving the left internal jugular veins and left brachial veins.   Abram Sander 03/24/2018 11:58 AM

## 2018-03-24 NOTE — Progress Notes (Signed)
PROGRESS NOTE    Christin Moline  XHB:[id number] DOB: 02-11-1956 DOA: 03/17/2018 PCP: Default, Provider, MD   Brief Narrative:  Sonya [name] is a 62 y.o. female with medical history significant of right lung mass presenting to the hospital for evaluation of shortness of breath. 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  Patient was offered admission for further work-up at that time but she declined it.  Plan was for her to follow-up with her primary care provider. Patient states she did not follow-up with a doctor since her ED discharge in July.  She continued to smoke cigarettes and quit a week ago; history of smoking 1 pack/day for 37 years.  States that she has been feeling short of breath with exertion and when lying down for the past 2 weeks.  Also having a cough productive of white-colored sputum.  Symptoms have been getting progressively worse.  Denies having any hemoptysis.  She has unintentionally lost 5 to 6 pounds since July.  Denies having any chest pain.  Denies having any hematemesis, melena, or hematochezia.  ED Course: Afebrile.  Tachycardic initially but now pulse normal.  Continues to be tachypneic.  Not hypotensive.  Satting well on room air.  No leukocytosis.  Corrected calcium 12.3.  I-STAT troponin negative.  EKG not suggestive of ACS.  Chest x-ray showing known right suprahilar mass with associated upper lobe atelectasis.  CTA chest showing no pulmonary embolus, however, increased vascular invasion and/or mass-effect by the right suprahilar mass, resulting in new occlusion of the right middle lobe pulmonary artery and persistent right upper lobe pulmonary artery occlusion as well as occlusion/invasion of the superior vena cava.  Debris within the right mainstem bronchus.  Right upper lobe atelectasis.  CT  abdomen pelvis showing no acute abnormality.  ED physician discussed the case with Dr. Jana Hakim from oncology who recommended starting the patient on IV heparin and consulting IR in the morning for biopsy. TRH paged to admit.   Oncology was consulted and awaiting evaluation.  IR was consulted CT biopsy was attempted however tissue was not able to be obtained as serous fluid was what was collected.  Pulmonary was curb sided however felt patient was not a candidate at this time for bronchoscopy due to findings on CT scan and concern of puncturing pulmonary artery bronchoscopy was undertaken. Patient subsequently noted to be in acute COPD exacerbation placed on steroids, nebulizer treatments, antibiotics. On 03/20/2018 patient noted to go into new onset atrial fibrillation.    Assessment & Plan:   Principal Problem:   Pulmonary mass Active Problems:   Acute deep vein thrombosis (DVT) of upper extremity (HCC)   Chronic anemia   Hypercalcemia   Hypertensive urgency   HTN (hypertension)   Lung mass   Occlusion of pulmonary artery (HCC)   COPD with acute exacerbation (HCC)   Tachycardia   Atrial fibrillation with RVR ([place])   New onset atrial fibrillation (HCC)  #1 pulmonary mass occluding/invading right pulmonary artery and SVC/lung mass with SVC Patient presented to the ED with 2-week history of worsening dyspnea and cough in addition to on intentional weight loss.  Patient seen in the ED December 04, 2017 CT chest at that time revealed large right upper lobe/suprahilar mass involving right upper lobe bronchus, right upper lobe pulmonary artery and SVC.  Patient refused admission at  that time and was subsequently lost to follow-up.  Patient has not seen a PCP in over 1 to 2 years.  Patient with ongoing tobacco abuse however states quit 1 week ago.  CT angiogram chest done showed increasing vascular invasion and no mass-effect of the right suprahilar mass, resulting in new occlusion of right middle  lobe pulmonary artery and persistent right upper lobe pulmonary artery occlusion as well as occlusion/invasion of the SVC.  Debris noted in the right mainstem bronchus.  Right upper lobe atelectasis.  Case was discussed with pulmonary who looked at the CT findings and did not feel a bronchoscopy at this time will be of any benefit and increased risk, due to external compression from mass.  IR has been consulted to evaluate for possible biopsy.  IR  Was concerned that may not get a good sample as may likely have some necrosis however CT-guided biopsy was done on 03/18/2018, which showed a large cystic area with frank serous fluid returned and no tissue obtained.  IR recommended PET/CT to determine best target versus bronchoscopy as an alternative next step.  Discussed again with pulmonary who feel concerned that bronchoscopy may lead to incidental laceration of pulmonary artery and feel currently risks right now bronchoscopy are high and recommending alternative of PET/CT as recommended per interventional radiology.  Oncology consultation pending.  Continue Mucinex.  Continue prednisone, scheduled nebs, Pulmicort.  Discussed case with Dr. Lorna Few of oncology who has graciously agreed to see patient for further evaluation and management in the outpatient setting on Friday, 03/27/2018.  Patient has been seen in consultation by oncology Dr. Lorna Few today 03/24/2018.  Patient noted now to have bilateral upper extremity clots likely secondary to hypercoagulable state and patient started empirically on IV heparin and likely to transition to full dose Lovenox when close to discharge.  Oncology following and appreciate input and recommendations.   2.  Bilateral upper extremity swelling secondary to bilateral upper extremity acute DVTs. Secondary to bilateral upper extremity acute DVTs noted on upper extremity Dopplers.  Initially felt likely secondary to volume overload from IV fluids being used to  manage hypercalcemia.  Saline lock IV fluids.  Patient received IV diuretics.  Patient to receive a dose of Lasix 40 mg IV x1 today.  Will place on heparin drip.  Could likely transition to full dose Lovenox when getting close to discharge.  Discussed with oncology.     3.  New onset atrial fibrillation CHA2DSVASC = 2 Patient noted to have heart rates sustained in the 150s the morning of 03/20/2018.  Patient noted on exam to have a cardiac exam which was irregularly irregular.  New onset A. fib likely secondary to lung mass.   Patient converted back to normal sinus rhythm afternoon of 03/20/2018.  Cardiac enzymes negative x2.  Magnesium of 1.7 and will replete.  TSH within normal limits at 1.354.  2D echo with normal left ventricular size with mild LVH.  EF 60 to 65%.  No significant valvular abnormalities.  Patient was started on a Cardizem drip and as patient converted to normal sinus rhythm patient was transitioned to oral Cardizem 30 mg every 6 hours per cardiology.  Patient seen in consultation by cardiology and Cardizem has been changed to Cardizem CD 120 mg daily.  Per cardiology as patient has a need for possible repeat biopsy will hold off on anticoagulation at this time.  If patient is to have a recurrence of A. fib may consider Eliquis.  Discontinue aspirin.  Patient now on treatment dose IV heparin secondary to bilateral upper extremity DVTs.  Outpatient follow-up with cardiology.    4.  Probable COPD exacerbation Patient with poor air movement.  Shortness of breath on minimal exertion, expiratory wheezing noted on examination on 03/19/2018.  Clinical improvement after patient being started on Pulmicort, scheduled duo nebs, IV Solu-Medrol, Levaquin.  Continue Mucinex.  Continue Tussionex as needed.  IV Solu-Medrol has been transitioned to oral prednisone taper.  Patient status post 5 days of Levaquin. Follow.   5.  Chronic anemia Patient denies any overt bleeding.  No hemoptysis.  No hematemesis.   No melena.  No hematochezia.  Anemia panel with iron level of 14, TIBC decreased at 213.  Ferritin of 103.  Hemoglobin currently at 9.5 from 8.7 from 8.9.  Follow H&H.  Transfusion threshold hemoglobin less than 7.  Oncology following..  6.  Hypertensive urgency Patient noted to have systolic blood pressures in the 190s and 180s early on during the hospitalization.  Blood pressure improved on Norvasc and lisinopril. Is noted on review of the chart that patient has had a history of hypertension was supposed to be on antihypertensive medications.  Patient states has not been on antihypertensive medications in over a year and has not seen a PCP since then.  Patient on IV Lasix now and as such lisinopril was discontinued.  Patient started on Cardizem due to new onset A. fib.  Norvasc has been discontinued.  Increased lisinopril to 20 mg daily and uptitrate as needed for better blood pressure control.  Hydralazine as needed.  Will need outpatient follow-up.   7.  Hypercalcemia Corrected calcium was 12.3 on admission.  Concern for underlying malignancy.  Patient asymptomatic.  Phosphorus level 2.2.  Magnesium was 1.7.  Ionized calcium 6.1.  Vitamin D 25 hydroxy was 9.8.  PTH was low at 8.  Calcium levels trending down.  Corrected calcium level at 9.5.  Saline lock IV fluids.  Discontinued IV Lasix.  Urine output of 3 L over the past 24 hours.  Status post IV Zometa.  Continue calcitonin nasal spray.  Outpatient follow-up with oncology.     8.  Hypokalemia Likely secondary to diuretics.  Magnesium level at 2.5.  Potassium at 3.9 today.  Follow.    DVT prophylaxis: Heparin drip Code Status: Full Family Communication: Updated patient.  No family at bedside. Disposition Plan: Pending work-up.  Likely home with home health once medically stable in the next 24 to 48 hours.   Consultants:   Interventional radiology  Oncology: Dr. Lorna Few 03/24/2018  Cardiology: Dr. Angelena Form  03/20/2018  Procedures:   CT angiogram chest 03/17/2018  CT abdomen and pelvis 03/18/2018  Chest x-ray 03/17/2018  CT-guided biopsy right upper lobe mass--- large cystic area with frank serous fluid returned, no tissue obtained.  Per Dr. Earleen Newport IR 03/18/2018  Bilateral upper extremity Dopplers 03/24/2018  Antimicrobials:   Levaquin 03/19/2018>>>>>>> 03/24/2018   Subjective: Sleeping however easily arousable.  Still with a cough.  Denies any chest pain.  No hemoptysis.  Feels upper extremity slowly improving.  Objective: Vitals:   03/23/18 2049 03/24/18 0442 03/24/18 0727 03/24/18 0842  BP:  (!) 176/96  (!) 168/88  Pulse:  75    Resp:  18    Temp:  97.7 F (36.5 C)    TempSrc:  Oral    SpO2: 100% 100% 95%   Weight:      Height:        Intake/Output Summary (Last 24  hours) at 03/24/2018 1611 Last data filed at 03/24/2018 1100 Gross per 24 hour  Intake 284.78 ml  Output 2900 ml  Net -2615.22 ml   Filed Weights   03/17/18 1612 03/17/18 2131  Weight: 56.7 kg 47.2 kg    Examination:  General exam: NAD. Respiratory system: Fair air movement.  Scattered coarse breath sounds.  Minimal expiratory wheezing.  Less tight.  No use of accessory muscles of respiration.  Speaking in full sentences.  Cardiovascular system: RRR no murmurs rubs or gallops.  No JVD.  No lower extremity edema.   Gastrointestinal system: Abdomen is nontender, nondistended, soft, positive bowel sounds.  No rebound.  No guarding.  Central nervous system: Alert and oriented. No focal neurological deficits. Extremities: Bilateral upper extremity swelling with decreasing edema.  Symmetric 5 x 5 power. Skin: No rashes, lesions or ulcers Psychiatry: Judgement and insight appear normal. Mood & affect appropriate.     Data Reviewed: I have personally reviewed following labs and imaging studies  CBC: Recent Labs  Lab 03/18/18 0256 03/19/18 0533 03/20/18 0552 03/21/18 0437 03/22/18 0525  WBC 9.0 12.3*  13.7* 16.0* 12.8*  NEUTROABS  --   --  12.8* 15.2* 11.0*  HGB 8.6* 8.5* 8.9* 8.7* 9.5*  HCT 27.4* 26.9* 27.9* 26.9* 29.2*  MCV 95.5 95.4 94.3 94.1 93.0  PLT 310 306 292 338 614   Basic Metabolic Panel: Recent Labs  Lab 03/18/18 0826 03/18/18 1734 03/19/18 0533 03/20/18 0552 03/21/18 0437 03/22/18 0525 03/23/18 0521 03/24/18 1020  NA 132*  --  134* 131* 135 135 138 137  K 3.5  --  4.3 4.3 4.1 3.2* 3.7 3.9  CL 101  --  101 100 102 99 102 100  CO2 24  --  25 25 25 28 28 28   GLUCOSE 101*  --  105* 142* 198* 112* 93 99  BUN 8  --  7* 10 17 15 19 23   CREATININE 0.64  --  0.61 0.55 0.56 0.43* 0.57 0.59  CALCIUM 10.7*  10.9*  --  10.9* 11.1* 10.0 8.8* 8.4* 8.9  MG 1.7  --  2.1  --  1.7 2.5*  --   --   PHOS  --  2.2*  --  3.3 2.0*  --   --   --    GFR: Estimated Creatinine Clearance: 54.3 mL/min (by C-G formula based on SCr of 0.59 mg/dL). Liver Function Tests: Recent Labs  Lab 03/18/18 0826 03/19/18 0533 03/21/18 0437 03/24/18 1020  AST 11* 12* 18 21  ALT 8 8 11 17   ALKPHOS 64 61 60 52  BILITOT 0.5 0.8 0.2* 0.5  PROT 7.7 7.5 7.3 7.4  ALBUMIN 3.0* 3.0* 2.5* 2.7*   No results for input(s): LIPASE, AMYLASE in the last 168 hours. No results for input(s): AMMONIA in the last 168 hours. Coagulation Profile: Recent Labs  Lab 03/17/18 2006  INR 1.16   Cardiac Enzymes: Recent Labs  Lab 03/20/18 1206 03/20/18 2323  TROPONINI <0.03 <0.03   BNP (last 3 results) No results for input(s): PROBNP in the last 8760 hours. HbA1C: No results for input(s): HGBA1C in the last 72 hours. CBG: No results for input(s): GLUCAP in the last 168 hours. Lipid Profile: No results for input(s): CHOL, HDL, LDLCALC, TRIG, CHOLHDL, LDLDIRECT in the last 72 hours. Thyroid Function Tests: No results for input(s): TSH, T4TOTAL, FREET4, T3FREE, THYROIDAB in the last 72 hours. Anemia Panel: No results for input(s): VITAMINB12, FOLATE, FERRITIN, TIBC, IRON, RETICCTPCT in the last  72  hours. Sepsis Labs: No results for input(s): PROCALCITON, LATICACIDVEN in the last 168 hours.  No results found for this or any previous visit (from the past 240 hour(s)).       Radiology Studies: No results found.      Scheduled Meds: . budesonide (PULMICORT) nebulizer solution  0.5 mg Nebulization BID  . dextromethorphan-guaiFENesin  1 tablet Oral BID  . diltiazem  120 mg Oral Daily  . feeding supplement (ENSURE ENLIVE)  237 mL Oral BID BM  . ipratropium  0.5 mg Nebulization TID  . levalbuterol  0.63 mg Nebulization TID  . lisinopril  20 mg Oral Daily  . multivitamin with minerals  1 tablet Oral Daily  . predniSONE  40 mg Oral QAC breakfast   Continuous Infusions: . heparin 1,100 Units/hr (03/24/18 1403)     LOS: 6 days    Time spent: 40 minutes    Irine Seal, MD [facility] Pager [phone]  If 7PM-7AM, please contact night-coverage [email or web address] Password [email or web address] 03/24/2018, 4:11 PM

## 2018-03-24 NOTE — Progress Notes (Signed)
PT demonstrated hands on understanding of Flutter device. Productive cough at this time. 

## 2018-03-25 ENCOUNTER — Telehealth: Payer: Self-pay

## 2018-03-25 ENCOUNTER — Encounter (HOSPITAL_COMMUNITY): Payer: Self-pay | Admitting: Pulmonary Disease

## 2018-03-25 DIAGNOSIS — I871 Compression of vein: Secondary | ICD-10-CM

## 2018-03-25 DIAGNOSIS — I82A11 Acute embolism and thrombosis of right axillary vein: Secondary | ICD-10-CM

## 2018-03-25 DIAGNOSIS — I82A12 Acute embolism and thrombosis of left axillary vein: Secondary | ICD-10-CM

## 2018-03-25 DIAGNOSIS — D6869 Other thrombophilia: Secondary | ICD-10-CM

## 2018-03-25 LAB — CBC
HCT: 27.9 % — ABNORMAL LOW (ref 36.0–46.0)
Hemoglobin: 8.9 g/dL — ABNORMAL LOW (ref 12.0–15.0)
MCH: 30.1 pg (ref 26.0–34.0)
MCHC: 31.9 g/dL (ref 30.0–36.0)
MCV: 94.3 fL (ref 80.0–100.0)
NRBC: 0 % (ref 0.0–0.2)
PLATELETS: 416 10*3/uL — AB (ref 150–400)
RBC: 2.96 MIL/uL — ABNORMAL LOW (ref 3.87–5.11)
RDW: 14.2 % (ref 11.5–15.5)
WBC: 12 10*3/uL — ABNORMAL HIGH (ref 4.0–10.5)

## 2018-03-25 LAB — COMPREHENSIVE METABOLIC PANEL
ALT: 15 U/L (ref 0–44)
ANION GAP: 7 (ref 5–15)
AST: 17 U/L (ref 15–41)
Albumin: 2.9 g/dL — ABNORMAL LOW (ref 3.5–5.0)
Alkaline Phosphatase: 50 U/L (ref 38–126)
BUN: 26 mg/dL — ABNORMAL HIGH (ref 8–23)
CO2: 31 mmol/L (ref 22–32)
Calcium: 8.7 mg/dL — ABNORMAL LOW (ref 8.9–10.3)
Chloride: 100 mmol/L (ref 98–111)
Creatinine, Ser: 0.73 mg/dL (ref 0.44–1.00)
Glucose, Bld: 109 mg/dL — ABNORMAL HIGH (ref 70–99)
POTASSIUM: 3.8 mmol/L (ref 3.5–5.1)
Sodium: 138 mmol/L (ref 135–145)
TOTAL PROTEIN: 7.2 g/dL (ref 6.5–8.1)
Total Bilirubin: 0.5 mg/dL (ref 0.3–1.2)

## 2018-03-25 LAB — HEPARIN LEVEL (UNFRACTIONATED)
HEPARIN UNFRACTIONATED: 0.34 [IU]/mL (ref 0.30–0.70)
HEPARIN UNFRACTIONATED: 0.44 [IU]/mL (ref 0.30–0.70)

## 2018-03-25 MED ORDER — PREDNISONE 20 MG PO TABS
20.0000 mg | ORAL_TABLET | Freq: Every day | ORAL | Status: AC
  Administered 2018-03-26: 20 mg via ORAL
  Filled 2018-03-25: qty 1

## 2018-03-25 MED ORDER — PREDNISONE 5 MG PO TABS
30.0000 mg | ORAL_TABLET | Freq: Every day | ORAL | Status: DC
  Filled 2018-03-25: qty 1

## 2018-03-25 MED ORDER — NOREPINEPHRINE 4 MG/250ML-% IV SOLN: 2.0000 ug/min | INTRAVENOUS | Status: DC

## 2018-03-25 MED ORDER — SODIUM CHLORIDE 0.9 % IV SOLN: 250.0000 mL | INTRAVENOUS | Status: DC

## 2018-03-25 MED ORDER — HYDROCHLOROTHIAZIDE 12.5 MG PO CAPS
12.5000 mg | ORAL_CAPSULE | Freq: Every day | ORAL | Status: DC
  Administered 2018-03-25 – 2018-03-28 (×4): 12.5 mg via ORAL
  Filled 2018-03-25 (×4): qty 1

## 2018-03-25 NOTE — Progress Notes (Signed)
[facility] PROGRESS NOTE    Progress Note  Sonya [name]  BPZ:[id number] DOB: 1955/12/21 DOA: 03/17/2018 PCP: Default, Provider, MD     Brief Narrative:   Sonya [name] is an 62 y.o. female past medical history significant for right lung mass who presented to the hospital for shortness of breath on December 04, 2017 with a CT chest that revealed a large right suprahilar mass with involvement of the right upper lobe bronchus and SVC during this time she was offered admission and further work-up and she declined.  She did not follow-up with her primary care doctor when discharge in July she continued to smoke, she relates she comes back today to the hospital feeling short of breath on exertion and when laying down also having a productive cough with progressive symptoms repeated CTA does not show a PE does show increased vascular invasion mass-effect by the suprahilar mass resulting in new occlusion of the right middle lobe and persistent right upper lobe arterial occlusion as well as superior vena cava, oncology was consulted who recommended IR to perform a CT-guided biopsy which was unsuccessful.  Pulmonary and critical care was consulted for EBUS.,  He was also noted to be in COPD exacerbation and placed on steroids antibiotics and inhalers.  On 03/20/2018 she developed new onset atrial fibrillation.  Assessment/Plan:   Pulmonary mass/invading the right pulmonary artery, superior vena cava/superior vena cava syndrome.: Oncology was consulted and is on board. IR asked to evaluate for possible CT-guided biopsy done on 03/18/2018 which showed serous fluid. Pulmonary and critical care was consulted who felt bronchoscopy may lead to laceration of pulmonary artery and he was high risk. Re-consulted critical care for possible EBUS.has been consulted. However IR recommended a PET scan to better start the adverse coloscopy versus alternative therapy. 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  Malignant hypercalcemia: Treated with IV fluids, Zometa calcitonin and Lasix. His IV fluids and Lasix have been discontinued. Calcium is stable.  Bilateral upper extremity clot: She was started empirically on IV heparin.  New onset atrial fibrillation: Converted to sinus rhythm on 03/20/2018. CHADVASC score of 2 2D echo was done that showed an EF of 60% no valve abnormalities. Now on oral Cardizem every 6 hours. Cardiology was consulted who agree with management and he agreed that if the patient developed need to be on Eliquis as an outpatient for new bilateral upper extremity DVTs. Currently on IV heparin.  Probable COPD: Started empirically on inhaler steroids and antibiotics. Continue steroid taper.  Chronic anemia Anemia Panel with iron level, 14 a ferritin of 103.  Hypertensive urgency: On lisinopril and Cardizem. We will add low-dose hydrochlorothiazide.  Hypokalemia: Repleted orally.  Tinea monitor closely.  Leukocytosis: Likely due to steroids, she has remained afebrile white blood cell count is improving.   DVT prophylaxis: Heparin Family Communication:none Disposition Plan/Barrier to D/C: unable to determine Code Status:     Code Status Orders  (From admission, onward)         Start     Ordered   03/17/18 2059  Full code  Continuous     03/17/18 2100        Code Status History    This patient has a current code status but no historical code status.        IV Access:    Peripheral IV   Procedures and diagnostic studies:   Vas [place] Upper  Extremity Venous Duplex  Result Date: 03/24/2018 UPPER VENOUS STUDY  Indications: Swelling Performing Technologist: Abram Sander  Examination Guidelines: A complete evaluation includes B-mode imaging, spectral Doppler, color Doppler, and power Doppler as needed of all accessible portions of each vessel. Bilateral testing is considered  an integral part of a complete examination. Limited examinations for reoccurring indications may be performed as noted.  Right Findings: +----------+------------+----------+---------+-----------+-------+ RIGHT     CompressiblePropertiesPhasicitySpontaneousSummary +----------+------------+----------+---------+-----------+-------+ IJV           None                 No        No             +----------+------------+----------+---------+-----------+-------+ Subclavian    None                 No        No             +----------+------------+----------+---------+-----------+-------+ Axillary      None                 No        No             +----------+------------+----------+---------+-----------+-------+ Brachial    Partial                                         +----------+------------+----------+---------+-----------+-------+ Radial        Full                                          +----------+------------+----------+---------+-----------+-------+ Ulnar         Full                                          +----------+------------+----------+---------+-----------+-------+ Cephalic      Full                                          +----------+------------+----------+---------+-----------+-------+ Basilic       None                                          +----------+------------+----------+---------+-----------+-------+  Left Findings: +----------+------------+----------+---------+-----------+-------+ LEFT      CompressiblePropertiesPhasicitySpontaneousSummary +----------+------------+----------+---------+-----------+-------+ IJV           None                 No        No             +----------+------------+----------+---------+-----------+-------+ Subclavian    Full                 Yes       Yes            +----------+------------+----------+---------+-----------+-------+ Axillary                           Yes       Yes             +----------+------------+----------+---------+-----------+-------+  Brachial    Partial                                         +----------+------------+----------+---------+-----------+-------+ Radial        Full                                          +----------+------------+----------+---------+-----------+-------+ Ulnar         Full                                          +----------+------------+----------+---------+-----------+-------+ Cephalic      Full                                          +----------+------------+----------+---------+-----------+-------+ Basilic       Full                                          +----------+------------+----------+---------+-----------+-------+  Summary:  Right: Findings consistent with acute deep vein thrombosis involving the right internal jugular veins, right subclavian veins, right axillary vein and right brachial veins.  Left: Findings consistent with acute deep vein thrombosis involving the left internal jugular veins and left brachial veins.  *See table(s) above for measurements and observations.  Diagnosing physician: Harold Barban MD Electronically signed by Harold Barban MD on 03/24/2018 at 6:55:33 PM.    Final      Medical Consultants:    None.  Anti-Infectives:    Subjective:    Sonya [name] relates her breathing is unchanged she continues to cough.  Objective:    Vitals:   03/24/18 2030 03/24/18 2217 03/25/18 0515 03/25/18 0839  BP: (!) 171/116 (!) 168/83 (!) 152/92   Pulse: 85 80 61   Resp: 16  14   Temp: 97.9 F (36.6 C)  (!) 97.3 F (36.3 C)   TempSrc: Oral  Oral   SpO2: 98%  99% 98%  Weight:      Height:        Intake/Output Summary (Last 24 hours) at 03/25/2018 0843 Last data filed at 03/25/2018 0600 Gross per 24 hour  Intake 835.32 ml  Output 1600 ml  Net -764.68 ml   Filed Weights   03/17/18 1612 03/17/18 2131  Weight: 56.7 kg 47.2 kg    Exam: General  exam: In no acute distress. Respiratory system: Good air movement and clear to auscultation. Cardiovascular system: S1 & S2 heard, RRR.  You can appreciate all of her neck veins. Gastrointestinal system: Abdomen is nondistended, soft and nontender.  Central nervous system: Alert and oriented. No focal neurological deficits. Extremities: No pedal edema. Skin: No rashes, lesions or ulcers Psychiatry: Judgement and insight appear normal. Mood & affect appropriate.    Data Reviewed:    Labs: Basic Metabolic Panel: Recent Labs  Lab 03/18/18 1734  03/19/18 0533 03/20/18 0552 03/21/18 0437 03/22/18 0525 03/23/18 0521 03/24/18 1020 03/25/18 0037  NA  --    < > 134* 131* 135 135 138 137  138  K  --    < > 4.3 4.3 4.1 3.2* 3.7 3.9 3.8  CL  --    < > 101 100 102 99 102 100 100  CO2  --    < > 25 25 25 28 28 28 31   GLUCOSE  --    < > 105* 142* 198* 112* 93 99 109*  BUN  --    < > 7* 10 17 15 19 23  26*  CREATININE  --    < > 0.61 0.55 0.56 0.43* 0.57 0.59 0.73  CALCIUM  --    < > 10.9* 11.1* 10.0 8.8* 8.4* 8.9 8.7*  MG  --   --  2.1  --  1.7 2.5*  --   --   --   PHOS 2.2*  --   --  3.3 2.0*  --   --   --   --    < > = values in this interval not displayed.   GFR Estimated Creatinine Clearance: 54.3 mL/min (by C-G formula based on SCr of 0.73 mg/dL). Liver Function Tests: Recent Labs  Lab 03/19/18 0533 03/21/18 0437 03/24/18 1020 03/25/18 0037  AST 12* 18 21 17   ALT 8 11 17 15   ALKPHOS 61 60 52 50  BILITOT 0.8 0.2* 0.5 0.5  PROT 7.5 7.3 7.4 7.2  ALBUMIN 3.0* 2.5* 2.7* 2.9*   No results for input(s): LIPASE, AMYLASE in the last 168 hours. No results for input(s): AMMONIA in the last 168 hours. Coagulation profile No results for input(s): INR, PROTIME in the last 168 hours.  CBC: Recent Labs  Lab 03/20/18 0552 03/21/18 0437 03/22/18 0525 03/24/18 1838 03/25/18 0037  WBC 13.7* 16.0* 12.8* 14.1* 12.0*  NEUTROABS 12.8* 15.2* 11.0*  --   --   HGB 8.9* 8.7* 9.5* 9.3*  8.9*  HCT 27.9* 26.9* 29.2* 28.6* 27.9*  MCV 94.3 94.1 93.0 94.1 94.3  PLT 292 338 384 395 416*   Cardiac Enzymes: Recent Labs  Lab 03/20/18 1206 03/20/18 2323  TROPONINI <0.03 <0.03   BNP (last 3 results) No results for input(s): PROBNP in the last 8760 hours. CBG: No results for input(s): GLUCAP in the last 168 hours. D-Dimer: No results for input(s): DDIMER in the last 72 hours. Hgb A1c: No results for input(s): HGBA1C in the last 72 hours. Lipid Profile: No results for input(s): CHOL, HDL, LDLCALC, TRIG, CHOLHDL, LDLDIRECT in the last 72 hours. Thyroid function studies: No results for input(s): TSH, T4TOTAL, T3FREE, THYROIDAB in the last 72 hours.  Invalid input(s): FREET3 Anemia work up: No results for input(s): VITAMINB12, FOLATE, FERRITIN, TIBC, IRON, RETICCTPCT in the last 72 hours. Sepsis Labs: Recent Labs  Lab 03/21/18 0437 03/22/18 0525 03/24/18 1838 03/25/18 0037  WBC 16.0* 12.8* 14.1* 12.0*   Microbiology No results found for this or any previous visit (from the past 240 hour(s)).   Medications:   . budesonide (PULMICORT) nebulizer solution  0.5 mg Nebulization BID  . dextromethorphan-guaiFENesin  1 tablet Oral BID  . diltiazem  120 mg Oral Daily  . feeding supplement (ENSURE ENLIVE)  237 mL Oral BID BM  . ipratropium  0.5 mg Nebulization TID  . levalbuterol  0.63 mg Nebulization TID  . lisinopril  20 mg Oral Daily  . multivitamin with minerals  1 tablet Oral Daily  . predniSONE  40 mg Oral QAC breakfast   Continuous Infusions: . heparin 1,100 Units/hr (03/25/18 0709)      LOS: 7 days  Charlynne Cousins  [facility] Pager [phone]  *Please refer to [email or web address], password [email or web address] to get updated schedule on who will round on this patient, as hospitalists switch teams weekly. If 7PM-7AM, please contact night-coverage at [email or web address], password [email or web address] for any overnight needs.  03/25/2018, 8:43 AM

## 2018-03-25 NOTE — Progress Notes (Signed)
PT Cancellation Note  Patient Details Name: Ananda Caya MRN: [id number] DOB: [birth year]   Cancelled Treatment:    Reason Eval/Treat Not Completed: Medical issues which prohibited therapy. Pt with new dx of UE DVT on 03/24/18.  Per department protocol, will need to wait til 24 hours post initiation of Heparin.  Will check back after that point.   Galen Manila 03/25/2018, 11:07 AM

## 2018-03-25 NOTE — Progress Notes (Signed)
PT continues to utilize Flutter device- productive cough at this time.

## 2018-03-25 NOTE — Consult Note (Addendum)
NAME:  Sonya Sonya [name], MRN:  [id number], DOB:  17-Oct-1955, LOS: 7 ADMISSION DATE:  03/17/2018, CONSULTATION DATE: 03/25/2018 REFERRING MD:  Aileen Fass CHIEF COMPLAINT: Right upper lobe lung mass  Brief History   Ms. Sonya Sonya [name] is a 62 year old female active smoker with right lung mass (known since 12/04/2017) presents with 2-week history of nonproductive cough, shortness of breath and weight loss.  CTA with progressive large right upper lobe lung mass invading/compressing pulmonary vasculature.  CT-guided biopsy attempted however no tissue obtained. Pulmonary consulted for diagnostic bronchoscopy with EBUS.   Past Medical History  Right suprahilar lung mass, significant tobacco history, hypertension, atrial fibrillation Significant Hospital Events   10/30-CT-guided biopsy Consults: date of consult/date signed off & final recs:  03/25/18  Procedures (surgical and bedside):  N/A  Significant Diagnostic Tests:  N/A  Micro Data:  N/A  Antimicrobials:  N/A  Subjective:  Patient reports she has overall improved.  Dyspnea has improved and cough is now productive, which she feels has relieved her chest congestion.  Denies fevers, chills, chest pain, hemoptysis.  Objective   Blood pressure (!) 152/92, pulse 61, temperature (!) 97.3 F (36.3 C), temperature source Oral, resp. rate 14, height 5\' 9"  (1.753 m), weight 47.2 kg, SpO2 98 %.        Intake/Output Summary (Last 24 hours) at 03/25/2018 1405 Last data filed at 03/25/2018 1012 Gross per 24 hour  Intake 817.32 ml  Output 1100 ml  Net -282.68 ml   Filed Weights   03/17/18 1612 03/17/18 2131  Weight: 56.7 kg 47.2 kg    Examination: Gen: Sonya [name] appearing, no acute distress HENT: NCAT, OP clear, neck supple without masses, Mallampati III Eyes: PERRL, EOMi, no scleral icterus Lymph: no palpable cervical lymphadenopathy PULM: Decreased right upper lobe breath sounds, CTA B, no wheezing or rhonchi CV: RRR, no mgr,  no JVD GI: BS+, soft, nontender, no hsm Derm: no rash or skin breakdown MSK: normal bulk and tone Neuro: A&Ox4, CN II-XII intact Psyche: normal mood and affect  Imaging: I personally reviewed the following images and reports below:  MR Brain 03/18/18 No evidence of intracranial lesions  CT Angio 03/17/18 No PE Large right upper lobe lung mass Occlusion of right upper lobar pulmonary artery by suprahilar mass Right middle lobe pulmonary artery occlusion Right upper lobe bronchus occluded Occlusion of superior vena cava  Resolved Hospital Problem list     Assessment & Plan:  62 year old female with 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  Reviewed CT chest from 11/2017 with large right upper lobe/suprahilar mass involving right upper lobe bronchus and right upper lobe pulmonary artery.  CT on this admission reviewed and shows progression of this mass now involving invasion/compression of right middle lobe pulmonary artery.  Recommend diagnostic bronchoscopy with endobronchial ultrasound under general anesthesia.  Patient is also currently on anticoagulation; this will need to be held for the procedure.  I discussed with the patient the risks and benefits of bronchoscopy including bleeding, lung collapse, infection and respiratory/cardiac arrest.  We also discussed the risks of holding anticoagulation in the setting of acute venous thrombi.  After shared decision making, patient wishes to pursue diagnostic testing. Will contact OR for scheduling.  Plan: Consent obtained at bedside Will discuss and coordinate care with anesthesia. NPO at midnight Hold heparin 6 hours prior to procedure EBUS scheduled for tomorrow at 12 pm  Rodman Pickle, M.D. [facility] Pulmonary/Critical Care Medicine Pager: [phone] After hours pager: [phone]  Disposition / Summary of Today's  Plan 03/25/18   Plan for bronchoscopy at 12 PM on 11/7 Please hold heparin 6 hours prior to  procedure  Labs   CBC: Recent Labs  Lab 03/20/18 0552 03/21/18 0437 03/22/18 0525 03/24/18 1838 03/25/18 0037  WBC 13.7* 16.0* 12.8* 14.1* 12.0*  NEUTROABS 12.8* 15.2* 11.0*  --   --   HGB 8.9* 8.7* 9.5* 9.3* 8.9*  HCT 27.9* 26.9* 29.2* 28.6* 27.9*  MCV 94.3 94.1 93.0 94.1 94.3  PLT 292 338 384 395 416*    Basic Metabolic Panel: Recent Labs  Lab 03/18/18 1734  03/19/18 0533 03/20/18 0552 03/21/18 0437 03/22/18 0525 03/23/18 0521 03/24/18 1020 03/25/18 0037  NA  --    < > 134* 131* 135 135 138 137 138  K  --    < > 4.3 4.3 4.1 3.2* 3.7 3.9 3.8  CL  --    < > 101 100 102 99 102 100 100  CO2  --    < > 25 25 25 28 28 28 31   GLUCOSE  --    < > 105* 142* 198* 112* 93 99 109*  BUN  --    < > 7* 10 17 15 19 23  26*  CREATININE  --    < > 0.61 0.55 0.56 0.43* 0.57 0.59 0.73  CALCIUM  --    < > 10.9* 11.1* 10.0 8.8* 8.4* 8.9 8.7*  MG  --   --  2.1  --  1.7 2.5*  --   --   --   PHOS 2.2*  --   --  3.3 2.0*  --   --   --   --    < > = values in this interval not displayed.   GFR: Estimated Creatinine Clearance: 54.3 mL/min (by C-G formula based on SCr of 0.73 mg/dL). Recent Labs  Lab 03/21/18 0437 03/22/18 0525 03/24/18 1838 03/25/18 0037  WBC 16.0* 12.8* 14.1* 12.0*    Liver Function Tests: Recent Labs  Lab 03/19/18 0533 03/21/18 0437 03/24/18 1020 03/25/18 0037  AST 12* 18 21 17   ALT 8 11 17 15   ALKPHOS 61 60 52 50  BILITOT 0.8 0.2* 0.5 0.5  PROT 7.5 7.3 7.4 7.2  ALBUMIN 3.0* 2.5* 2.7* 2.9*   No results for input(s): LIPASE, AMYLASE in the last 168 hours. No results for input(s): AMMONIA in the last 168 hours.  ABG    Component Value Date/Time   TCO2 26 06/13/2015 1600     Coagulation Profile: No results for input(s): INR, PROTIME in the last 168 hours.  Cardiac Enzymes: Recent Labs  Lab 03/20/18 1206 03/20/18 2323  TROPONINI <0.03 <0.03    HbA1C: No results found for: HGBA1C  CBG: No results for input(s): GLUCAP in the last 168  hours.  Admitting History of Present Illness.   Ms. Sonya Sonya [name] is a 62 year old female active smoker with right lung mass (known since 12/04/2017) who presented with shortness of breath and nonproductive cough, weight loss and decreased appetite.  Two weeks prior to admission she had worsening shortness of breath with exertion associated with nonproductive cough and fatigue.  Antibiotics and rest improve her symptoms.  Exertion worsens her symptoms.  Denies fevers, chills, night sweats.  Reports progressive fatigue, unintentional weight loss of 10 pounds since July.  Denies hemoptysis.  On prior admission in July/2019, she was incidentally found on CT with large right upper lobe/suprahilar mass involving right upper lobe bronchus and right upper lobe pulmonary artery.  CT  on this admission shows progression of this mass now involving invasion/compression of right middle lobe pulmonary artery.  Oncology was consulted by primary team.  She underwent CT-guided biopsy on 10/30 with no immediate complications however unable to obtain tissue.  Hospital course includes for hypertensive urgency, COPD exacerbation treated with steroids and Levaquin, new onset atrial fibrillation with RVR requiring anticoagulation and acute DVT involving the right internal jugular veins, and acute bilateral internal jugular veins, bilateral brachial veins, right subclavian and right axillary vein.  Tobacco use: 37-pack-year history  Review of Systems:   Review of Systems  Constitutional: Positive for malaise/fatigue and weight loss. Negative for chills, diaphoresis and fever.  HENT: Positive for congestion. Negative for sinus pain and sore throat.   Eyes: Negative for blurred vision.  Respiratory: Positive for cough, sputum production and shortness of breath. Negative for hemoptysis and wheezing.   Cardiovascular: Positive for orthopnea and PND. Negative for chest pain and leg swelling.  Gastrointestinal: Negative  for abdominal pain and nausea.  Genitourinary: Negative for frequency.  Musculoskeletal: Negative for myalgias.  Skin: Negative for rash.  Neurological: Negative for dizziness, loss of consciousness, weakness and headaches.  Endo/Heme/Allergies: Does not bruise/bleed easily.  All other systems reviewed and are negative.    Past Medical History  She,  has a past medical history of Atrial fibrillation ([place]), Bronchitis, Chronic back pain, COPD (chronic obstructive pulmonary disease) ([place]), HTN (hypertension), Mass of upper lobe of right lung, and Scoliosis.   Surgical History   History reviewed. No pertinent surgical history.   Social History   Social History   Socioeconomic History  . Marital status: Divorced    Spouse name: Not on file  . Number of children: Not on file  . Years of education: Not on file  . Highest education level: Not on file  Occupational History  . Not on file  Social Needs  . Financial resource strain: Not on file  . Food insecurity:    Worry: Not on file    Inability: Not on file  . Transportation needs:    Medical: Not on file    Non-medical: Not on file  Tobacco Use  . Smoking status: Current Every Day Smoker    Packs/day: 0.25  . Smokeless tobacco: Never Used  Substance and Sexual Activity  . Alcohol use: Not Currently  . Drug use: No  . Sexual activity: Not on file  Lifestyle  . Physical activity:    Days per week: Not on file    Minutes per session: Not on file  . Stress: Not on file  Relationships  . Social connections:    Talks on phone: Not on file    Gets together: Not on file    Attends religious service: Not on file    Active member of club or organization: Not on file    Attends meetings of clubs or organizations: Not on file    Relationship status: Not on file  . Intimate partner violence:    Fear of current or ex partner: Not on file    Emotionally abused: Not on file    Physically abused: Not on file    Forced sexual  activity: Not on file  Other Topics Concern  . Not on file  Social History Narrative  . Not on file  ,  reports that she has been smoking. She has been smoking about 0.25 packs per day. She has never used smokeless tobacco. She reports that she drank alcohol. She reports  that she does not use drugs.   Family History   Her family history includes Heart disease in her brother.   Allergies No Known Allergies   Home Medications  Prior to Admission medications   Medication Sig Start Date End Date Taking? Authorizing Provider  ibuprofen (ADVIL,MOTRIN) 200 MG tablet Take 200-400 mg by mouth every 6 (six) hours as needed for headache or moderate pain.   Yes [provider]  Multiple Vitamin (MULTIVITAMIN WITH MINERALS) TABS tablet Take 1 tablet by mouth daily.   Yes [provider]  oxyCODONE-acetaminophen (PERCOCET/ROXICET) 5-325 MG tablet Take 1 tablet by mouth every 8 (eight) hours as needed for severe pain. Patient not taking: Reported on 03/17/2018 12/04/17   Joanne Gavel, PA-C

## 2018-03-25 NOTE — Telephone Encounter (Signed)
PCP Appointment at [facility] for 03/27/18 cancelled at the request of Dessa Phi, RN CM.  AVS updated with information for the patient to call to re- schedule appointment.

## 2018-03-25 NOTE — Progress Notes (Signed)
ANTICOAGULATION CONSULT NOTE   Pharmacy Consult for heparin Indication: acute bilateral UE DVT  No Known Allergies  Patient Measurements: Height: 5\' 9"  (175.3 cm) Weight: 104 lb (47.2 kg) IBW/kg (Calculated) : 66.2 Heparin Dosing Weight: 47 kg  Vital Signs: Temp: 97.3 F (36.3 C) (11/06 0515) Temp Source: Oral (11/06 0515) BP: 152/92 (11/06 0515) Pulse Rate: 61 (11/06 0515)  Labs: Recent Labs    03/23/18 0521 03/24/18 1020 03/24/18 1838 03/25/18 0037 03/25/18 0904  HGB  --   --  9.3* 8.9*  --   HCT  --   --  28.6* 27.9*  --   PLT  --   --  395 416*  --   HEPARINUNFRC  --   --  0.54 0.44 0.34  CREATININE 0.57 0.59  --  0.73  --     Estimated Creatinine Clearance: 54.3 mL/min (by C-G formula based on SCr of 0.73 mg/dL).  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  She presented to the ED on 03/17/18 with c/o SOB.  Heparin was started on admission for suspected PE. CTA on 10/29 came back negative for PE and heparin drip was d/ced on 10/30.  She underwent CT guided biopsy of right lung mass on 10/30, but was not able to obtain tissue.  Upper extremities doppler obtained on 11/5 to evaluate swelling showed bilateral acute DVTs.  To start heparin drip for DVT.  Today, 03/25/2018: HL 0.34, therapeutic on 1100 units/hr H/H low but stable Plts 416 No bleeding reported  Goal of Therapy:  Heparin level 0.3-0.7 units/ml Monitor platelets by anticoagulation protocol: Yes   Plan:  - slight increase to heparin drip to 1150 units/hr since level was low end of therapeutic - daily heparin level and CBC - monitor for s/s bleeding  Dolly Rias RPh 03/25/2018, 10:00 AM Pager [phone]

## 2018-03-25 NOTE — Progress Notes (Signed)
ANTICOAGULATION CONSULT NOTE - Follow Up Consult  Pharmacy Consult for Heparin Indication: acute bilateral UE DVT  No Known Allergies  Patient Measurements: Height: 5\' 9"  (175.3 cm) Weight: 104 lb (47.2 kg) IBW/kg (Calculated) : 66.2 Heparin Dosing Weight:   Vital Signs: Temp: 97.9 F (36.6 C) (11/05 2030) Temp Source: Oral (11/05 2030) BP: 168/83 (11/05 2217) Pulse Rate: 80 (11/05 2217)  Labs: Recent Labs    03/22/18 0525 03/23/18 0521 03/24/18 1020 03/24/18 1838 03/25/18 0037  HGB 9.5*  --   --  9.3* 8.9*  HCT 29.2*  --   --  28.6* 27.9*  PLT 384  --   --  395 416*  HEPARINUNFRC  --   --   --  0.54 0.44  CREATININE 0.43* 0.57 0.59  --  0.73    Estimated Creatinine Clearance: 54.3 mL/min (by C-G formula based on SCr of 0.73 mg/dL).   Medications:  Infusions:  . heparin 1,100 Units/hr (03/25/18 0200)    Assessment: Patient with heparin level at goal again.  No heparin issues noted.    Goal of Therapy:  Heparin level 0.3-0.7 units/ml Monitor platelets by anticoagulation protocol: Yes   Plan:  Continue heparin drip at current rate Recheck level at 0900   Tyler Deis, Shea Stakes Crowford 03/25/2018,3:30 AM

## 2018-03-26 ENCOUNTER — Inpatient Hospital Stay (HOSPITAL_COMMUNITY): Payer: Medicaid Other | Admitting: Anesthesiology

## 2018-03-26 ENCOUNTER — Inpatient Hospital Stay (HOSPITAL_COMMUNITY): Payer: Medicaid Other

## 2018-03-26 ENCOUNTER — Encounter (HOSPITAL_COMMUNITY)
Admission: EM | Disposition: A | Discharge status: Home Health | Payer: Self-pay | Source: Home / Self Care | Attending: Internal Medicine

## 2018-03-26 ENCOUNTER — Encounter (HOSPITAL_COMMUNITY): Payer: Self-pay

## 2018-03-26 HISTORY — PX: ENDOBRONCHIAL ULTRASOUND: SHX5096

## 2018-03-26 LAB — CBC
HCT: 27.9 % — ABNORMAL LOW (ref 36.0–46.0)
Hemoglobin: 9 g/dL — ABNORMAL LOW (ref 12.0–15.0)
MCH: 30.4 pg (ref 26.0–34.0)
MCHC: 32.3 g/dL (ref 30.0–36.0)
MCV: 94.3 fL (ref 80.0–100.0)
PLATELETS: 414 10*3/uL — AB (ref 150–400)
RBC: 2.96 MIL/uL — ABNORMAL LOW (ref 3.87–5.11)
RDW: 14.5 % (ref 11.5–15.5)
WBC: 9.7 10*3/uL (ref 4.0–10.5)
nRBC: 0 % (ref 0.0–0.2)

## 2018-03-26 LAB — HEPARIN LEVEL (UNFRACTIONATED): Heparin Unfractionated: <0.1 IU/mL — ABNORMAL LOW (ref 0.30–0.70)

## 2018-03-26 SURGERY — ENDOBRONCHIAL ULTRASOUND (EBUS)
Anesthesia: General | Laterality: Bilateral

## 2018-03-26 MED ORDER — PROPOFOL 10 MG/ML IV BOLUS
INTRAVENOUS | Status: AC
  Filled 2018-03-26: qty 20

## 2018-03-26 MED ORDER — HEPARIN (PORCINE) IN NACL 100-0.45 UNIT/ML-% IJ SOLN: 1150.0000 [IU]/h | INTRAMUSCULAR | Status: DC

## 2018-03-26 MED ORDER — SUGAMMADEX SODIUM 200 MG/2ML IV SOLN
INTRAVENOUS | Status: DC | PRN
  Administered 2018-03-26: 150 mg via INTRAVENOUS

## 2018-03-26 MED ORDER — DEXAMETHASONE SODIUM PHOSPHATE 10 MG/ML IJ SOLN
INTRAMUSCULAR | Status: DC | PRN
  Administered 2018-03-26: 4 mg via INTRAVENOUS

## 2018-03-26 MED ORDER — FENTANYL CITRATE (PF) 100 MCG/2ML IJ SOLN
INTRAMUSCULAR | Status: AC
  Filled 2018-03-26: qty 2

## 2018-03-26 MED ORDER — LACTATED RINGERS IV SOLN
INTRAVENOUS | Status: DC
  Administered 2018-03-26 (×2): via INTRAVENOUS

## 2018-03-26 MED ORDER — FENTANYL CITRATE (PF) 100 MCG/2ML IJ SOLN: 25.0000 ug | INTRAMUSCULAR | Status: DC | PRN

## 2018-03-26 MED ORDER — ONDANSETRON HCL 4 MG/2ML IJ SOLN
INTRAMUSCULAR | Status: DC | PRN
  Administered 2018-03-26: 4 mg via INTRAVENOUS

## 2018-03-26 MED ORDER — PHENYLEPHRINE 40 MCG/ML (10ML) SYRINGE FOR IV PUSH (FOR BLOOD PRESSURE SUPPORT)
PREFILLED_SYRINGE | INTRAVENOUS | Status: DC | PRN
  Administered 2018-03-26 (×2): 80 ug via INTRAVENOUS
  Administered 2018-03-26 (×4): 120 ug via INTRAVENOUS

## 2018-03-26 MED ORDER — SODIUM CHLORIDE 0.9 % IV SOLN
INTRAVENOUS | Status: DC | PRN
  Administered 2018-03-26: 50 ug/min via INTRAVENOUS

## 2018-03-26 MED ORDER — ROCURONIUM BROMIDE 10 MG/ML (PF) SYRINGE
PREFILLED_SYRINGE | INTRAVENOUS | Status: DC | PRN
  Administered 2018-03-26: 40 mg via INTRAVENOUS

## 2018-03-26 MED ORDER — HEPARIN (PORCINE) 25000 UT/250ML-% IV SOLN
1250.0000 [IU]/h | INTRAVENOUS | Status: DC
  Administered 2018-03-26: 1150 [IU]/h via INTRAVENOUS
  Filled 2018-03-26: qty 250

## 2018-03-26 MED ORDER — FENTANYL CITRATE (PF) 250 MCG/5ML IJ SOLN
INTRAMUSCULAR | Status: DC | PRN
  Administered 2018-03-26: 50 ug via INTRAVENOUS

## 2018-03-26 MED ORDER — LIDOCAINE 2% (20 MG/ML) 5 ML SYRINGE
INTRAMUSCULAR | Status: DC | PRN
  Administered 2018-03-26: 50 mg via INTRAVENOUS

## 2018-03-26 MED ORDER — PROPOFOL 10 MG/ML IV BOLUS
INTRAVENOUS | Status: DC | PRN
  Administered 2018-03-26: 50 mg via INTRAVENOUS
  Administered 2018-03-26: 100 mg via INTRAVENOUS

## 2018-03-26 MED ORDER — ONDANSETRON HCL 4 MG/2ML IJ SOLN: 4.0000 mg | Freq: Once | INTRAMUSCULAR | Status: DC | PRN

## 2018-03-26 NOTE — Progress Notes (Signed)
PT Cancellation Note  Patient Details Name: Maryse Brierley MRN: [id number] DOB: [birth year]   Cancelled Treatment:      ENDOBRONCHIAL ULTRASOUND (Bilateral )    Rica Koyanagi  PTA Acute  Rehabilitation Services Pager      [phone] Office      [phone]

## 2018-03-26 NOTE — Plan of Care (Signed)
Pt ate well this shift.

## 2018-03-26 NOTE — Progress Notes (Signed)
OT Cancellation Note  Patient Details Name: Sonya [name] MRN: [id number] DOB: 1956-04-18   Cancelled Treatment:     Cx procedure  Qaadir Kent 03/26/2018, 2:00 PM  Lesle Chris, OTR/L Acute Rehabilitation Services [phone] [facility] pager [phone] office 03/26/2018

## 2018-03-26 NOTE — Progress Notes (Signed)
ANTICOAGULATION CONSULT NOTE   Pharmacy Consult for heparin Indication: acute bilateral UE DVT  No Known Allergies  Patient Measurements: Height: 5\' 9"  (175.3 cm) Weight: 104 lb (47.2 kg) IBW/kg (Calculated) : 66.2 Heparin Dosing Weight: 47 kg  Vital Signs: Temp: 98 F (36.7 C) (11/07 1500) Temp Source: Oral (11/07 1058) BP: 122/69 (11/07 1500) Pulse Rate: 86 (11/07 1500)  Labs: Recent Labs    03/24/18 1020  03/24/18 1838 03/25/18 0037 03/25/18 0904 03/26/18 0624  HGB  --    < > 9.3* 8.9*  --  9.0*  HCT  --   --  28.6* 27.9*  --  27.9*  PLT  --   --  395 416*  --  414*  HEPARINUNFRC  --    < > 0.54 0.44 0.34 <0.10*  CREATININE 0.59  --   --  0.73  --   --    < > = values in this interval not displayed.    Estimated Creatinine Clearance: 54.3 mL/min (by C-G formula based on SCr of 0.73 mg/dL).  Assessment: Patient's a 61 y.o F with recent dx of right lung mass involving SVC three months ago, but did not follow up with her doctor for further workup.  She presented to the ED on 03/17/18 with c/o SOB.  Heparin was started on admission for suspected PE. CTA on 10/29 came back negative for PE and heparin drip was d/ced on 10/30.  She underwent CT guided biopsy of right lung mass on 10/30, but was not able to obtain tissue.  Upper extremities doppler obtained on 11/5 to evaluate swelling showed bilateral acute DVTs.  To start heparin drip for DVT.  Today, 03/26/2018: HL 0.10,sub therapeutic BUT drawn after heparin had been turned off H/H low but stable Plts 414 No bleeding reported  Goal of Therapy:  Heparin level 0.3-0.7 units/ml Monitor platelets by anticoagulation protocol: Yes   Plan:   Messaged Dr. Loanne Drilling after procedure, ok to resume heparin tonight at 1830  Resumed at previous rate of 1150 units/hr  HL 6 hours after restart of med.   Monitor for signs and symptoms of bleeding   Royetta Asal, PharmD, BCPS Pager [phone] 03/26/2018 4:11 PM

## 2018-03-26 NOTE — Progress Notes (Signed)
Nutrition Follow-up  DOCUMENTATION CODES:   Underweight(Will assess for malnutrition at follow-up.)  INTERVENTION:  - Continue Ensure Enlive BID. - Continue to encourage PO intakes. - Will perform NFPE at follow-up.   NUTRITION DIAGNOSIS:   Increased nutrient needs related to chronic illness, catabolic illness as evidenced by estimated needs. -ongoing  GOAL:   Patient will meet greater than or equal to 90% of their needs -likely minimally met on average  MONITOR:   PO intake, Supplement acceptance, Weight trends, Labs  ASSESSMENT:   62 y.o. female with medical history significant of right lung mass presenting to the hospital for evaluation of SOB. 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 Patient was offered admission for further work-up at that time but declined. Patient statesd she did not follow-up with a doctor since her ED discharge in July. She continued to smoke cigarettes and quit a week ago. Stated that she has been feeling SOB with exertion and when lying down for the past 2 weeks. Also having a cough productive of white-colored sputum. Symptoms have been getting progressively worse. She has unintentionally lost 5 to 6 pounds since July.  No new weight since 10/29. Per chart review, patient has mainly been consuming 50-100% for the past few days. She has been accepting Ensure Enlive ~75% of the time offered. Patient is currently out of the room to Endo. RD unable to see patient later today d/t prior professional engagement from 11:30 AM-5:00 PM. Will attempt to see patient and obtain further information at another time.     Medications reviewed; daily multivitamin with minerals, 20 mg Deltasone/day.  Labs reviewed; BUN: 26 mg/dL, Ca: 8.7 mg/dL.      NUTRITION - FOCUSED PHYSICAL EXAM:  Will attempt at follow-up.  Diet Order:   Diet Order            Diet NPO time  specified  Diet effective midnight              EDUCATION NEEDS:   No education needs have been identified at this time  Skin:  Skin Assessment: Reviewed RN Assessment  Last BM:  11/5  Height:   Ht Readings from Last 1 Encounters:  03/17/18 '5\' 9"'$  (1.753 m)    Weight:   Wt Readings from Last 1 Encounters:  03/17/18 47.2 kg    Ideal Body Weight:  65.91 kg  BMI:  Body mass index is 15.36 kg/m.  Estimated Nutritional Needs:   Kcal:  1650-1890 (35-40 kcal/kg)  Protein:  70-85 grams (1.5-1.8 grams/kg)  Fluid:  >/= 1.7 L/day      Jarome Matin, MS, RD, LDN, [facility] Inpatient Clinical Dietitian Pager # [phone] After hours/weekend pager # [phone]

## 2018-03-26 NOTE — Anesthesia Preprocedure Evaluation (Addendum)
Anesthesia Evaluation  Patient identified by MRN, date of birth, ID band Patient awake    Reviewed: Allergy & Precautions, NPO status , Patient's Chart, lab work & pertinent test results  Airway Mallampati: II  TM Distance: >3 FB Neck ROM: Full    Dental  (+) Dental Advisory Given, Poor Dentition, Missing, Loose   Pulmonary COPD,  COPD inhaler, Current Smoker,  RUL lung mass    + decreased breath sounds      Cardiovascular hypertension, Pt. on medications + dysrhythmias Atrial Fibrillation  Rhythm:Irregular Rate:Abnormal     Neuro/Psych negative neurological ROS  negative psych ROS   GI/Hepatic negative GI ROS, Neg liver ROS,   Endo/Other  negative endocrine ROS  Renal/GU negative Renal ROS     Musculoskeletal negative musculoskeletal ROS (+)   Abdominal   Peds  Hematology  (+) Blood dyscrasia, anemia ,   Anesthesia Other Findings Day of surgery medications reviewed with the patient.  Reproductive/Obstetrics                            Anesthesia Physical Anesthesia Plan  ASA: IV  Anesthesia Plan: General   Post-op Pain Management:    Induction: Intravenous  PONV Risk Score and Plan: 2 and Ondansetron and Dexamethasone  Airway Management Planned: Oral ETT  Additional Equipment:   Intra-op Plan:   Post-operative Plan: Possible Post-op intubation/ventilation  Informed Consent: I have reviewed the patients History and Physical, chart, labs and discussed the procedure including the risks, benefits and alternatives for the proposed anesthesia with the patient or authorized representative who has indicated his/her understanding and acceptance.   Dental advisory given  Plan Discussed with: CRNA  Anesthesia Plan Comments:         Anesthesia Quick Evaluation

## 2018-03-26 NOTE — Progress Notes (Signed)
[facility] PROGRESS NOTE    Progress Note  Sonya [name]  GGY:[id number] DOB: Sep 06, 1955 DOA: 03/17/2018 PCP: Default, Provider, MD     Brief Narrative:   Sonya [name] is an 62 y.o. female past medical history significant for right lung mass who presented to the hospital for shortness of breath on December 04, 2017 with a CT chest that revealed a large right suprahilar mass with involvement of the right upper lobe bronchus and SVC during this time she was offered admission and further work-up and she declined.  She did not follow-up with her primary care doctor when discharge in July she continued to smoke, she relates she comes back today to the hospital feeling short of breath on exertion and when laying down also having a productive cough with progressive symptoms repeated CTA does not show a PE does show increased vascular invasion mass-effect by the suprahilar mass resulting in new occlusion of the right middle lobe and persistent right upper lobe arterial occlusion as well as superior vena cava, oncology was consulted who recommended IR to perform a CT-guided biopsy which was unsuccessful.  Pulmonary and critical care was consulted for EBUS.,  He was also noted to be in COPD exacerbation and placed on steroids antibiotics and inhalers.  On 03/20/2018 she developed new onset atrial fibrillation.  Assessment/Plan:   Pulmonary mass/invading the right pulmonary artery, superior vena cava/superior vena cava syndrome.: Oncology was consulted and is on board. IR asked to evaluate for possible CT-guided biopsy done on 03/18/2018 which showed serous fluid. Pulmonary and critical care was consulted who felt bronchoscopy may lead to laceration of pulmonary artery and he was high risk. Critical care to perform a EBUS on 11.7.2019. However IR recommended a PET scan to better start the adverse coloscopy versus alternative therapy. Talked to her about palliative care and will get a  consult.  Malignant hypercalcemia: Treated with IV fluids, Zometa calcitonin and Lasix. His IV fluids and Lasix have been discontinued. Calcium is stable.  Bilateral upper extremity clot: She was started empirically on IV heparin.  New onset atrial fibrillation: Converted to sinus rhythm on 03/20/2018. CHADVASC score of 2 2D echo was done that showed an EF of 60% no valve abnormalities. Now on oral Cardizem every 6 hours. Cardiology was consulted who agree with management and he agreed that if the patient developed a fib again need to be on anticoagulation, Eliquis as an outpatient for new bilateral upper extremity DVTs. Currently on IV heparin.  Probable COPD: Has completed treatment.  Chronic anemia Anemia Panel with iron level, 14 a ferritin of 103.  Hypertensive urgency: On lisinopril and Cardizem. We will add low-dose hydrochlorothiazide.  Hypokalemia: Repleted orally.  Monitor closely.  Leukocytosis: Likely due to steroids, she has remained afebrile white blood cell count is improving.   DVT prophylaxis: Heparin Family Communication:none Disposition Plan/Barrier to D/C: unable to determine Code Status:     Code Status Orders  (From admission, onward)         Start     Ordered   03/17/18 2059  Full code  Continuous     03/17/18 2100        Code Status History    This patient has a current code status but no historical code status.        IV Access:    Peripheral IV   Procedures and diagnostic studies:   Vas [place] Upper Extremity Venous Duplex  Result Date: 03/24/2018 UPPER VENOUS STUDY  Indications: Swelling Performing Technologist:  Abram Sander  Examination Guidelines: A complete evaluation includes B-mode imaging, spectral Doppler, color Doppler, and power Doppler as needed of all accessible portions of each vessel. Bilateral testing is considered an integral part of a complete examination. Limited examinations for reoccurring indications may be  performed as noted.  Right Findings: +----------+------------+----------+---------+-----------+-------+ RIGHT     CompressiblePropertiesPhasicitySpontaneousSummary +----------+------------+----------+---------+-----------+-------+ IJV           None                 No        No             +----------+------------+----------+---------+-----------+-------+ Subclavian    None                 No        No             +----------+------------+----------+---------+-----------+-------+ Axillary      None                 No        No             +----------+------------+----------+---------+-----------+-------+ Brachial    Partial                                         +----------+------------+----------+---------+-----------+-------+ Radial        Full                                          +----------+------------+----------+---------+-----------+-------+ Ulnar         Full                                          +----------+------------+----------+---------+-----------+-------+ Cephalic      Full                                          +----------+------------+----------+---------+-----------+-------+ Basilic       None                                          +----------+------------+----------+---------+-----------+-------+  Left Findings: +----------+------------+----------+---------+-----------+-------+ LEFT      CompressiblePropertiesPhasicitySpontaneousSummary +----------+------------+----------+---------+-----------+-------+ IJV           None                 No        No             +----------+------------+----------+---------+-----------+-------+ Subclavian    Full                 Yes       Yes            +----------+------------+----------+---------+-----------+-------+ Axillary                           Yes       Yes            +----------+------------+----------+---------+-----------+-------+ Brachial  Partial                                         +----------+------------+----------+---------+-----------+-------+ Radial        Full                                          +----------+------------+----------+---------+-----------+-------+ Ulnar         Full                                          +----------+------------+----------+---------+-----------+-------+ Cephalic      Full                                          +----------+------------+----------+---------+-----------+-------+ Basilic       Full                                          +----------+------------+----------+---------+-----------+-------+  Summary:  Right: Findings consistent with acute deep vein thrombosis involving the right internal jugular veins, right subclavian veins, right axillary vein and right brachial veins.  Left: Findings consistent with acute deep vein thrombosis involving the left internal jugular veins and left brachial veins.  *See table(s) above for measurements and observations.  Diagnosing physician: Harold Barban MD Electronically signed by Harold Barban MD on 03/24/2018 at 6:55:33 PM.    Final      Medical Consultants:    None.  Anti-Infectives:    Subjective:    Sonya [name] relates her breathing is unchanged she continues to cough.  Objective:    Vitals:   03/25/18 2203 03/26/18 0515 03/26/18 0739 03/26/18 0752  BP: 125/68 (!) 129/98    Pulse: 87 75    Resp: 20 20    Temp: 97.7 F (36.5 C) 98.1 F (36.7 C)    TempSrc: Oral Oral    SpO2: 100% 100% 95% 95%  Weight:      Height:        Intake/Output Summary (Last 24 hours) at 03/26/2018 0856 Last data filed at 03/26/2018 0700 Gross per 24 hour  Intake 319.27 ml  Output 1400 ml  Net -1080.73 ml   Filed Weights   03/17/18 1612 03/17/18 2131  Weight: 56.7 kg 47.2 kg    Exam: General exam: In no acute distress. Respiratory system: Good air movement and clear to  auscultation. Cardiovascular system: S1 & S2 heard, RRR.  You can appreciate all of her neck veins. Gastrointestinal system: Abdomen is nondistended, soft and nontender.  Central nervous system: Alert and oriented. No focal neurological deficits. Extremities: No pedal edema. Skin: No rashes, lesions or ulcers Psychiatry: Judgement and insight appear normal. Mood & affect appropriate.    Data Reviewed:    Labs: Basic Metabolic Panel: Recent Labs  Lab 03/20/18 0552 03/21/18 0437 03/22/18 0525 03/23/18 0521 03/24/18 1020 03/25/18 0037  NA 131* 135 135 138 137 138  K 4.3 4.1 3.2* 3.7 3.9 3.8  CL 100 102 99 102 100 100  CO2 25  25 28 28 28 31   GLUCOSE 142* 198* 112* 93 99 109*  BUN 10 17 15 19 23  26*  CREATININE 0.55 0.56 0.43* 0.57 0.59 0.73  CALCIUM 11.1* 10.0 8.8* 8.4* 8.9 8.7*  MG  --  1.7 2.5*  --   --   --   PHOS 3.3 2.0*  --   --   --   --    GFR Estimated Creatinine Clearance: 54.3 mL/min (by C-G formula based on SCr of 0.73 mg/dL). Liver Function Tests: Recent Labs  Lab 03/21/18 0437 03/24/18 1020 03/25/18 0037  AST 18 21 17   ALT 11 17 15   ALKPHOS 60 52 50  BILITOT 0.2* 0.5 0.5  PROT 7.3 7.4 7.2  ALBUMIN 2.5* 2.7* 2.9*   No results for input(s): LIPASE, AMYLASE in the last 168 hours. No results for input(s): AMMONIA in the last 168 hours. Coagulation profile No results for input(s): INR, PROTIME in the last 168 hours.  CBC: Recent Labs  Lab 03/20/18 0552 03/21/18 0437 03/22/18 0525 03/24/18 1838 03/25/18 0037 03/26/18 0624  WBC 13.7* 16.0* 12.8* 14.1* 12.0* 9.7  NEUTROABS 12.8* 15.2* 11.0*  --   --   --   HGB 8.9* 8.7* 9.5* 9.3* 8.9* 9.0*  HCT 27.9* 26.9* 29.2* 28.6* 27.9* 27.9*  MCV 94.3 94.1 93.0 94.1 94.3 94.3  PLT 292 338 384 395 416* 414*   Cardiac Enzymes: Recent Labs  Lab 03/20/18 1206 03/20/18 2323  TROPONINI <0.03 <0.03   BNP (last 3 results) No results for input(s): PROBNP in the last 8760 hours. CBG: No results for input(s):  GLUCAP in the last 168 hours. D-Dimer: No results for input(s): DDIMER in the last 72 hours. Hgb A1c: No results for input(s): HGBA1C in the last 72 hours. Lipid Profile: No results for input(s): CHOL, HDL, LDLCALC, TRIG, CHOLHDL, LDLDIRECT in the last 72 hours. Thyroid function studies: No results for input(s): TSH, T4TOTAL, T3FREE, THYROIDAB in the last 72 hours.  Invalid input(s): FREET3 Anemia work up: No results for input(s): VITAMINB12, FOLATE, FERRITIN, TIBC, IRON, RETICCTPCT in the last 72 hours. Sepsis Labs: Recent Labs  Lab 03/22/18 0525 03/24/18 1838 03/25/18 0037 03/26/18 0624  WBC 12.8* 14.1* 12.0* 9.7   Microbiology No results found for this or any previous visit (from the past 240 hour(s)).   Medications:   . budesonide (PULMICORT) nebulizer solution  0.5 mg Nebulization BID  . dextromethorphan-guaiFENesin  1 tablet Oral BID  . diltiazem  120 mg Oral Daily  . feeding supplement (ENSURE ENLIVE)  237 mL Oral BID BM  . hydrochlorothiazide  12.5 mg Oral Daily  . ipratropium  0.5 mg Nebulization TID  . levalbuterol  0.63 mg Nebulization TID  . lisinopril  20 mg Oral Daily  . multivitamin with minerals  1 tablet Oral Daily  . predniSONE  20 mg Oral QAC breakfast   Continuous Infusions:     LOS: 8 days   Charlynne Cousins  [facility] Pager [phone]  *Please refer to [email or web address], password [email or web address] to get updated schedule on who will round on this patient, as hospitalists switch teams weekly. If 7PM-7AM, please contact night-coverage at [email or web address], password [email or web address] for any overnight needs.  03/26/2018, 8:56 AM

## 2018-03-26 NOTE — Anesthesia Postprocedure Evaluation (Signed)
Anesthesia Post Note  Patient: Sonya [name]  Procedure(s) Performed: ENDOBRONCHIAL ULTRASOUND (Bilateral )     Patient location during evaluation: PACU Anesthesia Type: General Level of consciousness: sedated Pain management: pain level controlled Vital Signs Assessment: post-procedure vital signs reviewed and stable Respiratory status: spontaneous breathing and respiratory function stable Cardiovascular status: stable Postop Assessment: no apparent nausea or vomiting Anesthetic complications: no    Last Vitals:  Vitals:   03/26/18 1445 03/26/18 1500  BP: 119/77 122/69  Pulse: 89 86  Resp: 20 19  Temp:  36.7 C  SpO2: 99% 96%    Last Pain:  Vitals:   03/26/18 1500  TempSrc:   PainSc: 0-No pain                 Jannat Rosemeyer DANIEL

## 2018-03-26 NOTE — Progress Notes (Signed)
ANTICOAGULATION CONSULT NOTE   Pharmacy Consult for heparin Indication: acute bilateral UE DVT  No Known Allergies  Patient Measurements: Height: 5\' 9"  (175.3 cm) Weight: 104 lb (47.2 kg) IBW/kg (Calculated) : 66.2 Heparin Dosing Weight: 47 kg  Vital Signs: Temp: 98.1 F (36.7 C) (11/07 0515) Temp Source: Oral (11/07 0515) BP: 129/79 (11/07 0854) Pulse Rate: 75 (11/07 0515)  Labs: Recent Labs    03/24/18 1020  03/24/18 1838 03/25/18 0037 03/25/18 0904 03/26/18 0624  HGB  --    < > 9.3* 8.9*  --  9.0*  HCT  --   --  28.6* 27.9*  --  27.9*  PLT  --   --  395 416*  --  414*  HEPARINUNFRC  --    < > 0.54 0.44 0.34 <0.10*  CREATININE 0.59  --   --  0.73  --   --    < > = values in this interval not displayed.    Estimated Creatinine Clearance: 54.3 mL/min (by C-G formula based on SCr of 0.73 mg/dL).  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  She presented to the ED on 03/17/18 with c/o SOB.  Heparin was started on admission for suspected PE. CTA on 10/29 came back negative for PE and heparin drip was d/ced on 10/30.  She underwent CT guided biopsy of right lung mass on 10/30, but was not able to obtain tissue.  Upper extremities doppler obtained on 11/5 to evaluate swelling showed bilateral acute DVTs.  To start heparin drip for DVT.  Today, 03/26/2018: HL 0.10,sub therapeutic BUT drawn after heparin had been turned off H/H low but stable Plts 414 No bleeding reported  Goal of Therapy:  Heparin level 0.3-0.7 units/ml Monitor platelets by anticoagulation protocol: Yes   Plan:  Heparin turned off this am in preparation for bronch today at  1200 F/u resuming heparin afterwards  Dolly Rias RPh 03/26/2018, 10:41 AM Pager [phone]

## 2018-03-26 NOTE — Anesthesia Procedure Notes (Signed)
Procedure Name: Intubation Date/Time: 03/26/2018 12:45 PM Performed by: Niel Hummer, CRNA Pre-anesthesia Checklist: Patient identified, Emergency Drugs available, Suction available and Patient being monitored Patient Re-evaluated:Patient Re-evaluated prior to induction Oxygen Delivery Method: Circle system utilized Preoxygenation: Pre-oxygenation with 100% oxygen Induction Type: IV induction Ventilation: Mask ventilation without difficulty Laryngoscope Size: Mac and 4 Grade View: Grade II Tube size: 8.5 mm Number of attempts: 1 Airway Equipment and Method: Stylet Placement Confirmation: ETT inserted through vocal cords under direct vision,  positive ETCO2 and breath sounds checked- equal and bilateral Secured at: 23 cm Tube secured with: Tape Dental Injury: Teeth and Oropharynx as per pre-operative assessment

## 2018-03-26 NOTE — Progress Notes (Signed)
Hep gtt D/C'd @ 0600.

## 2018-03-26 NOTE — Op Note (Signed)
Video Bronchoscopy with Endobronchial Ultrasound Procedure Note  Date of Operation: 03/26/2018  Pre-op Diagnosis: Right upper lobe lung mass  Post-op Diagnosis: Right upper lobe lung mass  Surgeon: Rodman Pickle, MD  Assistants: None  Anesthesia: General endotracheal anesthesia  Operation: Flexible video fiberoptic bronchoscopy with endobronchial ultrasound and biopsies.  Estimated Blood Loss: Minimal  Complications: None  Indications and History: Sonya [name] is a 62 y.o. female with right upper lung mass.  The risks, benefits, complications, treatment options and expected outcomes were discussed with the patient.  The possibilities of pneumothorax, pneumonia, reaction to medication, pulmonary aspiration, perforation of a viscus, bleeding, failure to diagnose a condition and creating a complication requiring transfusion or operation were discussed with the patient who freely signed the consent.    Description of Procedure: The patient was examined in the preoperative area and history and data from the preprocedure consultation were reviewed. It was deemed appropriate to proceed.  The patient was taken to OR, identified as Sonya [name] and the procedure verified as Flexible Video Fiberoptic Bronchoscopy.  A Time Out was held and the above information confirmed. After being taken to the operating room general anesthesia was initiated and the patient  was orally intubated. The video fiberoptic bronchoscope was introduced via the endotracheal tube and a general inspection was performed which showed 100% obstruction of R mainstem with mildly erythematous friable mucosa. Left side was examined and showed normal, patent anatomy with thin secretions present. The standard scope was then withdrawn and the endobronchial ultrasound was used to identify and characterize the peritracheal, hilar and bronchial lymph nodes. Using real-time ultrasound guidance transbronchial needle aspirations  were taken from [place], 7 and 4/right hilum nodes and were sent for cytology. Cell block was also collected. Flexible bronchoscope was re-introduced and easily passed through the right mainstem. Distal airways visualized and patent. BAL was performed on RLL of the lung with 60cc instilled and 30cc collected. The patient tolerated the procedure well without apparent complications. There was no significant blood loss. The bronchoscope was withdrawn. Anesthesia was reversed and the patient was taken to the PACU for recovery.   Plans:  The patient will be discharged from the PACU to home when recovered from anesthesia. We will review the cytology, pathology and microbiology results with the patient when they become available. Outpatient followup will be with Pulmonary.   Rodman Pickle, M.D. [facility] Pulmonary/Critical Care Medicine Pager: [phone] After hours pager: [phone]

## 2018-03-27 ENCOUNTER — Inpatient Hospital Stay: Payer: Medicaid Other | Admitting: Internal Medicine

## 2018-03-27 ENCOUNTER — Ambulatory Visit: Payer: Medicaid Other | Admitting: Family Medicine

## 2018-03-27 DIAGNOSIS — Z7189 Other specified counseling: Secondary | ICD-10-CM

## 2018-03-27 DIAGNOSIS — Z515 Encounter for palliative care: Secondary | ICD-10-CM

## 2018-03-27 LAB — BASIC METABOLIC PANEL
ANION GAP: 10 (ref 5–15)
BUN: 26 mg/dL — ABNORMAL HIGH (ref 8–23)
CHLORIDE: 99 mmol/L (ref 98–111)
CO2: 26 mmol/L (ref 22–32)
Calcium: 8.4 mg/dL — ABNORMAL LOW (ref 8.9–10.3)
Creatinine, Ser: 0.83 mg/dL (ref 0.44–1.00)
GFR calc Af Amer: >60 mL/min (ref >60)
GFR calc non Af Amer: >60 mL/min (ref >60)
Glucose, Bld: 158 mg/dL — ABNORMAL HIGH (ref 70–99)
POTASSIUM: 4 mmol/L (ref 3.5–5.1)
SODIUM: 135 mmol/L (ref 135–145)

## 2018-03-27 LAB — CBC
HCT: 27.5 % — ABNORMAL LOW (ref 36.0–46.0)
HEMOGLOBIN: 8.7 g/dL — AB (ref 12.0–15.0)
MCH: 30.5 pg (ref 26.0–34.0)
MCHC: 31.6 g/dL (ref 30.0–36.0)
MCV: 96.5 fL (ref 80.0–100.0)
Platelets: 400 10*3/uL (ref 150–400)
RBC: 2.85 MIL/uL — AB (ref 3.87–5.11)
RDW: 14.6 % (ref 11.5–15.5)
WBC: 12.5 10*3/uL — ABNORMAL HIGH (ref 4.0–10.5)
nRBC: 0 % (ref 0.0–0.2)

## 2018-03-27 LAB — HEPARIN LEVEL (UNFRACTIONATED)
HEPARIN UNFRACTIONATED: 0.28 [IU]/mL — AB (ref 0.30–0.70)
HEPARIN UNFRACTIONATED: 0.24 [IU]/mL — AB (ref 0.30–0.70)

## 2018-03-27 MED ORDER — APIXABAN 5 MG PO TABS: 5.0000 mg | ORAL_TABLET | Freq: Two times a day (BID) | ORAL | Status: DC

## 2018-03-27 MED ORDER — APIXABAN 5 MG PO TABS
10.0000 mg | ORAL_TABLET | Freq: Two times a day (BID) | ORAL | Status: DC
  Administered 2018-03-27 – 2018-03-28 (×3): 10 mg via ORAL
  Filled 2018-03-27 (×3): qty 2

## 2018-03-27 MED ORDER — IPRATROPIUM BROMIDE 0.02 % IN SOLN: 0.5000 mg | RESPIRATORY_TRACT | Status: DC | PRN

## 2018-03-27 NOTE — Progress Notes (Signed)
[facility] PROGRESS NOTE    Progress Note  Takirah Binford  JJO:[id number] DOB: February 29, 1956 DOA: 03/17/2018 PCP: Default, Provider, MD     Brief Narrative:   Sonya [name] is an 62 y.o. female past medical history significant for right lung mass who presented to the hospital for shortness of breath on December 04, 2017 with a CT chest that revealed a large right suprahilar mass with involvement of the right upper lobe bronchus and SVC during this time she was offered admission and further work-up and she declined.  She did not follow-up with her primary care doctor when discharge in July she continued to smoke, she relates she comes back today to the hospital feeling short of breath on exertion and when laying down also having a productive cough with progressive symptoms repeated CTA does not show a PE does show increased vascular invasion mass-effect by the suprahilar mass resulting in new occlusion of the right middle lobe and persistent right upper lobe arterial occlusion as well as superior vena cava, oncology was consulted who recommended IR to perform a CT-guided biopsy which was unsuccessful.  Pulmonary and critical care was consulted for EBUS.,  He was also noted to be in COPD exacerbation and placed on steroids antibiotics and inhalers.  On 03/20/2018 she developed new onset atrial fibrillation.  Assessment/Plan:   Pulmonary mass/invading the right pulmonary artery, superior vena cava/superior vena cava syndrome.: We will reconsult oncology to see how to proceed. 0IR asked to evaluate for possible CT-guided biopsy done on 03/18/2018 which showed serous fluid. Pulmonary and critical care was consulted who felt bronchoscopy may lead to laceration of pulmonary artery and he was high risk. Critical care to perform a EBUS on 11.7.2019, preliminary reports concerning for non-small cell cancer Agreed to talk with hospice and palliative care. Discontinue telemetry.  Malignant  hypercalcemia: Treated with IV fluids, Zometa calcitonin and Lasix. His IV fluids and Lasix have been discontinued. Calcium is stable.  Bilateral upper extremity clot: She was started empirically on IV heparin.  New onset atrial fibrillation: Converted to sinus rhythm on 03/20/2018. CHADVASC score of 2 Continue Cardizem Currently on IV heparin until finally pathology report.  Probable COPD: Has completed treatment.  Chronic anemia Anemia Panel with iron level, 14 a ferritin of 103.  Hypertensive urgency: On lisinopril and Cardizem. We will add low-dose hydrochlorothiazide.  Hypokalemia: Repleted orally.  Monitor closely.  Leukocytosis: Likely due to steroids, she has remained afebrile white blood cell count is improving.   DVT prophylaxis: Heparin Family Communication:none Disposition Plan/Barrier to D/C: unable to determine Code Status:     Code Status Orders  (From admission, onward)         Start     Ordered   03/17/18 2059  Full code  Continuous     03/17/18 2100        Code Status History    This patient has a current code status but no historical code status.        IV Access:    Peripheral IV   Procedures and diagnostic studies:   No results found.   Medical Consultants:    None.  Anti-Infectives:    Subjective:    Kamee Bobst relates her breathing is unchanged she continues to cough.  Objective:    Vitals:   03/26/18 1515 03/26/18 2035 03/26/18 2131 03/27/18 0453  BP: 110/67  (!) 114/55 128/81  Pulse: 85  95 79  Resp: 19  18 16   Temp:   98.5 F (  36.9 C) 98.5 F (36.9 C)  TempSrc:   Oral Oral  SpO2: 93% 98% 100% 100%  Weight:      Height:        Intake/Output Summary (Last 24 hours) at 03/27/2018 0815 Last data filed at 03/27/2018 0600 Gross per 24 hour  Intake 2381.31 ml  Output 1400 ml  Net 981.31 ml   Filed Weights   03/17/18 1612 03/17/18 2131  Weight: 56.7 kg 47.2 kg    Exam: General exam: In  no acute distress. Respiratory system: Good air movement and clear to auscultation. Cardiovascular system: S1 & S2 heard, RRR.  You can appreciate all of her neck veins. Gastrointestinal system: Abdomen is nondistended, soft and nontender.  Central nervous system: Alert and oriented. No focal neurological deficits. Extremities: No pedal edema. Skin: No rashes, lesions or ulcers Psychiatry: Judgement and insight appear normal. Mood & affect appropriate.    Data Reviewed:    Labs: Basic Metabolic Panel: Recent Labs  Lab 03/21/18 0437 03/22/18 0525 03/23/18 0521 03/24/18 1020 03/25/18 0037 03/27/18 0550  NA 135 135 138 137 138 135  K 4.1 3.2* 3.7 3.9 3.8 4.0  CL 102 99 102 100 100 99  CO2 25 28 28 28 31 26   GLUCOSE 198* 112* 93 99 109* 158*  BUN 17 15 19 23  26* 26*  CREATININE 0.56 0.43* 0.57 0.59 0.73 0.83  CALCIUM 10.0 8.8* 8.4* 8.9 8.7* 8.4*  MG 1.7 2.5*  --   --   --   --   PHOS 2.0*  --   --   --   --   --    GFR Estimated Creatinine Clearance: 52.4 mL/min (by C-G formula based on SCr of 0.83 mg/dL). Liver Function Tests: Recent Labs  Lab 03/21/18 0437 03/24/18 1020 03/25/18 0037  AST 18 21 17   ALT 11 17 15   ALKPHOS 60 52 50  BILITOT 0.2* 0.5 0.5  PROT 7.3 7.4 7.2  ALBUMIN 2.5* 2.7* 2.9*   No results for input(s): LIPASE, AMYLASE in the last 168 hours. No results for input(s): AMMONIA in the last 168 hours. Coagulation profile No results for input(s): INR, PROTIME in the last 168 hours.  CBC: Recent Labs  Lab 03/21/18 0437 03/22/18 0525 03/24/18 1838 03/25/18 0037 03/26/18 0624 03/27/18 0550  WBC 16.0* 12.8* 14.1* 12.0* 9.7 12.5*  NEUTROABS 15.2* 11.0*  --   --   --   --   HGB 8.7* 9.5* 9.3* 8.9* 9.0* 8.7*  HCT 26.9* 29.2* 28.6* 27.9* 27.9* 27.5*  MCV 94.1 93.0 94.1 94.3 94.3 96.5  PLT 338 384 395 416* 414* 400   Cardiac Enzymes: Recent Labs  Lab 03/20/18 1206 03/20/18 2323  TROPONINI <0.03 <0.03   BNP (last 3 results) No results for  input(s): PROBNP in the last 8760 hours. CBG: No results for input(s): GLUCAP in the last 168 hours. D-Dimer: No results for input(s): DDIMER in the last 72 hours. Hgb A1c: No results for input(s): HGBA1C in the last 72 hours. Lipid Profile: No results for input(s): CHOL, HDL, LDLCALC, TRIG, CHOLHDL, LDLDIRECT in the last 72 hours. Thyroid function studies: No results for input(s): TSH, T4TOTAL, T3FREE, THYROIDAB in the last 72 hours.  Invalid input(s): FREET3 Anemia work up: No results for input(s): VITAMINB12, FOLATE, FERRITIN, TIBC, IRON, RETICCTPCT in the last 72 hours. Sepsis Labs: Recent Labs  Lab 03/24/18 1838 03/25/18 0037 03/26/18 0624 03/27/18 0550  WBC 14.1* 12.0* 9.7 12.5*   Microbiology No results found for this  or any previous visit (from the past 240 hour(s)).   Medications:   . budesonide (PULMICORT) nebulizer solution  0.5 mg Nebulization BID  . dextromethorphan-guaiFENesin  1 tablet Oral BID  . diltiazem  120 mg Oral Daily  . feeding supplement (ENSURE ENLIVE)  237 mL Oral BID BM  . hydrochlorothiazide  12.5 mg Oral Daily  . ipratropium  0.5 mg Nebulization TID  . levalbuterol  0.63 mg Nebulization TID  . lisinopril  20 mg Oral Daily  . multivitamin with minerals  1 tablet Oral Daily   Continuous Infusions: . heparin 1,250 Units/hr (03/27/18 0448)      LOS: 9 days   Charlynne Cousins  [facility] Pager [phone]  *Please refer to [email or web address], password [email or web address] to get updated schedule on who will round on this patient, as hospitalists switch teams weekly. If 7PM-7AM, please contact night-coverage at [email or web address], password [email or web address] for any overnight needs.  03/27/2018, 8:15 AM

## 2018-03-27 NOTE — Transfer of Care (Signed)
Immediate Anesthesia Transfer of Care Note  Patient: Sonya [name]  Procedure(s) Performed: ENDOBRONCHIAL ULTRASOUND (Bilateral )  Patient Location: PACU  Anesthesia Type:General  Level of Consciousness: sedated  Airway & Oxygen Therapy: Patient Spontanous Breathing and Patient connected to face mask oxygen  Post-op Assessment: Report given to RN and Post -op Vital signs reviewed and stable  Post vital signs: Reviewed and stable  Last Vitals:  Vitals Value Taken Time  BP    Temp    Pulse    Resp    SpO2      Last Pain:  Vitals:   03/27/18 0453  TempSrc: Oral  PainSc:       Patients Stated Pain Goal: 0 (18/56/31 4970)  Complications: No apparent anesthesia complications

## 2018-03-27 NOTE — Care Management Note (Signed)
Case Management Note  Patient Details  Name: Sonya [name] MRN: [id number] Date of Birth: 10-04-1955  Subjective/Objective: Patient will make own appt for [facility] @ [facility] already following for [facility], nurse will check 02 sats if home 02 is needed-await documented 02 sats.                   Action/Plan:d/c home w/HHC.   Expected Discharge Date:  (unknown)               Expected Discharge Plan:  [place]  In-House Referral:     Discharge planning Services  CM Consult  Post Acute Care Choice:    Choice offered to:  Patient  DME Arranged:  [place] [facility] D[facility] Agency:  [place] Arranged:  PT, Social Work [employer] Agency:  [place]  Status of Service:  Completed, signed off  If discussed at [employer], dates discussed:    Additional Comments:  Dessa Phi, RN 03/27/2018, 12:27 PM

## 2018-03-27 NOTE — Progress Notes (Signed)
For palliative cons-await recc.

## 2018-03-27 NOTE — Progress Notes (Signed)
ANTICOAGULATION CONSULT NOTE - Follow Up Consult  Pharmacy Consult for Heparin Indication: acute bilateral UE DVT  No Known Allergies  Patient Measurements: Height: 5\' 9"  (175.3 cm) Weight: 104 lb (47.2 kg) IBW/kg (Calculated) : 66.2 Heparin Dosing Weight:   Vital Signs: Temp: 98.5 F (36.9 C) (11/07 2131) Temp Source: Oral (11/07 2131) BP: 114/55 (11/07 2131) Pulse Rate: 95 (11/07 2131)  Labs: Recent Labs    03/24/18 1020  03/24/18 1838 03/25/18 0037 03/25/18 0904 03/26/18 0624 03/27/18 0033  HGB  --    < > 9.3* 8.9*  --  9.0*  --   HCT  --   --  28.6* 27.9*  --  27.9*  --   PLT  --   --  395 416*  --  414*  --   HEPARINUNFRC  --    < > 0.54 0.44 0.34 <0.10* 0.24*  CREATININE 0.59  --   --  0.73  --   --   --    < > = values in this interval not displayed.    Estimated Creatinine Clearance: 54.3 mL/min (by C-G formula based on SCr of 0.73 mg/dL).   Medications:  Infusions:  . heparin 1,150 Units/hr (03/26/18 1840)    Assessment: Patient with low heparin level.  No heparin issues per RN.  Goal of Therapy:  Heparin level 0.3-0.7 units/ml Monitor platelets by anticoagulation protocol: Yes   Plan:  Increase heparin to 1250 units/hr Recheck level at [address] [place] 03/27/2018,2:50 AM

## 2018-03-27 NOTE — Consult Note (Signed)
Consultation Note Date: 03/27/2018   Patient Name: Sonya [name]  DOB: 1956/01/19  MRN: [id number]  Age / Sex: 62 y.o., female  PCP: Default, Provider, MD Referring Physician: Aileen Fass, Tammi Klippel, MD  Reason for Consultation: Establishing goals of care  HPI/Patient Profile: 62 y.o. female  with past medical history of lung mass who did not followup admitted on 03/17/2018 with decline and now s/p biopsy with results pending.  Palliative consulted for goals of care.   Clinical Assessment and Goals of Care: I met today with Sonya [name].  I introduced palliative care as specialized medical care for people living with serious illness. It focuses on providing relief from the symptoms and stress of a serious illness. The goal is to improve quality of life for both the patient and the family.  We discussed clinical course as well as wishes moving forward in regard to advanced directives.  Concepts specific to code status and future care/ rehospitalization discussed. Values and goals of care important to patient and family were attempted to be elicited.  She reports that the most important things to her are "living" and her family.  We discussed her understanding of her condition, but she is reluctant to engage too much in her understanding of severity of her disease.    Discussed HCPOA and naming surrogate decision maker.  She reports that she is married (husband has lived "up [place] somewhere" for years.  Also has 4 adult children.  She reports that her sister would know her best to serve as HCPOA.  Questions and concerns addressed.   PMT will continue to support holistically.  SUMMARY OF RECOMMENDATIONS   - Provided preliminary advance care planning.  We discussed difficult decisions that she will be facing moving forward, including options for treatment as well as limitations on care.  Sonya [name]  reports needing to discuss biopsy results with oncology prior to making any changes to her care plan. - We discussed process to name her sister as her HCPOA.  She will consider.  Code Status/Advance Care Planning:  Full code  Palliative Prophylaxis:   Frequent Pain Assessment  Additional Recommendations (Limitations, Scope, Preferences):  Full Scope Treatment  Psycho-social/Spiritual:   Desire for further Chaplaincy support:no  Additional Recommendations: Caregiving  Support/Resources  Prognosis:   Unable to determine  Discharge Planning: To Be Determined      Primary Diagnoses: Present on Admission: . Pulmonary mass . Hypertensive urgency . HTN (hypertension) . Lung mass . Acute deep vein thrombosis (DVT) of upper extremity ([place])   I have reviewed the medical record, interviewed the patient and family, and examined the patient. The following aspects are pertinent.  Past Medical History:  Diagnosis Date  . Atrial fibrillation ([place])   . Bronchitis   . Chronic back pain   . COPD (chronic obstructive pulmonary disease) ([place])   . HTN (hypertension)   . Mass of upper lobe of right lung   . Scoliosis    Social History   Socioeconomic History  . Marital status: Divorced  Spouse name: Not on file  . Number of children: Not on file  . Years of education: Not on file  . Highest education level: Not on file  Occupational History  . Not on file  Social Needs  . Financial resource strain: Not on file  . Food insecurity:    Worry: Not on file    Inability: Not on file  . Transportation needs:    Medical: Not on file    Non-medical: Not on file  Tobacco Use  . Smoking status: Current Every Day Smoker    Packs/day: 0.25  . Smokeless tobacco: Never Used  Substance and Sexual Activity  . Alcohol use: Not Currently  . Drug use: No  . Sexual activity: Not on file  Lifestyle  . Physical activity:    Days per week: Not on file    Minutes per session: Not on  file  . Stress: Not on file  Relationships  . Social connections:    Talks on phone: Not on file    Gets together: Not on file    Attends religious service: Not on file    Active member of club or organization: Not on file    Attends meetings of clubs or organizations: Not on file    Relationship status: Not on file  Other Topics Concern  . Not on file  Social History Narrative  . Not on file   Family History  Problem Relation Age of Onset  . Heart disease Brother    Scheduled Meds: . apixaban  10 mg Oral BID   Followed by  . [START ON 04/03/2018] apixaban  5 mg Oral BID  . budesonide (PULMICORT) nebulizer solution  0.5 mg Nebulization BID  . dextromethorphan-guaiFENesin  1 tablet Oral BID  . diltiazem  120 mg Oral Daily  . feeding supplement (ENSURE ENLIVE)  237 mL Oral BID BM  . hydrochlorothiazide  12.5 mg Oral Daily  . ipratropium  0.5 mg Nebulization TID  . levalbuterol  0.63 mg Nebulization TID  . lisinopril  20 mg Oral Daily  . multivitamin with minerals  1 tablet Oral Daily   Continuous Infusions: PRN Meds:.chlorpheniramine-HYDROcodone, hydrALAZINE, ipratropium, levalbuterol, oxyCODONE-acetaminophen Medications Prior to Admission:  Prior to Admission medications   Medication Sig Start Date End Date Taking? Authorizing Provider  ibuprofen (ADVIL,MOTRIN) 200 MG tablet Take 200-400 mg by mouth every 6 (six) hours as needed for headache or moderate pain.   Yes [provider]  Multiple Vitamin (MULTIVITAMIN WITH MINERALS) TABS tablet Take 1 tablet by mouth daily.   Yes [provider]  oxyCODONE-acetaminophen (PERCOCET/ROXICET) 5-325 MG tablet Take 1 tablet by mouth every 8 (eight) hours as needed for severe pain. Patient not taking: Reported on 03/17/2018 12/04/17   McDonald, Mia A, PA-C   No Known Allergies Review of Systems + SOB.  Otherwise denies any complaionts  Physical Exam  General: Alert, awake, in no acute distress.  HEENT: No bruits,  no goiter, no JVD Heart: Regular rate and rhythm. No murmur appreciated. Lungs: Good air movement, clear Abdomen: Soft, nontender, nondistended, positive bowel sounds.  Ext: No significant edema Skin: Warm and dry Neuro: Grossly intact, nonfocal.   Vital Signs: BP 119/66 (BP Location: Left Arm)   Pulse 87   Temp 98.8 F (37.1 C) (Oral)   Resp 18   Ht '5\' 9"'$  (1.753 m)   Wt 47.2 kg   SpO2 90%   BMI 15.36 kg/m  Pain Scale: 0-10 POSS *See Group Information*: S-Acceptable,Sleep,  easy to arouse Pain Score: 0-No pain   SpO2: SpO2: 90 % O2 Device:SpO2: 90 % O2 Flow Rate: .O2 Flow Rate (L/min): 1 L/min  IO: Intake/output summary:   Intake/Output Summary (Last 24 hours) at 03/27/2018 2229 Last data filed at 03/27/2018 1811 Gross per 24 hour  Intake 933.16 ml  Output 1200 ml  Net -266.84 ml    LBM: Last BM Date: 03/25/18 Baseline Weight: Weight: 56.7 kg Most recent weight: Weight: 47.2 kg     Palliative Assessment/Data:   Flowsheet Rows     Most Recent Value  Intake Tab  Referral Department  Hospitalist  Unit at Time of Referral  Med/Surg Unit  Palliative Care Primary Diagnosis  Cancer  Date Notified  03/26/18  Palliative Care Type  New Palliative care  Reason for referral  Clarify Goals of Care  Date of Admission  03/17/18  Date first seen by Palliative Care  03/27/18  # of days Palliative referral response time  1 Day(s)  # of days IP prior to Palliative referral  9  Clinical Assessment  Palliative Performance Scale Score  50%  Psychosocial & Spiritual Assessment  Palliative Care Outcomes  Patient/Family meeting held?  Yes  Who was at the meeting?  patient  Palliative Care Outcomes  Clarified goals of care, ACP counseling assistance      Time Total: 60 minutes Greater than 50%  of this time was spent counseling and coordinating care related to the above assessment and plan.  Signed by: Micheline Rough, MD   Please contact Palliative Medicine Team phone at  [phone] for questions and concerns.  For individual provider: See Shea Evans

## 2018-03-27 NOTE — Discharge Instructions (Signed)
Information on my medicine - ELIQUIS (apixaban)  This medication education was reviewed with me or my healthcare representative as part of my discharge preparation.   Why was Eliquis prescribed for you? Eliquis was prescribed to treat blood clots that may have been found in the veins of your legs (deep vein thrombosis) or in your lungs (pulmonary embolism) and to reduce the risk of them occurring again.  What do You need to know about Eliquis ? The starting dose is 10 mg (two 5 mg tablets) taken TWICE daily for the FIRST SEVEN (7) DAYS, then on (enter date)  04/03/2018  the dose is reduced to ONE 5 mg tablet taken TWICE daily.  Eliquis may be taken with or without food.   Try to take the dose about the same time in the morning and in the evening. If you have difficulty swallowing the tablet whole please discuss with your pharmacist how to take the medication safely.  Take Eliquis exactly as prescribed and DO NOT stop taking Eliquis without talking to the doctor who prescribed the medication.  Stopping may increase your risk of developing a new blood clot.  Refill your prescription before you run out.  After discharge, you should have regular check-up appointments with your healthcare provider that is prescribing your Eliquis.    What do you do if you miss a dose? If a dose of ELIQUIS is not taken at the scheduled time, take it as soon as possible on the same day and twice-daily administration should be resumed. The dose should not be doubled to make up for a missed dose.  Important Safety Information A possible side effect of Eliquis is bleeding. You should call your healthcare provider right away if you experience any of the following: ? Bleeding from an injury or your nose that does not stop. ? Unusual colored urine (red or dark brown) or unusual colored stools (red or black). ? Unusual bruising for unknown reasons. ? A serious fall or if you hit your head (even if there is no  bleeding).  Some medicines may interact with Eliquis and might increase your risk of bleeding or clotting while on Eliquis. To help avoid this, consult your healthcare provider or pharmacist prior to using any new prescription or non-prescription medications, including herbals, vitamins, non-steroidal anti-inflammatory drugs (NSAIDs) and supplements.  This website has more information on Eliquis (apixaban): http://www.eliquis.com/eliquis/home

## 2018-03-27 NOTE — Progress Notes (Signed)
SATURATION QUALIFICATIONS: (This note is used to comply with regulatory documentation for home oxygen)  Patient Saturations on Room Air at Rest = 94%  Patient Saturations on Room Air while Ambulating = 97%  Patient Saturations on 0 Liters of oxygen while Ambulating = 97%  Please briefly explain why patient needs home oxygen:

## 2018-03-27 NOTE — Progress Notes (Signed)
Eliquis is on preferred medicaid med list.

## 2018-03-27 NOTE — Consult Note (Signed)
NAME:  Sonya [name], MRN:  [id number], DOB:  [birth year], LOS: 9 ADMISSION DATE:  03/17/2018, CONSULTATION DATE: 03/25/2018 REFERRING MD:  Aileen Fass CHIEF COMPLAINT: Right upper lobe lung mass  Brief History   Ms. Sonya [name] is a 62 year old female active smoker with right lung mass (known since 12/04/2017) presents with 2-week history of nonproductive cough, shortness of breath and weight loss.  CTA with progressive large right upper lobe lung mass invading/compressing pulmonary vasculature.  CT-guided biopsy attempted however no tissue obtained. Pulmonary consulted for diagnostic bronchoscopy with EBUS. Underwent EBUS 11/8 with sampling of right hilar mass and lymph node stations: 11L and 7.  Past Medical History  Right suprahilar lung mass, significant tobacco history, hypertension, atrial fibrillation Significant Hospital Events   10/30-CT-guided biopsy Consults: date of consult/date signed off & final recs:  03/25/18  Procedures (surgical and bedside):  N/A  Significant Diagnostic Tests:  N/A  Micro Data:  N/A  Antimicrobials:  N/A  Subjective:  S/p EBUS on 11/8. Denies fevers, chills, chest pain, hemoptysis.  Objective   Blood pressure 128/81, pulse 79, temperature 98.5 F (36.9 C), temperature source Oral, resp. rate 16, height 5\' 9"  (1.753 m), weight 47.2 kg, SpO2 95 %.        Intake/Output Summary (Last 24 hours) at 03/27/2018 1030 Last data filed at 03/27/2018 0838 Gross per 24 hour  Intake 2621.31 ml  Output 1000 ml  Net 1621.31 ml   Filed Weights   03/17/18 1612 03/17/18 2131  Weight: 56.7 kg 47.2 kg   Physical Exam: General: Well-appearing, no acute distress HENT: [place], AT, OP clear, MMM Eyes: EOMI, no scleral icterus Lymph: no palpable cervical lymphadenopathy Respiratory: Decreased right upper lobe breath sounds, Clear to auscultation bilaterally.  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  Reviewed CT chest from 11/2017 with large right upper lobe/suprahilar mass involving right upper lobe bronchus and right upper lobe pulmonary artery.  CT on this admission reviewed and shows progression of this mass now involving invasion/compression of right middle lobe pulmonary artery. S/p EBUS 03/27/18 with no post-op complications.   Plan: Follow-up final cytology Consider oncology consult Pulmonary will sign off  Rodman Pickle, M.D. [facility] Pulmonary/Critical Care Medicine Pager: [phone] After hours pager: [phone]  Disposition / Summary of Today's Plan 03/27/18   Pulmonary will sign off  Labs   CBC: Recent Labs  Lab 03/21/18 0437 03/22/18 0525 03/24/18 1838 03/25/18 0037 03/26/18 0624 03/27/18 0550  WBC 16.0* 12.8* 14.1* 12.0* 9.7 12.5*  NEUTROABS 15.2* 11.0*  --   --   --   --   HGB 8.7* 9.5* 9.3* 8.9* 9.0* 8.7*  HCT 26.9* 29.2* 28.6* 27.9* 27.9* 27.5*  MCV 94.1 93.0 94.1 94.3 94.3 96.5  PLT 338 384 395 416* 414* 947    Basic Metabolic Panel: Recent Labs  Lab 03/21/18 0437 03/22/18 0525 03/23/18 0521 03/24/18 1020  03/25/18 0037 03/27/18 0550  NA 135 135 138 137 138 135  K 4.1 3.2* 3.7 3.9 3.8 4.0  CL 102 99 102 100 100 99  CO2 25 28 28 28 31 26   GLUCOSE 198* 112* 93 99 109* 158*  BUN 17 15 19 23  26*  26*  CREATININE 0.56 0.43* 0.57 0.59 0.73 0.83  CALCIUM 10.0 8.8* 8.4* 8.9 8.7* 8.4*  MG 1.7 2.5*  --   --   --   --   PHOS 2.0*  --   --   --   --   --    GFR: Estimated Creatinine Clearance: 52.4 mL/min (by C-G formula based on SCr of 0.83 mg/dL). Recent Labs  Lab 03/24/18 1838 03/25/18 0037 03/26/18 0624 03/27/18 0550  WBC 14.1* 12.0* 9.7 12.5*    Liver Function Tests: Recent Labs  Lab 03/21/18 0437 03/24/18 1020 03/25/18 0037  AST 18 21 17   ALT 11 17 15   ALKPHOS 60 52 50  BILITOT 0.2* 0.5 0.5  PROT 7.3 7.4 7.2  ALBUMIN 2.5* 2.7* 2.9*   No results for input(s): LIPASE, AMYLASE in the last 168 hours. No results for input(s): AMMONIA in the last 168 hours.  ABG    Component Value Date/Time   TCO2 26 06/13/2015 1600     Coagulation Profile: No results for input(s): INR, PROTIME in the last 168 hours.  Cardiac Enzymes: Recent Labs  Lab 03/20/18 1206 03/20/18 2323  TROPONINI <0.03 <0.03    HbA1C: No results found for: HGBA1C  CBG: No results for input(s): GLUCAP in the last 168 hours.

## 2018-03-28 DIAGNOSIS — D6869 Other thrombophilia: Secondary | ICD-10-CM

## 2018-03-28 LAB — BASIC METABOLIC PANEL
Anion gap: 9 (ref 5–15)
BUN: 18 mg/dL (ref 8–23)
CO2: 28 mmol/L (ref 22–32)
Calcium: 8.6 mg/dL — ABNORMAL LOW (ref 8.9–10.3)
Chloride: 95 mmol/L — ABNORMAL LOW (ref 98–111)
Creatinine, Ser: 0.72 mg/dL (ref 0.44–1.00)
GFR calc Af Amer: >60 mL/min (ref >60)
GFR calc non Af Amer: >60 mL/min (ref >60)
Glucose, Bld: 111 mg/dL — ABNORMAL HIGH (ref 70–99)
POTASSIUM: 3.7 mmol/L (ref 3.5–5.1)
Sodium: 132 mmol/L — ABNORMAL LOW (ref 135–145)

## 2018-03-28 LAB — CBC
HEMATOCRIT: 26.1 % — AB (ref 36.0–46.0)
HEMOGLOBIN: 8.5 g/dL — AB (ref 12.0–15.0)
MCH: 30.7 pg (ref 26.0–34.0)
MCHC: 32.6 g/dL (ref 30.0–36.0)
MCV: 94.2 fL (ref 80.0–100.0)
Platelets: 403 10*3/uL — ABNORMAL HIGH (ref 150–400)
RBC: 2.77 MIL/uL — AB (ref 3.87–5.11)
RDW: 14.5 % (ref 11.5–15.5)
WBC: 11.5 10*3/uL — ABNORMAL HIGH (ref 4.0–10.5)
nRBC: 0 % (ref 0.0–0.2)

## 2018-03-28 MED ORDER — ENSURE ENLIVE PO LIQD: 237.0000 mL | Freq: Two times a day (BID) | ORAL | 12 refills | Status: AC

## 2018-03-28 MED ORDER — HYDRALAZINE HCL 20 MG/ML IJ SOLN: 10.0000 mg | Freq: Four times a day (QID) | INTRAMUSCULAR | Status: DC | PRN

## 2018-03-28 MED ORDER — APIXABAN 5 MG PO TABS: 5.0000 mg | ORAL_TABLET | Freq: Two times a day (BID) | ORAL | 0 refills | Status: AC

## 2018-03-28 MED ORDER — HYDROCHLOROTHIAZIDE 12.5 MG PO CAPS: 12.5000 mg | ORAL_CAPSULE | Freq: Every day | ORAL | 0 refills | Status: DC

## 2018-03-28 MED ORDER — DILTIAZEM HCL ER COATED BEADS 120 MG PO CP24: 120.0000 mg | ORAL_CAPSULE | Freq: Every day | ORAL | 0 refills | Status: DC

## 2018-03-28 MED ORDER — APIXABAN 5 MG PO TABS: 10.0000 mg | ORAL_TABLET | Freq: Two times a day (BID) | ORAL | 0 refills | Status: DC

## 2018-03-28 NOTE — Discharge Summary (Signed)
Physician Discharge Summary  Catherene Kaleta POE:[id number] DOB: Feb 16, 1956 DOA: 03/17/2018  PCP: Default, Provider, MD  Admit date: 03/17/2018 Discharge date: 03/28/2018  Admitted From: home Disposition:  Home  Recommendations for Outpatient Follow-up:  1. Follow up with Oncology in 1-2 weeks  Home Health:No Equipment/Devices:none  Discharge Condition:guarded CODE STATUS:Full Diet recommendation: Heart Healthy  Brief/Interim Summary: 62 y.o. female past medical history significant for right lung mass who presented to the hospital for shortness of breath on December 04, 2017 with a CT chest that revealed a large right suprahilar mass with involvement of the right upper lobe bronchus and SVC during this time she was offered admission and further work-up and she declined.  She did not follow-up with her primary care doctor when discharge in July she continued to smoke, she relates she comes back today to the hospital feeling short of breath on exertion and when laying down also having a productive cough with progressive symptoms repeated CTA does not show a PE does show increased vascular invasion mass-effect by the suprahilar mass resulting in new occlusion of the right middle lobe and persistent right upper lobe arterial occlusion as well as superior vena cava, oncology was consulted who recommended IR to perform a CT-guided biopsy which was unsuccessful.  Pulmonary and critical care was consulted for EBUS.,  He was also noted to be in COPD exacerbation and placed on steroids antibiotics and inhalers.  On 03/20/2018 she developed new onset atrial fibrillation.  Discharge Diagnoses:  Principal Problem:   Pulmonary mass Active Problems:   Chronic anemia   Hypercalcemia   Hypertensive urgency   HTN (hypertension)   Lung mass   Occlusion of pulmonary artery (HCC)   COPD with acute exacerbation (HCC)   Tachycardia   Atrial fibrillation with RVR (HCC)   New onset atrial fibrillation  (HCC)   Acute deep vein thrombosis (DVT) of upper extremity (HCC)   Acute deep vein thrombosis (DVT) of axillary vein of left upper extremity (HCC)   Acute deep vein thrombosis (DVT) of axillary vein of right upper extremity (HCC)   Hypercoagulable state, secondary ([place])   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 Pulmonary and critical care was consulted and perform an ED press on 03/26/2018 which pulmonary reports are concerning for non-small cell cancer. Oncology was consulted recommended to follow-up with them as an outpatient. Hospice and palliative care was consulted and before making any new decisions the patient would like to meet with oncology with a full pathologic report before making any decisions.  Malignant hypercalcemia: He was treated with IV fluids immediate calcitonin and Lasix now calcium is stable.  Bilateral upper extremity clots she was started on IV heparin.  New onset atrial fibrillation:  Seen on admission she converted to sinus rhythm on 03/20/2018 her chads vas score is 2. She was placed on IV Cardizem then switched to Cardizem orally. She will continue Eliquis as an outpatient.  Probable COPD: She has completed her treatment in house.  Chronic anemia: Random level was 14 ferritin was 103 she received IV iron.  Hypertensive urgency: She was started on low-dose hydrochlorothiazide and Cardizem she will continue these as an outpatient.  Hypokalemia: This was repleted orally.   Discharge Instructions  Discharge Instructions    Diet - low sodium heart healthy   Complete by:  As directed    Increase activity slowly  Complete by:  As directed      Allergies as of 03/28/2018   No Known Allergies     Medication List    STOP taking these medications   ibuprofen 200 MG tablet Commonly  known as:  ADVIL,MOTRIN   multivitamin with minerals Tabs tablet     TAKE these medications   apixaban 5 MG Tabs tablet Commonly known as:  ELIQUIS Take 2 tablets (10 mg total) by mouth 2 (two) times daily for 6 days.   apixaban 5 MG Tabs tablet Commonly known as:  ELIQUIS Take 1 tablet (5 mg total) by mouth 2 (two) times daily. Start taking on:  04/03/2018   diltiazem 120 MG 24 hr capsule Commonly known as:  CARDIZEM CD Take 1 capsule (120 mg total) by mouth daily.   feeding supplement (ENSURE ENLIVE) Liqd Take 237 mLs by mouth 2 (two) times daily between meals.   hydrALAZINE 20 MG/ML injection Commonly known as:  APRESOLINE Inject 0.5 mLs (10 mg total) into the vein every 6 (six) hours as needed (SBP > 180 OR DBP > 110).   hydrochlorothiazide 12.5 MG capsule Commonly known as:  MICROZIDE Take 1 capsule (12.5 mg total) by mouth daily.   oxyCODONE-acetaminophen 5-325 MG tablet Commonly known as:  PERCOCET/ROXICET Take 1 tablet by mouth every 8 (eight) hours as needed for severe pain.            Durable Medical Equipment  (From admission, onward)         Start     Ordered   03/23/18 1247  For home use only DME 4 wheeled rolling walker with seat  Once    Question:  Patient needs a walker to treat with the following condition  Answer:  Debility   03/23/18 1246   03/21/18 1540  For home use only DME Tub bench  Once     03/21/18 1539         Follow-up Information    Primary Care at [facility]. Call.   Specialty:  Family Medicine Why:  please call to schedule an appointment with a primary care provider. Contact information: [address], [place] [phone]       [facility] Follow up.   Specialty:  Home Health Services Why:  [facility] physical therapy/social worker Contact information: [address] [place] [address] [phone]        [place] Follow up.    Why:  rolling walker Contact information: [address] [place] [address] [phone]          No Known Allergies  Consultations:  Pulmonary critical care   Hospice and palliative care    Procedures/Studies: Dg Chest 2 View  Result Date: 03/17/2018 CLINICAL DATA:  Shortness of breath EXAM: CHEST - 2 VIEW COMPARISON:  Chest CT 12/04/2017 FINDINGS: Consolidation of the right upper lobe consistent with known upper lobe mass and likely postobstructive atelectasis. No pleural effusion. Heart size normal. The left lung is clear. IMPRESSION: Known right suprahilar mass with associated upper lobe atelectasis. Lungs are otherwise clear. Electronically Signed   By: Ulyses Jarred M.D.   On: 03/17/2018 18:01   Ct Angio Chest Pe W And/or Wo Contrast  Result Date: 03/17/2018 CLINICAL DATA:  Shortness of breath and back pain. Known right lung mass. EXAM: CT ANGIOGRAPHY CHEST CT ABDOMEN AND PELVIS WITH CONTRAST TECHNIQUE: Multidetector CT imaging of the chest was performed using the standard  protocol during bolus administration of intravenous contrast. Multiplanar CT image reconstructions and MIPs were obtained to evaluate the vascular anatomy. Multidetector CT imaging of the abdomen and pelvis was performed using the standard protocol during bolus administration of intravenous contrast. CONTRAST:  15mL ISOVUE-370 IOPAMIDOL (ISOVUE-370) INJECTION 76% COMPARISON:  Chest CT 12/04/2017 FINDINGS: CTA CHEST FINDINGS Cardiovascular: Contrast injection is sufficient to demonstrate satisfactory opacification of the pulmonary arteries to the segmental level. There is no pulmonary embolus. The main pulmonary artery is within normal limits for size. 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 The inter lobar artery is now also occluded. There is calcific aortic atherosclerosis. No dissection. There is a normal variant aortic  arch branching pattern with the brachiocephalic and left common carotid arteries sharing a common origin. Heart size is normal, without pericardial effusion. There are large paraspinal venous collaterals. Mediastinum/Nodes: Right-sided mediastinal fat is effaced by the low right suprahilar mass. Multiple subcentimeter bilateral axillary lymph nodes. Lungs/Pleura: There is debris within the right mainstem bronchus and bronchus intermedius. Right upper lobe bronchus is occluded. There is a large suprahilar right lung mass that appears unchanged in size. The mass is difficult to separate from the surrounding lung. 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 Increased confluent nodularity in the left lower lobe. Clustered nodules in the left upper lobe have increased in size, measuring up to 12 mm. Musculoskeletal: No chest wall abnormality. No acute or significant osseous findings. Review of the MIP images confirms the above findings. CT ABDOMEN and PELVIS FINDINGS Hepatobiliary: Normal hepatic contours and density. No visible biliary dilatation. Normal gallbladder. Pancreas: Normal contours without ductal dilatation. No peripancreatic fluid collection. Spleen: Normal. Adrenals/Urinary Tract: Normal adrenal glands. 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 Stomach/Bowel: Matted bowel in the lower abdomen and pelvis. No dilatation or inflammation visualized. Vascular/Lymphatic: Atherosclerotic calcification is present within the non-aneurysmal abdominal aorta, without hemodynamically significant stenosis. Major abdominal veins are patent. No abdominal or pelvic lymphadenopathy. Reproductive: There are multiple enhancing nodules within the posterior left pelvis. Difficult to accurately localize these lesions due to crowding pelvic structures, but these may be fibroids within the uterine wall. Musculoskeletal. No bony spinal canal stenosis or focal osseous abnormality. Other: None.  IMPRESSION: 1. 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 2. Debris within the right mainstem bronchus. Right upper lobe atelectasis. 3. No acute abnormality of the abdomen or pelvis. Matted appearance of the small bowel and colon without dilatation or inflammation. 4. Enhancing nodular foci along the posterior left pelvis. Exact location is not entirely clear, but these are suspected to be subserosal fibroids of the left uterine fundus. Aortic Atherosclerosis (ICD10-I70.0). Electronically Signed   By: Ulyses Jarred M.D.   On: 03/17/2018 19:14   Mr Brain Wo Contrast  Result Date: 03/18/2018 CLINICAL DATA:  Status post lung mass biopsy, lung cancer. EXAM: MRI HEAD WITHOUT CONTRAST TECHNIQUE: Multiplanar, multiecho pulse sequences of the brain and surrounding structures were obtained without intravenous contrast. Due to cough and motion, patient was unable to tolerate further imaging and postcontrast images not obtained. COMPARISON:  CT HEAD June 13, 2015 FINDINGS: Variable motion degraded examination, severely motion degraded axial MPGR and axial T1. INTRACRANIAL CONTENTS: No reduced diffusion to suggest  acute ischemia or hypercellular tumor. No susceptibility artifact to suggest hemorrhage though sequence is severely motion degraded. The ventricles and sulci are normal for patient's age. Patchy supratentorial and pontine white matter FLAIR T2 hyperintensities. No suspicious parenchymal signal, masses, mass effect. No abnormal extra-axial fluid collections. No extra-axial masses. VASCULAR: Normal major intracranial vascular flow voids present at skull base. SKULL AND UPPER CERVICAL SPINE: No abnormal sellar expansion. No suspicious calvarial bone marrow signal.  Craniocervical junction maintained. SINUSES/ORBITS: Mild sphenoid sinus mucosal thickening. Mastoid air cells are well aerated. Included ocular globes and orbital contents are non-suspicious. OTHER: None. IMPRESSION: 1. Motion degraded examination without acute intracranial process. No identified intracranial metastasis though postcontrast sequences would be more sensitive. 2. Mild-to-moderate chronic small vessel ischemic changes. Electronically Signed   By: Elon Alas M.D.   On: 03/18/2018 20:55   Ct Abdomen Pelvis W Contrast  Result Date: 03/17/2018 CLINICAL DATA:  Shortness of breath and back pain. Known right lung mass. EXAM: CT ANGIOGRAPHY CHEST CT ABDOMEN AND PELVIS WITH CONTRAST TECHNIQUE: Multidetector CT imaging of the chest was performed using the standard protocol during bolus administration of intravenous contrast. Multiplanar CT image reconstructions and MIPs were obtained to evaluate the vascular anatomy. Multidetector CT imaging of the abdomen and pelvis was performed using the standard protocol during bolus administration of intravenous contrast. CONTRAST:  122mL ISOVUE-370 IOPAMIDOL (ISOVUE-370) INJECTION 76% COMPARISON:  Chest CT 12/04/2017 FINDINGS: CTA CHEST FINDINGS Cardiovascular: Contrast injection is sufficient to demonstrate satisfactory opacification of the pulmonary arteries to the segmental level. There is no pulmonary embolus. The main pulmonary artery is within normal limits for size. 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 The inter lobar artery is now also occluded. There is calcific aortic atherosclerosis. No dissection. There is a normal variant aortic arch branching pattern with the brachiocephalic and left common carotid arteries sharing a common origin. Heart size is normal, without pericardial effusion. There are large paraspinal venous collaterals. Mediastinum/Nodes: Right-sided  mediastinal fat is effaced by the low right suprahilar mass. Multiple subcentimeter bilateral axillary lymph nodes. Lungs/Pleura: There is debris within the right mainstem bronchus and bronchus intermedius. Right upper lobe bronchus is occluded. There is a large suprahilar right lung mass that appears unchanged in size. The mass is difficult to separate from the surrounding lung. 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 Increased confluent nodularity in the left lower lobe. Clustered nodules in the left upper lobe have increased in size, measuring up to 12 mm. Musculoskeletal: No chest wall abnormality. No acute or significant osseous findings. Review of the MIP images confirms the above findings. CT ABDOMEN and PELVIS FINDINGS Hepatobiliary: Normal hepatic contours and density. No visible biliary dilatation. Normal gallbladder. Pancreas: Normal contours without ductal dilatation. No peripancreatic fluid collection. Spleen: Normal. Adrenals/Urinary Tract: Normal adrenal glands. 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 Stomach/Bowel: Matted bowel in the lower abdomen and pelvis. No dilatation or inflammation visualized. Vascular/Lymphatic: Atherosclerotic calcification is present within the non-aneurysmal abdominal aorta, without hemodynamically significant stenosis. Major abdominal veins are patent. No abdominal or pelvic lymphadenopathy. Reproductive: There are multiple enhancing nodules within the posterior left pelvis. Difficult to accurately localize these lesions due to crowding pelvic structures, but these may be fibroids within the uterine wall. Musculoskeletal. No bony spinal canal stenosis or focal osseous abnormality. Other: None. IMPRESSION: 1. No pulmonary embolus; however, increased vascular invasion and/or mass effect by the right suprahilar mass, resulting in  new occlusion of the right middle lobe pulmonary artery and persistent right upper lobe pulmonary  artery occlusion as well as occlusion/invasion of the superior vena cava. 2. Debris within the right mainstem bronchus. Right upper lobe atelectasis. 3. No acute abnormality of the abdomen or pelvis. Matted appearance of the small bowel and colon without dilatation or inflammation. 4. Enhancing nodular foci along the posterior left pelvis. Exact location is not entirely clear, but these are suspected to be subserosal fibroids of the left uterine fundus. Aortic Atherosclerosis (ICD10-I70.0). Electronically Signed   By: Ulyses Jarred M.D.   On: 03/17/2018 19:14   Ct Biopsy  Result Date: 03/19/2018 INDICATION: 62 year old female with a history right upper lobe tumor. She has been referred for biopsy. EXAM: CT BIOPSY MEDICATIONS: None. ANESTHESIA/SEDATION: Moderate (conscious) sedation was employed during this procedure. A total of Versed 1.0 mg and Fentanyl 50 mcg was administered intravenously. Moderate Sedation Time: 10 minutes. The patient's level of consciousness and vital signs were monitored continuously by radiology nursing throughout the procedure under my direct supervision. FLUOROSCOPY TIME:  CT COMPLICATIONS: None PROCEDURE: The procedure, risks, benefits, and alternatives were explained to the patient and the patient's family. Specific risks that were addressed included bleeding, infection, pneumothorax, need for further procedure including chest tube placement, chance of delayed pneumothorax or hemorrhage, hemoptysis, nondiagnostic sample, cardiopulmonary collapse, death. Questions regarding the procedure were encouraged and answered. The patient understands and consents to the procedure. Patient was positioned in the supine position on the CT gantry table and a scout CT of the chest was performed for planning purposes. Once angle of approach was determined, the skin and subcutaneous tissues this scan was prepped and draped in the usual sterile fashion, and a sterile drape was applied covering the  operative field. A sterile gown and sterile gloves were used for the procedure. Local anesthesia was provided with 1% Lidocaine. The skin and subcutaneous tissues were infiltrated 1% lidocaine for local anesthesia, and a small stab incision was made with an 11 blade scalpel. Using CT guidance, a 17 gauge trocar needle was advanced into the safest aspect of the right upper lobetarget. After confirmation of the tip, the stylet was withdrawn. Immediately serous fluid returned, tidling with the breathing. A single 18 gauge core biopsy was attempted with no tissue. At this time we withdrew from the case and removed the needle. Patient tolerated the procedure well and remained hemodynamically stable throughout. No complications were encountered and no significant blood loss was encounter IMPRESSION: Status post CT-guided attempt at right upper lobe mass biopsy. The central portion of the tumor was avoided given the high risk, and the peripheral aspect of the tumor was avoided given the high likelihood of atelectatic/postobstructive lung. In the absence of a correlative PET-CT, the needle was placed in the central/safest portion of the tumor, with discovery of frankly serous fluid/cyst. No sample was achieved. Signed, Dulcy Fanny. Dellia Nims, RPVI [facility] Radiology Electronically Signed   By: Corrie Mckusick D.O.   On: 03/19/2018 08:13   Dg Chest Port 1 View  Result Date: 03/18/2018 CLINICAL DATA:  Productive cough with clear mucus. Smoker since age 74. Lung mass. EXAM: PORTABLE CHEST 1 VIEW COMPARISON:  03/17/2018 CT and CXR FINDINGS: Unchanged pulmonary mass occupying much of the right upper thorax. Emphysematous hyperinflation of. Heart and mediastinal contours to the extent visualized appear stable. No aggressive osseous lesions. IMPRESSION: Redemonstration of right upper pulmonary mass in the setting of COPD. No significant change.  Electronically Signed   By: Ashley Royalty M.D.   On: 03/18/2018 20:44   Vas [place] Upper Extremity Venous Duplex  Result Date: 03/24/2018 UPPER VENOUS STUDY  Indications: Swelling Performing Technologist: Abram Sander  Examination Guidelines: A complete evaluation includes B-mode imaging, spectral Doppler, color Doppler, and power Doppler as needed of all accessible portions of each vessel. Bilateral testing is considered an integral part of a complete examination. Limited examinations for reoccurring indications may be performed as noted.  Right Findings: +----------+------------+----------+---------+-----------+-------+ RIGHT     CompressiblePropertiesPhasicitySpontaneousSummary +----------+------------+----------+---------+-----------+-------+ IJV           None                 No        No             +----------+------------+----------+---------+-----------+-------+ Subclavian    None                 No        No             +----------+------------+----------+---------+-----------+-------+ Axillary      None                 No        No             +----------+------------+----------+---------+-----------+-------+ Brachial    Partial                                         +----------+------------+----------+---------+-----------+-------+ Radial        Full                                          +----------+------------+----------+---------+-----------+-------+ Ulnar         Full                                          +----------+------------+----------+---------+-----------+-------+ Cephalic      Full                                          +----------+------------+----------+---------+-----------+-------+ Basilic       None                                          +----------+------------+----------+---------+-----------+-------+  Left Findings: +----------+------------+----------+---------+-----------+-------+ LEFT      CompressiblePropertiesPhasicitySpontaneousSummary  +----------+------------+----------+---------+-----------+-------+ IJV           None                 No        No             +----------+------------+----------+---------+-----------+-------+ Subclavian    Full                 Yes       Yes            +----------+------------+----------+---------+-----------+-------+ Axillary  Yes       Yes            +----------+------------+----------+---------+-----------+-------+ Brachial    Partial                                         +----------+------------+----------+---------+-----------+-------+ Radial        Full                                          +----------+------------+----------+---------+-----------+-------+ Ulnar         Full                                          +----------+------------+----------+---------+-----------+-------+ Cephalic      Full                                          +----------+------------+----------+---------+-----------+-------+ Basilic       Full                                          +----------+------------+----------+---------+-----------+-------+  Summary:  Right: Findings consistent with acute deep vein thrombosis involving the right internal jugular veins, right subclavian veins, right axillary vein and right brachial veins.  Left: Findings consistent with acute deep vein thrombosis involving the left internal jugular veins and left brachial veins.  *See table(s) above for measurements and observations.  Diagnosing physician: Harold Barban MD Electronically signed by Harold Barban MD on 03/24/2018 at 6:55:33 PM.    Final      Subjective: No complains  Discharge Exam: Vitals:   03/27/18 2047 03/28/18 0430  BP: 119/66 118/63  Pulse: 87 85  Resp: 18 18  Temp: 98.8 F (37.1 C) 99.2 F (37.3 C)  SpO2: 90% 95%   Vitals:   03/27/18 1610 03/27/18 2005 03/27/18 2047 03/28/18 0430  BP:   119/66 118/63  Pulse:   87 85  Resp:   18  18  Temp:   98.8 F (37.1 C) 99.2 F (37.3 C)  TempSrc:   Oral Oral  SpO2: 94% 99% 90% 95%  Weight:      Height:        General: Pt is alert, awake, not in acute distress Cardiovascular: RRR, S1/S2 +, no rubs, no gallops Respiratory: CTA bilaterally, no wheezing, no rhonchi Abdominal: Soft, NT, ND, bowel sounds + Extremities: no edema, no cyanosis    The results of significant diagnostics from this hospitalization (including imaging, microbiology, ancillary and laboratory) are listed below for reference.     Microbiology: No results found for this or any previous visit (from the past 240 hour(s)).   Labs: BNP (last 3 results) No results for input(s): BNP in the last 8760 hours. Basic Metabolic Panel: Recent Labs  Lab 03/22/18 0525 03/23/18 0521 03/24/18 1020 03/25/18 0037 03/27/18 0550 03/28/18 0501  NA 135 138 137 138 135 132*  K 3.2* 3.7 3.9 3.8 4.0 3.7  CL 99 102 100 100 99 95*  CO2 28 28 28  31 26 28   GLUCOSE 112* 93 99 109* 158* 111*  BUN 15 19 23  26* 26* 18  CREATININE 0.43* 0.57 0.59 0.73 0.83 0.72  CALCIUM 8.8* 8.4* 8.9 8.7* 8.4* 8.6*  MG 2.5*  --   --   --   --   --    Liver Function Tests: Recent Labs  Lab 03/24/18 1020 03/25/18 0037  AST 21 17  ALT 17 15  ALKPHOS 52 50  BILITOT 0.5 0.5  PROT 7.4 7.2  ALBUMIN 2.7* 2.9*   No results for input(s): LIPASE, AMYLASE in the last 168 hours. No results for input(s): AMMONIA in the last 168 hours. CBC: Recent Labs  Lab 03/22/18 0525 03/24/18 1838 03/25/18 0037 03/26/18 0624 03/27/18 0550 03/28/18 0501  WBC 12.8* 14.1* 12.0* 9.7 12.5* 11.5*  NEUTROABS 11.0*  --   --   --   --   --   HGB 9.5* 9.3* 8.9* 9.0* 8.7* 8.5*  HCT 29.2* 28.6* 27.9* 27.9* 27.5* 26.1*  MCV 93.0 94.1 94.3 94.3 96.5 94.2  PLT 384 395 416* 414* 400 403*   Cardiac Enzymes: No results for input(s): CKTOTAL, CKMB, CKMBINDEX, TROPONINI in the last 168 hours. BNP: Invalid input(s): POCBNP CBG: No results for input(s):  GLUCAP in the last 168 hours. D-Dimer No results for input(s): DDIMER in the last 72 hours. Hgb A1c No results for input(s): HGBA1C in the last 72 hours. Lipid Profile No results for input(s): CHOL, HDL, LDLCALC, TRIG, CHOLHDL, LDLDIRECT in the last 72 hours. Thyroid function studies No results for input(s): TSH, T4TOTAL, T3FREE, THYROIDAB in the last 72 hours.  Invalid input(s): FREET3 Anemia work up No results for input(s): VITAMINB12, FOLATE, FERRITIN, TIBC, IRON, RETICCTPCT in the last 72 hours. Urinalysis No results found for: COLORURINE, APPEARANCEUR, LABSPEC, [place], GLUCOSEU, HGBUR, BILIRUBINUR, KETONESUR, PROTEINUR, UROBILINOGEN, NITRITE, LEUKOCYTESUR Sepsis Labs Invalid input(s): PROCALCITONIN,  WBC,  LACTICIDVEN Microbiology No results found for this or any previous visit (from the past 240 hour(s)).   Time coordinating discharge: 40 minutes  SIGNED:   Charlynne Cousins, MD  [facility] 03/28/2018, 8:29 AM Pager   If 7PM-7AM, please contact night-coverage [email or web address] Password [email or web address]

## 2018-03-29 ENCOUNTER — Encounter (HOSPITAL_COMMUNITY): Payer: Self-pay | Admitting: Pulmonary Disease

## 2018-03-30 ENCOUNTER — Encounter: Payer: Self-pay | Admitting: Internal Medicine

## 2018-03-30 ENCOUNTER — Telehealth: Payer: Self-pay | Admitting: Internal Medicine

## 2018-03-30 ENCOUNTER — Telehealth: Payer: Self-pay

## 2018-03-30 NOTE — Telephone Encounter (Signed)
Cld to reschedule the pt's appt to see Dr. Julien Nordmann on 11/26 at 215pm. Ift the appt date and time on the pt's vm. Letter mailed.

## 2018-03-30 NOTE — Telephone Encounter (Signed)
I have never seen the patient before, therefore I will not be able to prescribed anything to the patient.

## 2018-03-30 NOTE — Telephone Encounter (Signed)
Called number on file for patient & was advised to call her sister [phone]). Left VM on sister's phone.

## 2018-03-30 NOTE — Telephone Encounter (Signed)
Please advise 

## 2018-03-30 NOTE — Telephone Encounter (Signed)
Patient sister called stating that sister was released from ED and would like some pain medications, patient has appointment 11/14. Please follow up.

## 2018-03-30 NOTE — Telephone Encounter (Signed)
Patient's sister notified of the below message.

## 2018-04-01 ENCOUNTER — Telehealth: Payer: Self-pay | Admitting: Pulmonary Disease

## 2018-04-01 NOTE — Telephone Encounter (Signed)
Attempted to call patient x 2. No answer. Left call back number. Per appt, patient is scheduled for oncology on 04/14/18.

## 2018-04-02 ENCOUNTER — Encounter: Payer: Self-pay | Admitting: *Deleted

## 2018-04-02 ENCOUNTER — Telehealth: Payer: Self-pay | Admitting: *Deleted

## 2018-04-02 ENCOUNTER — Encounter: Payer: Self-pay | Admitting: Family Medicine

## 2018-04-02 ENCOUNTER — Other Ambulatory Visit: Payer: Self-pay | Admitting: *Deleted

## 2018-04-02 ENCOUNTER — Encounter: Payer: Self-pay | Admitting: Radiation Oncology

## 2018-04-02 ENCOUNTER — Ambulatory Visit (INDEPENDENT_AMBULATORY_CARE_PROVIDER_SITE_OTHER): Payer: Medicaid Other | Admitting: Family Medicine

## 2018-04-02 VITALS — BP 109/70 | HR 98 | Resp 18 | Ht 64.0 in | Wt 100.0 lb

## 2018-04-02 DIAGNOSIS — Z09 Encounter for follow-up examination after completed treatment for conditions other than malignant neoplasm: Secondary | ICD-10-CM

## 2018-04-02 DIAGNOSIS — R918 Other nonspecific abnormal finding of lung field: Secondary | ICD-10-CM

## 2018-04-02 DIAGNOSIS — J441 Chronic obstructive pulmonary disease with (acute) exacerbation: Secondary | ICD-10-CM | POA: Diagnosis not present

## 2018-04-02 DIAGNOSIS — R627 Adult failure to thrive: Secondary | ICD-10-CM

## 2018-04-02 DIAGNOSIS — I4891 Unspecified atrial fibrillation: Secondary | ICD-10-CM

## 2018-04-02 DIAGNOSIS — I871 Compression of vein: Secondary | ICD-10-CM

## 2018-04-02 DIAGNOSIS — Z7689 Persons encountering health services in other specified circumstances: Secondary | ICD-10-CM | POA: Diagnosis not present

## 2018-04-02 DIAGNOSIS — R634 Abnormal weight loss: Secondary | ICD-10-CM

## 2018-04-02 DIAGNOSIS — I959 Hypotension, unspecified: Secondary | ICD-10-CM

## 2018-04-02 DIAGNOSIS — C3491 Malignant neoplasm of unspecified part of right bronchus or lung: Secondary | ICD-10-CM | POA: Insufficient documentation

## 2018-04-02 MED ORDER — ALBUTEROL SULFATE (2.5 MG/3ML) 0.083% IN NEBU: 2.5000 mg | INHALATION_SOLUTION | Freq: Four times a day (QID) | RESPIRATORY_TRACT | 1 refills | Status: AC | PRN

## 2018-04-02 MED ORDER — ALBUTEROL SULFATE HFA 108 (90 BASE) MCG/ACT IN AERS: 2.0000 | INHALATION_SPRAY | Freq: Four times a day (QID) | RESPIRATORY_TRACT | 2 refills | Status: AC | PRN

## 2018-04-02 MED ORDER — IPRATROPIUM BROMIDE 0.02 % IN SOLN: 0.5000 mg | Freq: Four times a day (QID) | RESPIRATORY_TRACT | 12 refills | Status: AC

## 2018-04-02 NOTE — Progress Notes (Signed)
Sonya [name], is a 62 y.o. female  INO:[id number]  SJG:[id number]  DOB - 1956/04/24  CC:  Chief Complaint  Patient presents with  . Establish Care  . Hospitalization Follow-up    ED-Hosp 10/29-11/9: lung mass       HPI: Sonya [name] is a 62 y.o. female is here today to establish care.   Sonya [name] has Chronic back pain; Tobacco abuse; Pulmonary mass; Chronic anemia; Hypercalcemia; Hypertensive urgency; HTN (hypertension); Lung mass; Occlusion of pulmonary artery (HCC); COPD with acute exacerbation ([place]); Tachycardia; Atrial fibrillation with RVR ([place]); New onset atrial fibrillation ([place]); Acute deep vein thrombosis (DVT) of upper extremity ([place]); Acute deep vein thrombosis (DVT) of axillary vein of left upper extremity ([place]); Acute deep vein thrombosis (DVT) of axillary vein of right upper extremity ([place]); Hypercoagulable state, secondary ([place]); and Superior vena cava syndrome on their problem list.    Today's visit:  Sonya [name] is here today for a hospital follow-up.  Patient presented to [facility] on 03/17/2018 with a complaint of shortness of breath.  Previously December 04, 2017 a CT of the chest revealed a large right suprahilar mass with involvement of the right upper lobe bronchus and SVC. Patient was advised to follow-up outpatient with the primary doctor to be and offered an admission which she declined.  She reports that she did not follow-up she did not think it was that serious.  She also is a chronic smoker and has continued to smoke over the course of the last several months.  She also suffers from COPD.  Her work-up was negative for PE. She was treated for COPD exacerbation with steroids, antibiotics, and inhalers.  The subsequent day patient developed new onset atrial fibrillation.  She converted back to normal sinus rhythm on 03/20/2018 and was placed on IV Cardizem then switched to oral Cardizem. She currently prescribed Eliquis for  anticoagulation.  She suffered elevated blood pressure while hospitalized and was placed on a low-dose of hydrochlorothiazide as well as her oral Cardizem although notes during her time since being home she has increasingly experienced dizziness any form of ambulation weakness.  On arrival today blood pressure is noted to be soft. Remains compliant with medications.  The cancer center has been attempting to reach patient and her family members as the CT results of her biopsy have resulted.  She phoned and obtain those results while here in the office today.  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 She is scheduled to meet with radiology tomorrow at the cancer center.  Family is requesting the following items for mobility and  home use:  -A wheelchair lifts -Hospital bed -Nebulizer machine for breathing treatments -Bedside commode  She has not had any recent falls, although reports close to falling since being home. [facility] was to begin coming to the home , but apparently an incorrect point of contact was given. However niece reports, [facility] is scheduled to come to home today for physical therapy.  She is unable to ambulate upstairs to the restroom without a two-person assist given her current state of weakness and dizziness.  She is eating small portions of food and drinking 2-3 Ensures daily for nutritional supplementation. Current Body mass index is 17.16 kg/m.  Current medications: Current Outpatient Medications:  .  apixaban (ELIQUIS) 5 MG TABS tablet, Take 2 tablets (10 mg total) by mouth 2 (two) times daily for 6 days., Disp: 24 tablet, Rfl: 0 .  [START  ON 04/03/2018] apixaban (ELIQUIS) 5 MG TABS tablet, Take 1 tablet (5 mg total) by mouth 2 (two) times daily., Disp: 30 tablet, Rfl: 0 .  diltiazem (CARDIZEM CD) 120 MG 24 hr capsule, Take 1 capsule (120 mg total) by mouth daily., Disp: 30 capsule, Rfl: 0 .  feeding supplement, ENSURE  ENLIVE, (ENSURE ENLIVE) LIQD, Take 237 mLs by mouth 2 (two) times daily between meals., Disp: 237 mL, Rfl: 12 .  hydrALAZINE (APRESOLINE) 20 MG/ML injection, Inject 0.5 mLs (10 mg total) into the vein every 6 (six) hours as needed (SBP > 180 OR DBP > 110)., Disp: 1 mL, Rfl:  .  hydrochlorothiazide (MICROZIDE) 12.5 MG capsule, Take 1 capsule (12.5 mg total) by mouth daily., Disp: 30 capsule, Rfl: 0 .  oxyCODONE-acetaminophen (PERCOCET/ROXICET) 5-325 MG tablet, Take 1 tablet by mouth every 8 (eight) hours as needed for severe pain. (Patient not taking: Reported on 03/17/2018), Disp: 15 tablet, Rfl: 0   Pertinent family medical history: family history includes Heart disease in her brother.   No Known Allergies  Social History   Socioeconomic History  . Marital status: Divorced    Spouse name: Not on file  . Number of children: Not on file  . Years of education: Not on file  . Highest education level: Not on file  Occupational History  . Not on file  Social Needs  . Financial resource strain: Not on file  . Food insecurity:    Worry: Not on file    Inability: Not on file  . Transportation needs:    Medical: Not on file    Non-medical: Not on file  Tobacco Use  . Smoking status: Current Every Day Smoker    Packs/day: 0.25  . Smokeless tobacco: Never Used  Substance and Sexual Activity  . Alcohol use: Not Currently  . Drug use: No  . Sexual activity: Not on file  Lifestyle  . Physical activity:    Days per week: Not on file    Minutes per session: Not on file  . Stress: Not on file  Relationships  . Social connections:    Talks on phone: Not on file    Gets together: Not on file    Attends religious service: Not on file    Active member of club or organization: Not on file    Attends meetings of clubs or organizations: Not on file    Relationship status: Not on file  . Intimate partner violence:    Fear of current or ex partner: Not on file    Emotionally abused: Not on  file    Physically abused: Not on file    Forced sexual activity: Not on file  Other Topics Concern  . Not on file  Social History Narrative  . Not on file    Review of Systems: Constitutional: Negative for fever, chills, diaphoresis, activity change, appetite change and fatigue. HENT: Negative for ear pain, nosebleeds, congestion, facial swelling, rhinorrhea, neck pain, neck stiffness and ear discharge.  Eyes: Negative for pain, discharge, redness, itching and visual disturbance. Respiratory: Negative for cough, choking, chest tightness, shortness of breath, wheezing and stridor.  Cardiovascular: Negative for chest pain, palpitations and leg swelling. Gastrointestinal: Negative for abdominal distention. Genitourinary: Negative for dysuria, urgency, frequency, hematuria, flank pain, decreased urine volume, difficulty urinating. Musculoskeletal: Negative for back pain, joint swelling, arthralgia and gait problem. Neurological: Negative for dizziness, tremors, seizures, syncope, facial asymmetry, speech difficulty, weakness, light-headedness, numbness and headaches.  Hematological: Negative for adenopathy.  Does not bruise/bleed easily. Psychiatric/Behavioral: Negative for hallucinations, behavioral problems, confusion, dysphoric mood, decreased concentration and agitation.    Objective:   Vitals:   04/02/18 1508  BP: 109/70  Pulse: (!) 108  Resp: 18  SpO2: 92%    BP Readings from Last 3 Encounters:  03/28/18 118/63  12/04/17 (!) 190/85  06/20/15 (!) 196/87    There were no vitals filed for this visit.    Physical Exam: Constitutional: Patient appears well-developed and well-nourished. No distress. HENT: Normocephalic, atraumatic, External right and left ear normal. Oropharynx is clear and moist.  Eyes: Conjunctivae and EOM are normal. PERRLA, no scleral icterus. Neck: Normal ROM. Neck supple. No JVD. No tracheal deviation. No thyromegaly. CVS: RRR, S1/S2 +, no murmurs, no  gallops, no carotid bruit.  Pulmonary: Effort and breath sounds normal, no stridor, rhonchi, wheezes, rales.  Abdominal: Soft. BS +, no distension, tenderness, rebound or guarding.  Musculoskeletal: Normal range of motion. No edema and no tenderness.  Neuro: Alert. Normal muscle tone coordination. Normal gait. BUE and BLE strength 5/5. Bilateral hand grips symmetrical. No cranial nerve deficit. Skin: Skin is warm and dry. No rash noted. Not diaphoretic. No erythema. No pallor. Psychiatric: Normal mood and affect. Behavior, judgment, thought content normal.  Lab Results (prior encounters)  Lab Results  Component Value Date   WBC 11.5 (H) 03/28/2018   HGB 8.5 (L) 03/28/2018   HCT 26.1 (L) 03/28/2018   MCV 94.2 03/28/2018   PLT 403 (H) 03/28/2018   Lab Results  Component Value Date   CREATININE 0.72 03/28/2018   BUN 18 03/28/2018   NA 132 (L) 03/28/2018   K 3.7 03/28/2018   CL 95 (L) 03/28/2018   CO2 28 03/28/2018      Assessment and plan:  1. Encounter to establish care 2. Hospital discharge follow-up 3. New onset atrial fibrillation (HCC) Continue Eliquis.  Patient shows no signs or symptoms of bleeding. Continue Cardizem.  Blood pressure is rather soft today she was placed on hydrochlorothiazide along with a Cardizem while in the hospital. Holding the HCTZ.  4. COPD with acute exacerbation ([place]), stable. -For now will start nebulizer treatments with ipratropium and albuterol every 6 hours as needed. -Encourage smoking cessation -Albuterol inhaler 2 puffs every 4-6 hours as needed -No improvement will consider will consider another round of prednisone.  5. Lung mass -Currently connected and establish with [facility].  She has radiation follow-up scheduled for tomorrow and she is scheduled with Dr. Earlie Server on 04/14/18  6. Hypotension, unspecified hypotension type -Hold HCTZ.  Patient is approximately 100 pounds and currently prescribed Cardizem for atrial  fib.  Blood pressure is soft today and likely has been soft at home as she asked to experience worsening dizziness since leaving the hospital.  7. Weight loss of more than 10% body weight -Encourage small frequent meals.  Patient tires with eating.  Recommended to continue Ensure at least in place of any missed meals.  8. Shortness of breath -Start nebulizer treatments with albuterol and ipratropium every 6 hours as needed for shortness of breath. Albuterol rescue inhaler provided 2 puffs every 4-6 hours as needed.  9. Dizziness  -Likely secondary to soft blood pressures.  Holding HCTZ.   [facility] equipment.   Return in about 3 months (around 07/03/2018), or Hypertension .   A total of 45  minutes spent, greater than 50 % of this time was spent counseling and coordination of  care.  The patient was given clear instructions to go to ER or return to medical center if symptoms don't improve, worsen or new problems develop. The patient verbalized understanding. The patient was advised  to call and obtain lab results if they haven't heard anything from out office within 7-10 business days.  Molli Barrows, FNP Primary Care at [facility] [address] [place] [address] [phone]: [phone]    This note has been created with Dragon speech recognition software and Engineer, materials. Any transcriptional errors are unintentional.

## 2018-04-02 NOTE — Progress Notes (Signed)
Rad Onc updated me on appt for patient. I called and updated on appt for tomorrow arrive at 1:15.

## 2018-04-02 NOTE — Patient Instructions (Addendum)
Stop Hydrochlorothiazide as I think this is causing dizziness. I will order items requested and [facility] will notify you once items are ready.   Thank you for choosing Primary Care at [facility] to be your medical home!    Makinzey Banes was seen by Molli Barrows, FNP today.   Barnetta Chapel Fogleman's primary care provider is Scot Jun, FNP.   For the best care possible, you should try to see Molli Barrows, FNP-C whenever you come to the clinic.   We look forward to seeing you again soon!  If you have any questions about your visit today, please call us at [phone] or feel free to reach your primary care provider via [place].

## 2018-04-02 NOTE — Telephone Encounter (Signed)
Thank you for your help today. Patient will be seen here tomorrow and Elmyra Ricks and pateint aare aware of appt

## 2018-04-02 NOTE — Telephone Encounter (Signed)
Patient showed up to office with niece Elmyra Ricks Davis([phone]) & nephew Marjory Lies Laws(ph.[phone]). I gave them your direct number but I wanted to reach out to you with alternate numbers.

## 2018-04-02 NOTE — Progress Notes (Signed)
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 She did not follow-up with her primary care doctor when discharge in July she continued to smoke  Biopsies of RLL lung (if applicable) revealed:    Tobacco/Marijuana/Snuff/ETOH use: Former smoker. Reports quitting one month ago.  Past/Anticipated interventions by cardiothoracic surgery, if any: no  Past/Anticipated interventions by medical oncology, if any: no  Signs/Symptoms  Weight changes, if any: yes, down 30 lb in six months  Respiratory complaints, if any: yes, productive cough with thick white sticky sputum, SOB with mild exertion.  Hemoptysis, if any: no  Pain issues, if any:  yes, chest pain with cough  SAFETY ISSUES:  Prior radiation? no  Pacemaker/ICD? no   Possible current pregnancy?no  Is the patient on methotrexate? no  Current Complaints / other details:  62 year old female. Divorced.

## 2018-04-02 NOTE — Telephone Encounter (Signed)
Oncology Nurse Navigator Documentation  Oncology Nurse Navigator Flowsheets 04/02/2018  Navigator Location [facility]  Navigator Encounter Type Telephone  Telephone Outgoing Call/I called patient's home number and someone picked up and asked that I call another number [phone].  I called but no answer.  Patient has an urgent need to be seen by Rad Onc and I am unable to reach her.    Treatment Phase Pre-Tx/Tx Discussion  Barriers/Navigation Needs Coordination of Care  Interventions Coordination of Care  Coordination of Care Other  Acuity Level 2  Time Spent with Patient 30

## 2018-04-02 NOTE — Telephone Encounter (Signed)
I called NP Molli Barrows office due to patient coming to see her today.  They updated me on another phone number. I called [phone] but was unable to reach or leave vm message. I did speak with Ms. Harris assistant and gave her my phone number and to update patient to call me.

## 2018-04-02 NOTE — Progress Notes (Signed)
Oncology Nurse Navigator Documentation  Oncology Nurse Navigator Flowsheets 04/02/2018  Navigator Location [facility]  Navigator Encounter Type Telephone/I received a call from patient's niece at patient's doctors appt.  I updated her patient needs to be seen. She gave me her phone to call and I updated EMR.  I then updated Dr. Tammi Klippel and he asked for urgent referral.  This has been completed.   Telephone Incoming Call  Treatment Phase Pre-Tx/Tx Discussion  Barriers/Navigation Needs Education;Coordination of Care  Education Other  Interventions Coordination of Care;Education  Coordination of Care Other  Education Method Verbal  Acuity Level 2  Time Spent with Patient 26

## 2018-04-03 ENCOUNTER — Telehealth: Payer: Self-pay | Admitting: Family Medicine

## 2018-04-03 ENCOUNTER — Encounter: Payer: Self-pay | Admitting: Radiation Oncology

## 2018-04-03 ENCOUNTER — Telehealth: Payer: Self-pay

## 2018-04-03 ENCOUNTER — Other Ambulatory Visit: Payer: Self-pay

## 2018-04-03 ENCOUNTER — Ambulatory Visit
Admission: RE | Admit: 2018-04-03 | Discharge: 2018-04-03 | Disposition: A | Discharge status: Home / Self Care | Payer: Medicaid Other | Source: Ambulatory Visit | Attending: Radiation Oncology | Admitting: Radiation Oncology

## 2018-04-03 ENCOUNTER — Telehealth: Payer: Self-pay | Admitting: Radiation Oncology

## 2018-04-03 ENCOUNTER — Encounter: Payer: Self-pay | Admitting: Family Medicine

## 2018-04-03 VITALS — BP 124/65 | HR 96 | Temp 98.9°F | Resp 24 | Ht 69.0 in | Wt 101.0 lb

## 2018-04-03 DIAGNOSIS — C3491 Malignant neoplasm of unspecified part of right bronchus or lung: Secondary | ICD-10-CM

## 2018-04-03 DIAGNOSIS — C3411 Malignant neoplasm of upper lobe, right bronchus or lung: Secondary | ICD-10-CM

## 2018-04-03 DIAGNOSIS — I871 Compression of vein: Secondary | ICD-10-CM

## 2018-04-03 DIAGNOSIS — Z510 Encounter for antineoplastic radiation therapy: Secondary | ICD-10-CM | POA: Insufficient documentation

## 2018-04-03 DIAGNOSIS — C3431 Malignant neoplasm of lower lobe, right bronchus or lung: Secondary | ICD-10-CM | POA: Insufficient documentation

## 2018-04-03 HISTORY — DX: Malignant neoplasm of unspecified part of unspecified bronchus or lung: C34.90

## 2018-04-03 NOTE — Progress Notes (Signed)
  Radiation Oncology         [phone] ________________________________  Name: Jakyria Bleau MRN: [id number]  Date: 04/03/2018  DOB: 1955-06-23  SIMULATION AND TREATMENT PLANNING NOTE    ICD-10-CM   1. Non-small cell carcinoma of right lung, stage 3 (HCC) C34.91   2. Superior vena cava syndrome I87.1     DIAGNOSIS:  62 y/o female with newly diagnosed T4N0M0, Stage IIIA, NSCLC, squamous cell carcinoma of the right lower lobe pending PET staging  NARRATIVE:  The patient was brought to the [facility].  Identity was confirmed.  All relevant records and images related to the planned course of therapy were reviewed.  The patient freely provided informed written consent to proceed with treatment after reviewing the details related to the planned course of therapy. The consent form was witnessed and verified by the simulation staff.  Then, the patient was set-up in a stable reproducible  supine position for radiation therapy.  CT images were obtained.  Surface markings were placed.  The CT images were loaded into the planning software.  Then the target and avoidance structures were contoured.  Treatment planning then occurred.  The radiation prescription was entered and confirmed.  Then, I designed and supervised the construction of a total of 6 medically necessary complex treatment devices, including a BodyFix immobilization mold custom fitted to the patient along with 5 multileaf collimators conformally shaped radiation around the treatment target while shielding critical structures such as the heart and spinal cord maximally.  I have requested : 3D Simulation  I have requested a DVH of the following structures: Left lung, right lung, spinal cord, heart, esophagus, and target.  I have ordered:Nutrition Consult  SPECIAL TREATMENT PROCEDURE:  The planned course of therapy using radiation constitutes a special treatment procedure. Special care is required in the management of this  patient for the following reasons.  The patient will be receiving concurrent chemotherapy requiring careful monitoring for increased toxicities of treatment including periodic laboratory values.  The special nature of the planned course of radiotherapy will require increased physician supervision and oversight to ensure patient's safety with optimal treatment outcomes.  PLAN:  The patient will receive 66 Gy in 33 fractions.  ________________________________  Sheral Apley Tammi Klippel, M.D.  This document serves as a record of services personally performed by Tyler Pita, MD. It was created on his behalf by Wilburn Mylar, a trained medical scribe. The creation of this record is based on the scribe's personal observations and the provider's statements to them. This document has been checked and approved by the attending provider.

## 2018-04-03 NOTE — Progress Notes (Signed)
See progress note under physician encounter. 

## 2018-04-03 NOTE — Telephone Encounter (Signed)
Printed Rxs, office note, demographics & insurance card faxed to [place] [phone])

## 2018-04-03 NOTE — Telephone Encounter (Signed)
Fax medical supply order to [facility].  Patient wishes to have items to delivered. If nebulizer can be picked-up sooner than other items let patient's niece aware of this.   Molli Barrows, FNP Primary Care at [facility] [address] [place] [phone]: [phone]

## 2018-04-03 NOTE — Progress Notes (Signed)
Received phone message from patient's niece, Micole. Phoned back. She requested number for nutritionist. Information requested given.

## 2018-04-03 NOTE — Progress Notes (Signed)
Radiation Oncology         ([phone]) [phone] ________________________________  Initial outpatient Consultation  Name: Sonya [name] MRN: [id number]  Date of Service: 04/03/2018 DOB: [birth year]  XH:[id number], Carroll Sage, FNP  Scot Jun, FNP   REFERRING PHYSICIAN: Scot Jun, FNP  DIAGNOSIS: 62 y/o female with newly diagnosed T4N0M0, Stage IIIA, NSCLC, squamous cell carcinoma of the right lower lobe    ICD-10-CM   1. 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 Non-small cell carcinoma of right lung, stage 3 (HCC) C34.91 NM PET Image Initial (PI) Skull Base To Thigh    HISTORY OF PRESENT ILLNESS: Sonya [name] is a 62 y.o. female seen at the request of Dr. Loanne Drilling.  She initially presented to the emergency department at [facility] on 12/04/2017 with complaints of back pain.  As part of her work-up, she had a CT chest which showed a large right upper lobe/suprahilar mass involving the right upper lobe bronchus, right upper lobe pulmonary artery, and SVC. There was atelectasis/consolidation throughout the remainder of the right upper lobe and nonspecific left upper lobe subcentimeter pulmonary nodules. No other suggestion of adenopathy or distal metastatic disease.  She was discharged home with pain medications at that time and instructed to follow-up with her primary care physician.  Attempts were made to get her in at the [facility] but the patient never followed up.  More recently, she presented back to the emergency department on 03/17/2018 with a 2-week history of nonproductive cough, increased shortness of breath and weight loss.  CTA was performed and demonstrated increased vascular invasion and/or mass effect by the right suprahilar mass, resulting in new occlusion of the right middle lobe pulmonary artery and persistent right upper lobe pulmonary artery occlusion as well as occlusion/invasion of the superior vena cava.  There was debris within  the right mainstem bronchus and right upper lobe atelectasis.  CT A/P for disease staging failed to show any acute abnormality within the abdomen or pelvis.      She had a CT-guided biopsy of the right lower lobe mass by Dr. Earleen Newport on 03/18/18.  The low density aspect of the mass was targeted as the safest target but this was found to be large cystic area, with frank serous fluid returned.  The procedure was aborted without tissue sample.  Pulmonary was consulted for diagnostic bronchoscopy with EBUS for tissue confirmation and this was performed by Dr. Loanne Drilling on 03/26/18 with final pathology confirming squamous cell carcinoma in the RLL lesion without nodal involvement consistent with cT4N0M0, Stage IIIA disease.   She had a noncontrast MRI brain on 03/18/18 for disease staging and there were no intracranial metastasis noted although exam was limited due to no post-contrast sequences.  Her case was presented and discussed in [place] on 04/02/18 and the recommendation was to proceed with concurrent chemoradiation but urgent start of XRT was felt pertinent due to radiographic evidence of SVC compression.  She will also need a PET scan to complete her disease staging and these findings could potentially change our final treatment recommendation.  She presents today to discuss the role of radiotherapy in the management of her disease.  She is accompanied today by her sister, niece and nephew.  PREVIOUS RADIATION THERAPY: No  PAST MEDICAL HISTORY:  Past Medical History:  Diagnosis Date  . Atrial fibrillation ([place])   . Bronchitis   . Chronic back pain   . COPD (chronic obstructive pulmonary disease) ([place])   .  HTN (hypertension)   . Lung cancer ([place])   . Mass of upper lobe of right lung   . Scoliosis       PAST SURGICAL HISTORY: Past Surgical History:  Procedure Laterality Date  . ENDOBRONCHIAL ULTRASOUND Bilateral 03/26/2018   Procedure: ENDOBRONCHIAL ULTRASOUND;  Surgeon: Margaretha Seeds,  MD;  Location: [facility] ENDOSCOPY;  Service: Pulmonary;  Laterality: Bilateral;    FAMILY HISTORY:  Family History  Problem Relation Age of Onset  . Heart disease Brother   . Cancer Brother   . Cancer Sister   . Cancer Brother     SOCIAL HISTORY:  Social History   Socioeconomic History  . Marital status: Divorced    Spouse name: Not on file  . Number of children: Not on file  . Years of education: Not on file  . Highest education level: Not on file  Occupational History  . Not on file  Social Needs  . Financial resource strain: Not on file  . Food insecurity:    Worry: Not on file    Inability: Not on file  . Transportation needs:    Medical: Not on file    Non-medical: Not on file  Tobacco Use  . Smoking status: Former Smoker    Packs/day: 0.25    Years: 40.00    Pack years: 10.00    Types: Cigarettes    Last attempt to quit: 03/03/2018    Years since quitting: 0.0  . Smokeless tobacco: Never Used  Substance and Sexual Activity  . Alcohol use: Not Currently  . Drug use: No  . Sexual activity: Not Currently  Lifestyle  . Physical activity:    Days per week: Not on file    Minutes per session: Not on file  . Stress: Not on file  Relationships  . Social connections:    Talks on phone: Not on file    Gets together: Not on file    Attends religious service: Not on file    Active member of club or organization: Not on file    Attends meetings of clubs or organizations: Not on file    Relationship status: Not on file  . Intimate partner violence:    Fear of current or ex partner: Not on file    Emotionally abused: Not on file    Physically abused: Not on file    Forced sexual activity: Not on file  Other Topics Concern  . Not on file  Social History Narrative  . Not on file    ALLERGIES: Patient has no known allergies.  MEDICATIONS:  Current Outpatient Medications  Medication Sig Dispense Refill  . albuterol (PROVENTIL HFA;VENTOLIN HFA) 108 (90 Base)  MCG/ACT inhaler Inhale 2 puffs into the lungs every 6 (six) hours as needed for wheezing or shortness of breath. 1 Inhaler 2  . albuterol (PROVENTIL) (2.5 MG/3ML) 0.083% nebulizer solution Take 3 mLs (2.5 mg total) by nebulization every 6 (six) hours as needed for wheezing or shortness of breath. 150 mL 1  . apixaban (ELIQUIS) 5 MG TABS tablet Take 2 tablets (10 mg total) by mouth 2 (two) times daily for 6 days. 24 tablet 0  . apixaban (ELIQUIS) 5 MG TABS tablet Take 1 tablet (5 mg total) by mouth 2 (two) times daily. 30 tablet 0  . diltiazem (CARDIZEM CD) 120 MG 24 hr capsule Take 1 capsule (120 mg total) by mouth daily. 30 capsule 0  . feeding supplement, ENSURE ENLIVE, (ENSURE ENLIVE) LIQD Take 237 mLs  by mouth 2 (two) times daily between meals. 237 mL 12  . ipratropium (ATROVENT) 0.02 % nebulizer solution Take 2.5 mLs (0.5 mg total) by nebulization 4 (four) times daily. 75 mL 12  . oxyCODONE-acetaminophen (PERCOCET/ROXICET) 5-325 MG tablet Take 1 tablet by mouth every 8 (eight) hours as needed for severe pain. 15 tablet 0  . hydrALAZINE (APRESOLINE) 20 MG/ML injection Inject 0.5 mLs (10 mg total) into the vein every 6 (six) hours as needed (SBP > 180 OR DBP > 110). (Patient not taking: Reported on 04/03/2018) 1 mL    No current facility-administered medications for this encounter.     REVIEW OF SYSTEMS:  On review of systems, the patient reports that she is doing relatively well overall. She has fatigue/generalized malaise, cough and shortness of breath but feels that her breathing is slightly improved over the last week.  She denies any chest pain, increased shortness of breath, fevers, chills, night sweats, or unintended weight changes. She has noticed swelling in her face and neck area as well as prominent vessels on her chest.  She had some pain in the right shoulder several weeks ago but this has resolved over the past 7-10 days.  She has a chronic, productive cough with thick, whitish sputum  but denies hemoptysis or yellow/green coloration to sputum.  She denies any bowel or bladder disturbances, and denies abdominal pain, nausea or vomiting. She denies any new musculoskeletal or joint aches or pains. A complete review of systems is obtained and is otherwise negative.    PHYSICAL EXAM:  Wt Readings from Last 3 Encounters:  04/03/18 101 lb (45.8 kg)  04/02/18 100 lb (45.4 kg)  03/17/18 104 lb (47.2 kg)   Temp Readings from Last 3 Encounters:  04/03/18 98.9 F (37.2 C) (Oral)  03/28/18 99.2 F (37.3 C) (Oral)  12/04/17 99.2 F (37.3 C) (Oral)   BP Readings from Last 3 Encounters:  04/03/18 124/65  04/02/18 109/70  03/28/18 118/63   Pulse Readings from Last 3 Encounters:  04/03/18 96  04/03/18 98  03/28/18 85   Pain Assessment Pain Score: 8  Pain Loc: Chest/10  In general this is a frail appearing African American woman in no acute distress. She is alert and oriented x4 and appropriate throughout the examination. HEENT reveals that the patient is normocephalic, atraumatic. EOMs are intact. PERRLA. Skin is intact without any evidence of gross lesions. Cardiovascular exam reveals a regular rate and rhythm, no clicks rubs or murmurs are auscultated. Lung exam is with wheezing bilaterally and decreased lung sounds on the right. Lymphatic assessment is performed and does not reveal any adenopathy in the cervical, supraclavicular, axillary, or inguinal chains. There is evidence of SVCS with venous distention in the vessels of the neck and chest wall with multiple venous collaterals.  There is mild facial swelling but no swelling in the upper extremities.  Abdomen has active bowel sounds in all quadrants and is intact. The abdomen is soft, non tender, non distended. Lower extremities are negative for pretibial pitting edema, deep calf tenderness, cyanosis or clubbing.  KPS = 50  100 - Normal; no complaints; no evidence of disease. 90   - Able to carry on normal activity; minor  signs or symptoms of disease. 80   - Normal activity with effort; some signs or symptoms of disease. 41   - Cares for self; unable to carry on normal activity or to do active work. 60   - Requires occasional assistance, but is able to  care for most of his personal needs. 50   - Requires considerable assistance and frequent medical care. 35   - Disabled; requires special care and assistance. 28   - Severely disabled; hospital admission is indicated although death not imminent. 77   - Very sick; hospital admission necessary; active supportive treatment necessary. 10   - Moribund; fatal processes progressing rapidly. 0     - Dead  Karnofsky DA, Abelmann [place], Craver LS and Burchenal [facility] [phone]) The use of the nitrogen mustards in the palliative treatment of carcinoma: with particular reference to bronchogenic carcinoma Cancer 1 [id number]  LABORATORY DATA:  Lab Results  Component Value Date   WBC 11.5 (H) 03/28/2018   HGB 8.5 (L) 03/28/2018   HCT 26.1 (L) 03/28/2018   MCV 94.2 03/28/2018   PLT 403 (H) 03/28/2018   Lab Results  Component Value Date   NA 132 (L) 03/28/2018   K 3.7 03/28/2018   CL 95 (L) 03/28/2018   CO2 28 03/28/2018   Lab Results  Component Value Date   ALT 15 03/25/2018   AST 17 03/25/2018   ALKPHOS 50 03/25/2018   BILITOT 0.5 03/25/2018     RADIOGRAPHY: Dg Chest 2 View  Result Date: 03/17/2018 CLINICAL DATA:  Shortness of breath EXAM: CHEST - 2 VIEW COMPARISON:  Chest CT 12/04/2017 FINDINGS: Consolidation of the right upper lobe consistent with known upper lobe mass and likely postobstructive atelectasis. No pleural effusion. Heart size normal. The left lung is clear. IMPRESSION: Known right suprahilar mass with associated upper lobe atelectasis. Lungs are otherwise clear. Electronically Signed   By: Ulyses Jarred M.D.   On: 03/17/2018 18:01   Ct Angio Chest Pe W And/or Wo Contrast  Result Date: 03/17/2018 CLINICAL DATA:  Shortness of breath and back pain. Known  right lung mass. EXAM: CT ANGIOGRAPHY CHEST CT ABDOMEN AND PELVIS WITH CONTRAST TECHNIQUE: Multidetector CT imaging of the chest was performed using the standard protocol during bolus administration of intravenous contrast. Multiplanar CT image reconstructions and MIPs were obtained to evaluate the vascular anatomy. Multidetector CT imaging of the abdomen and pelvis was performed using the standard protocol during bolus administration of intravenous contrast. CONTRAST:  137mL ISOVUE-370 IOPAMIDOL (ISOVUE-370) INJECTION 76% COMPARISON:  Chest CT 12/04/2017 FINDINGS: CTA CHEST FINDINGS Cardiovascular: Contrast injection is sufficient to demonstrate satisfactory opacification of the pulmonary arteries to the segmental level. There is no pulmonary embolus. The main pulmonary artery is within normal limits for size. 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 The inter lobar artery is now also occluded. There is calcific aortic atherosclerosis. No dissection. There is a normal variant aortic arch branching pattern with the brachiocephalic and left common carotid arteries sharing a common origin. Heart size is normal, without pericardial effusion. There are large paraspinal venous collaterals. Mediastinum/Nodes: Right-sided mediastinal fat is effaced by the low right suprahilar mass. Multiple subcentimeter bilateral axillary lymph nodes. Lungs/Pleura: There is debris within the right mainstem bronchus and bronchus intermedius. Right upper lobe bronchus is occluded. There is a large suprahilar right lung mass that appears unchanged in size. The mass is difficult to separate from the surrounding lung. 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 Increased confluent nodularity in the left lower lobe. Clustered nodules in the left upper lobe have increased in size, measuring up to 12 mm. Musculoskeletal: No chest wall  abnormality. No acute or significant osseous findings. Review of  the MIP images confirms the above findings. CT ABDOMEN and PELVIS FINDINGS Hepatobiliary: Normal hepatic contours and density. No visible biliary dilatation. Normal gallbladder. Pancreas: Normal contours without ductal dilatation. No peripancreatic fluid collection. Spleen: Normal. Adrenals/Urinary Tract: Normal adrenal glands. 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 Stomach/Bowel: Matted bowel in the lower abdomen and pelvis. No dilatation or inflammation visualized. Vascular/Lymphatic: Atherosclerotic calcification is present within the non-aneurysmal abdominal aorta, without hemodynamically significant stenosis. Major abdominal veins are patent. No abdominal or pelvic lymphadenopathy. Reproductive: There are multiple enhancing nodules within the posterior left pelvis. Difficult to accurately localize these lesions due to crowding pelvic structures, but these may be fibroids within the uterine wall. Musculoskeletal. No bony spinal canal stenosis or focal osseous abnormality. Other: None. IMPRESSION: 1. 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 2. Debris within the right mainstem bronchus. Right upper lobe atelectasis. 3. No acute abnormality of the abdomen or pelvis. Matted appearance of the small bowel and colon without dilatation or inflammation. 4. Enhancing nodular foci along the posterior left pelvis. Exact location is not entirely clear, but these are suspected to be subserosal fibroids of the left uterine fundus. Aortic Atherosclerosis (ICD10-I70.0). Electronically Signed   By: Ulyses Jarred M.D.   On: 03/17/2018 19:14   Mr Brain Wo Contrast  Result Date: 03/18/2018 CLINICAL DATA:   Status post lung mass biopsy, lung cancer. EXAM: MRI HEAD WITHOUT CONTRAST TECHNIQUE: Multiplanar, multiecho pulse sequences of the brain and surrounding structures were obtained without intravenous contrast. Due to cough and motion, patient was unable to tolerate further imaging and postcontrast images not obtained. COMPARISON:  CT HEAD June 13, 2015 FINDINGS: Variable motion degraded examination, severely motion degraded axial MPGR and axial T1. INTRACRANIAL CONTENTS: No reduced diffusion to suggest acute ischemia or hypercellular tumor. No susceptibility artifact to suggest hemorrhage though sequence is severely motion degraded. The ventricles and sulci are normal for patient's age. Patchy supratentorial and pontine white matter FLAIR T2 hyperintensities. No suspicious parenchymal signal, masses, mass effect. No abnormal extra-axial fluid collections. No extra-axial masses. VASCULAR: Normal major intracranial vascular flow voids present at skull base. SKULL AND UPPER CERVICAL SPINE: No abnormal sellar expansion. No suspicious calvarial bone marrow signal. Craniocervical junction maintained. SINUSES/ORBITS: Mild sphenoid sinus mucosal thickening. Mastoid air cells are well aerated. Included ocular globes and orbital contents are non-suspicious. OTHER: None. IMPRESSION: 1. Motion degraded examination without acute intracranial process. No identified intracranial metastasis though postcontrast sequences would be more sensitive. 2. Mild-to-moderate chronic small vessel ischemic changes. Electronically Signed   By: Elon Alas M.D.   On: 03/18/2018 20:55   Ct Abdomen Pelvis W Contrast  Result Date: 03/17/2018 CLINICAL DATA:  Shortness of breath and back pain. Known right lung mass. EXAM: CT ANGIOGRAPHY CHEST CT ABDOMEN AND PELVIS WITH CONTRAST TECHNIQUE: Multidetector CT imaging of the chest was performed using the standard protocol during bolus administration of intravenous contrast. Multiplanar CT  image reconstructions and MIPs were obtained to evaluate the vascular anatomy. Multidetector CT imaging of the abdomen and pelvis was performed using the standard protocol during bolus administration of intravenous contrast. CONTRAST:  146mL ISOVUE-370 IOPAMIDOL (ISOVUE-370) INJECTION 76% COMPARISON:  Chest CT 12/04/2017 FINDINGS: CTA CHEST FINDINGS Cardiovascular: Contrast injection is sufficient to demonstrate satisfactory opacification of the pulmonary arteries to the segmental level. There is  no pulmonary embolus. The main pulmonary artery is within normal limits for size. 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 The inter lobar artery is now also occluded. There is calcific aortic atherosclerosis. No dissection. There is a normal variant aortic arch branching pattern with the brachiocephalic and left common carotid arteries sharing a common origin. Heart size is normal, without pericardial effusion. There are large paraspinal venous collaterals. Mediastinum/Nodes: Right-sided mediastinal fat is effaced by the low right suprahilar mass. Multiple subcentimeter bilateral axillary lymph nodes. Lungs/Pleura: There is debris within the right mainstem bronchus and bronchus intermedius. Right upper lobe bronchus is occluded. There is a large suprahilar right lung mass that appears unchanged in size. The mass is difficult to separate from the surrounding lung. 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 Increased confluent nodularity in the left lower lobe. Clustered nodules in the left upper lobe have increased in size, measuring up to 12 mm. Musculoskeletal: No chest wall abnormality. No acute or significant osseous findings. Review of the MIP images confirms the above findings. CT ABDOMEN and PELVIS FINDINGS Hepatobiliary: Normal hepatic contours and density. No visible biliary dilatation. Normal gallbladder.  Pancreas: Normal contours without ductal dilatation. No peripancreatic fluid collection. Spleen: Normal. Adrenals/Urinary Tract: Normal adrenal glands. 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 Stomach/Bowel: Matted bowel in the lower abdomen and pelvis. No dilatation or inflammation visualized. Vascular/Lymphatic: Atherosclerotic calcification is present within the non-aneurysmal abdominal aorta, without hemodynamically significant stenosis. Major abdominal veins are patent. No abdominal or pelvic lymphadenopathy. Reproductive: There are multiple enhancing nodules within the posterior left pelvis. Difficult to accurately localize these lesions due to crowding pelvic structures, but these may be fibroids within the uterine wall. Musculoskeletal. No bony spinal canal stenosis or focal osseous abnormality. Other: None. IMPRESSION: 1. 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 2. Debris within the right mainstem bronchus. Right upper lobe atelectasis. 3. No acute abnormality of the abdomen or pelvis. Matted appearance of the small bowel and colon without dilatation or inflammation. 4. Enhancing nodular foci along the posterior left pelvis. Exact location is not entirely clear, but these are suspected to be subserosal fibroids of the left uterine fundus. Aortic Atherosclerosis (ICD10-I70.0). Electronically Signed   By: Ulyses Jarred M.D.   On: 03/17/2018 19:14   Ct Biopsy  Result Date: 03/19/2018 INDICATION: 62 year old female with a history right upper lobe tumor. She has been referred for biopsy. EXAM: CT BIOPSY MEDICATIONS: None. ANESTHESIA/SEDATION: Moderate (conscious) sedation was employed during this procedure. A total of Versed 1.0 mg and Fentanyl 50 mcg  was administered intravenously. Moderate Sedation Time: 10 minutes. The patient's level of consciousness and vital signs were monitored continuously by radiology nursing throughout the procedure under my direct supervision. FLUOROSCOPY TIME:  CT COMPLICATIONS: None PROCEDURE: The procedure, risks, benefits, and alternatives were explained to the patient and the patient's family. Specific risks that were addressed included bleeding, infection, pneumothorax, need for further procedure including chest tube placement, chance of delayed pneumothorax or hemorrhage, hemoptysis, nondiagnostic sample, cardiopulmonary collapse, death. Questions regarding the procedure were encouraged and answered. The patient understands and consents to the procedure. Patient was positioned in the supine position on the CT gantry table and a scout CT of  the chest was performed for planning purposes. Once angle of approach was determined, the skin and subcutaneous tissues this scan was prepped and draped in the usual sterile fashion, and a sterile drape was applied covering the operative field. A sterile gown and sterile gloves were used for the procedure. Local anesthesia was provided with 1% Lidocaine. The skin and subcutaneous tissues were infiltrated 1% lidocaine for local anesthesia, and a small stab incision was made with an 11 blade scalpel. Using CT guidance, a 17 gauge trocar needle was advanced into the safest aspect of the right upper lobetarget. After confirmation of the tip, the stylet was withdrawn. Immediately serous fluid returned, tidling with the breathing. A single 18 gauge core biopsy was attempted with no tissue. At this time we withdrew from the case and removed the needle. Patient tolerated the procedure well and remained hemodynamically stable throughout. No complications were encountered and no significant blood loss was encounter IMPRESSION: Status post CT-guided attempt at right upper lobe mass biopsy. The central  portion of the tumor was avoided given the high risk, and the peripheral aspect of the tumor was avoided given the high likelihood of atelectatic/postobstructive lung. In the absence of a correlative PET-CT, the needle was placed in the central/safest portion of the tumor, with discovery of frankly serous fluid/cyst. No sample was achieved. Signed, Dulcy Fanny. Dellia Nims, RPVI [facility] Electronically Signed   By: Corrie Mckusick D.O.   On: 03/19/2018 08:13   Dg Chest Port 1 View  Result Date: 03/18/2018 CLINICAL DATA:  Productive cough with clear mucus. Smoker since age 10. Lung mass. EXAM: PORTABLE CHEST 1 VIEW COMPARISON:  03/17/2018 CT and CXR FINDINGS: Unchanged pulmonary mass occupying much of the right upper thorax. Emphysematous hyperinflation of. Heart and mediastinal contours to the extent visualized appear stable. No aggressive osseous lesions. IMPRESSION: Redemonstration of right upper pulmonary mass in the setting of COPD. No significant change. Electronically Signed   By: Ashley Royalty M.D.   On: 03/18/2018 20:44   Vas [place] Upper Extremity Venous Duplex  Result Date: 03/24/2018 UPPER VENOUS STUDY  Indications: Swelling Performing Technologist: Abram Sander  Examination Guidelines: A complete evaluation includes B-mode imaging, spectral Doppler, color Doppler, and power Doppler as needed of all accessible portions of each vessel. Bilateral testing is considered an integral part of a complete examination. Limited examinations for reoccurring indications may be performed as noted.  Right Findings: +----------+------------+----------+---------+-----------+-------+ RIGHT     CompressiblePropertiesPhasicitySpontaneousSummary +----------+------------+----------+---------+-----------+-------+ IJV           None                 No        No             +----------+------------+----------+---------+-----------+-------+ Subclavian     None                 No        No             +----------+------------+----------+---------+-----------+-------+ Axillary      None                 No        No             +----------+------------+----------+---------+-----------+-------+ Brachial    Partial                                         +----------+------------+----------+---------+-----------+-------+  Radial        Full                                          +----------+------------+----------+---------+-----------+-------+ Ulnar         Full                                          +----------+------------+----------+---------+-----------+-------+ Cephalic      Full                                          +----------+------------+----------+---------+-----------+-------+ Basilic       None                                          +----------+------------+----------+---------+-----------+-------+  Left Findings: +----------+------------+----------+---------+-----------+-------+ LEFT      CompressiblePropertiesPhasicitySpontaneousSummary +----------+------------+----------+---------+-----------+-------+ IJV           None                 No        No             +----------+------------+----------+---------+-----------+-------+ Subclavian    Full                 Yes       Yes            +----------+------------+----------+---------+-----------+-------+ Axillary                           Yes       Yes            +----------+------------+----------+---------+-----------+-------+ Brachial    Partial                                         +----------+------------+----------+---------+-----------+-------+ Radial        Full                                          +----------+------------+----------+---------+-----------+-------+ Ulnar         Full                                          +----------+------------+----------+---------+-----------+-------+  Cephalic      Full                                          +----------+------------+----------+---------+-----------+-------+ Basilic       Full                                          +----------+------------+----------+---------+-----------+-------+  Summary:  Right: Findings consistent with acute deep vein thrombosis involving the right internal jugular veins, right subclavian veins, right axillary vein and right brachial veins.  Left: Findings consistent with acute deep vein thrombosis involving the left internal jugular veins and left brachial veins.  *See table(s) above for measurements and observations.  Diagnosing physician: Harold Barban MD Electronically signed by Harold Barban MD on 03/24/2018 at 6:55:33 PM.    Final       IMPRESSION/PLAN: 52. 62 y.o. 62 y/o female with newly diagnosed T4N0M0, Stage IIIA, NSCLC, squamous cell carcinoma of the right lower lobe. Today, we talked to the patient and family about the findings and workup thus far. We discussed the natural history of NSCLC, squamous cell carcinoma of the lung and general treatment, highlighting the role of radiotherapy in the management. There is radiographic evidence of progressive SVC compression on imaging, so the recommendation is to begin radiotherapy urgently. We discussed the available radiation techniques, and focused on the details of logistics and delivery. The recommendation is to proceed with a 6.5 week course of concurrent chemoradiation, with XRT to start as soon as Monday 04/06/18, since currently she appears to have only locally advanced disease.  We reviewed the anticipated acute and late sequelae associated with radiation in this setting. She will also need a PET scan to complete her disease staging and these findings could potentially change our final treatment recommendation.  The patient was encouraged to ask questions that were answered to her satisfaction.   At the conclusion of our conversation, the  patient elects to proceed with radiation up front and tentative plans to move forward with concurrent chemoradiotherapy pending there are no distant sites of metastasis seen on upcoming PET.  She appears to have a good understanding of her overall disease and our recommendations which are to proceed with treatment of curative intent unless otherwise indicated on PET imaging.  She has freely signed written consent to proceed today in the office and a copy of this document has been placed in her medical record.  She will proceed with CT simulation/treatment planning following our appointment today in anticipation of beginning radiotherapy on Monday, 04/06/2018.  She knows to call at anytime with any questions or concerns in the interim.   Nicholos Johns, PA-C    Tyler Pita, MD  [facility] Direct Dial: [phone]  Fax: [phone] .com  Skype  LinkedIn   This document serves as a record of services personally performed by Tyler Pita, MD and Freeman Caldron, PA-C. It was created on their behalf by Wilburn Mylar, a trained medical scribe. The creation of this record is based on the scribe's personal observations and the provider's statements to them. This document has been checked and approved by the attending provider.

## 2018-04-06 ENCOUNTER — Telehealth: Payer: Self-pay | Admitting: Nutrition

## 2018-04-06 ENCOUNTER — Ambulatory Visit
Admission: RE | Admit: 2018-04-06 | Discharge: 2018-04-06 | Disposition: A | Discharge status: Home / Self Care | Payer: Medicaid Other | Source: Ambulatory Visit | Attending: Radiation Oncology | Admitting: Radiation Oncology

## 2018-04-06 ENCOUNTER — Telehealth: Payer: Self-pay | Admitting: *Deleted

## 2018-04-06 DIAGNOSIS — Z510 Encounter for antineoplastic radiation therapy: Secondary | ICD-10-CM | POA: Diagnosis not present

## 2018-04-06 NOTE — Telephone Encounter (Signed)
Oncology Nurse Navigator Documentation  Oncology Nurse Navigator Flowsheets 04/06/2018  Navigator Location [facility]  Navigator Encounter Type Telephone/I received a vm from patient's niece.  I called her back and was unable to reach her. I did leave a vm message to call me with my name and phone number.   Telephone Incoming Call;Outgoing Call  Treatment Phase Pre-Tx/Tx Discussion  Barriers/Navigation Needs Education  Education Other  Interventions Education  Education Method Verbal  Acuity Level 1  Time Spent with Patient 15

## 2018-04-06 NOTE — Telephone Encounter (Signed)
Patient's family requesting assistance with Ensure Enlive.  Patient's insurance does not cover oral nutrition supplements. Patient is underweight. Provided 1 complementary case of Ensure Enlive.  Family will pick up from patient and family support center today.

## 2018-04-06 NOTE — Telephone Encounter (Signed)
Dr. Loanne Drilling was able to reach the patient last week to discuss results

## 2018-04-07 ENCOUNTER — Ambulatory Visit
Admission: RE | Admit: 2018-04-07 | Discharge: 2018-04-07 | Disposition: A | Discharge status: Home / Self Care | Payer: Medicaid Other | Source: Ambulatory Visit | Attending: Radiation Oncology | Admitting: Radiation Oncology

## 2018-04-07 ENCOUNTER — Telehealth: Payer: Self-pay | Admitting: *Deleted

## 2018-04-07 DIAGNOSIS — Z510 Encounter for antineoplastic radiation therapy: Secondary | ICD-10-CM | POA: Diagnosis not present

## 2018-04-07 NOTE — Telephone Encounter (Signed)
Called patient to inform of Pet Scan for 04-08-18 - arrival time - 7:30 am @ [facility], pt. to be NPO- 6 hrs. prior to test, spoke with relative [name] and she is aware of this test

## 2018-04-08 ENCOUNTER — Ambulatory Visit
Admission: RE | Admit: 2018-04-08 | Discharge: 2018-04-08 | Disposition: A | Discharge status: Home / Self Care | Payer: Medicaid Other | Source: Ambulatory Visit | Attending: Radiation Oncology | Admitting: Radiation Oncology

## 2018-04-08 ENCOUNTER — Ambulatory Visit (HOSPITAL_COMMUNITY)
Admission: RE | Admit: 2018-04-08 | Discharge: 2018-04-08 | Disposition: A | Discharge status: Home / Self Care | Payer: Medicaid Other | Source: Ambulatory Visit | Attending: Urology | Admitting: Urology

## 2018-04-08 DIAGNOSIS — C3491 Malignant neoplasm of unspecified part of right bronchus or lung: Secondary | ICD-10-CM | POA: Insufficient documentation

## 2018-04-08 LAB — GLUCOSE, CAPILLARY: Glucose-Capillary: 132 mg/dL — ABNORMAL HIGH (ref 70–99)

## 2018-04-08 MED ORDER — FLUDEOXYGLUCOSE F - 18 (FDG) INJECTION
4.9700 | Freq: Once | INTRAVENOUS | Status: AC | PRN
  Administered 2018-04-08: 4.97 via INTRAVENOUS

## 2018-04-09 ENCOUNTER — Emergency Department (HOSPITAL_COMMUNITY): Payer: Medicaid Other

## 2018-04-09 ENCOUNTER — Inpatient Hospital Stay (HOSPITAL_COMMUNITY)
Admission: EM | Admit: 2018-04-09 | Discharge: 2018-04-14 | DRG: 180 | Disposition: A | Discharge status: Home Health | Payer: Medicaid Other | Attending: Internal Medicine | Admitting: Internal Medicine

## 2018-04-09 ENCOUNTER — Other Ambulatory Visit: Payer: Self-pay

## 2018-04-09 ENCOUNTER — Encounter (HOSPITAL_COMMUNITY): Payer: Self-pay

## 2018-04-09 ENCOUNTER — Ambulatory Visit
Admission: RE | Admit: 2018-04-09 | Discharge: 2018-04-09 | Disposition: A | Discharge status: Home / Self Care | Payer: Medicaid Other | Source: Ambulatory Visit | Attending: Radiation Oncology | Admitting: Radiation Oncology

## 2018-04-09 DIAGNOSIS — R0603 Acute respiratory distress: Secondary | ICD-10-CM | POA: Diagnosis not present

## 2018-04-09 DIAGNOSIS — G8929 Other chronic pain: Secondary | ICD-10-CM | POA: Diagnosis present

## 2018-04-09 DIAGNOSIS — Z79899 Other long term (current) drug therapy: Secondary | ICD-10-CM | POA: Diagnosis not present

## 2018-04-09 DIAGNOSIS — Z7901 Long term (current) use of anticoagulants: Secondary | ICD-10-CM

## 2018-04-09 DIAGNOSIS — J439 Emphysema, unspecified: Secondary | ICD-10-CM | POA: Diagnosis present

## 2018-04-09 DIAGNOSIS — E871 Hypo-osmolality and hyponatremia: Secondary | ICD-10-CM | POA: Diagnosis present

## 2018-04-09 DIAGNOSIS — Z86718 Personal history of other venous thrombosis and embolism: Secondary | ICD-10-CM | POA: Diagnosis not present

## 2018-04-09 DIAGNOSIS — I82629 Acute embolism and thrombosis of deep veins of unspecified upper extremity: Secondary | ICD-10-CM | POA: Diagnosis present

## 2018-04-09 DIAGNOSIS — D72829 Elevated white blood cell count, unspecified: Secondary | ICD-10-CM | POA: Diagnosis not present

## 2018-04-09 DIAGNOSIS — I10 Essential (primary) hypertension: Secondary | ICD-10-CM | POA: Diagnosis present

## 2018-04-09 DIAGNOSIS — J189 Pneumonia, unspecified organism: Secondary | ICD-10-CM | POA: Diagnosis present

## 2018-04-09 DIAGNOSIS — J9811 Atelectasis: Secondary | ICD-10-CM | POA: Diagnosis present

## 2018-04-09 DIAGNOSIS — J9621 Acute and chronic respiratory failure with hypoxia: Secondary | ICD-10-CM | POA: Diagnosis present

## 2018-04-09 DIAGNOSIS — I871 Compression of vein: Secondary | ICD-10-CM | POA: Diagnosis present

## 2018-04-09 DIAGNOSIS — J9601 Acute respiratory failure with hypoxia: Secondary | ICD-10-CM

## 2018-04-09 DIAGNOSIS — D630 Anemia in neoplastic disease: Secondary | ICD-10-CM | POA: Diagnosis present

## 2018-04-09 DIAGNOSIS — R0602 Shortness of breath: Secondary | ICD-10-CM

## 2018-04-09 DIAGNOSIS — Z87891 Personal history of nicotine dependence: Secondary | ICD-10-CM | POA: Diagnosis not present

## 2018-04-09 DIAGNOSIS — C3491 Malignant neoplasm of unspecified part of right bronchus or lung: Secondary | ICD-10-CM | POA: Diagnosis present

## 2018-04-09 DIAGNOSIS — M419 Scoliosis, unspecified: Secondary | ICD-10-CM | POA: Diagnosis present

## 2018-04-09 DIAGNOSIS — Z8249 Family history of ischemic heart disease and other diseases of the circulatory system: Secondary | ICD-10-CM | POA: Diagnosis not present

## 2018-04-09 DIAGNOSIS — J441 Chronic obstructive pulmonary disease with (acute) exacerbation: Secondary | ICD-10-CM

## 2018-04-09 DIAGNOSIS — I480 Paroxysmal atrial fibrillation: Secondary | ICD-10-CM | POA: Diagnosis present

## 2018-04-09 DIAGNOSIS — N200 Calculus of kidney: Secondary | ICD-10-CM | POA: Diagnosis present

## 2018-04-09 DIAGNOSIS — E43 Unspecified severe protein-calorie malnutrition: Secondary | ICD-10-CM | POA: Diagnosis present

## 2018-04-09 DIAGNOSIS — Z79891 Long term (current) use of opiate analgesic: Secondary | ICD-10-CM

## 2018-04-09 DIAGNOSIS — D649 Anemia, unspecified: Secondary | ICD-10-CM | POA: Diagnosis present

## 2018-04-09 DIAGNOSIS — I82623 Acute embolism and thrombosis of deep veins of upper extremity, bilateral: Secondary | ICD-10-CM | POA: Diagnosis not present

## 2018-04-09 LAB — CBC WITH DIFFERENTIAL/PLATELET
Abs Immature Granulocytes: 0.08 10*3/uL — ABNORMAL HIGH (ref 0.00–0.07)
BASOS PCT: 0 %
Basophils Absolute: 0 10*3/uL (ref 0.0–0.1)
EOS ABS: 0.1 10*3/uL (ref 0.0–0.5)
EOS PCT: 1 %
HCT: 23.4 % — ABNORMAL LOW (ref 36.0–46.0)
Hemoglobin: 7.5 g/dL — ABNORMAL LOW (ref 12.0–15.0)
Immature Granulocytes: 1 %
Lymphocytes Relative: 3 %
Lymphs Abs: 0.3 10*3/uL — ABNORMAL LOW (ref 0.7–4.0)
MCH: 30.5 pg (ref 26.0–34.0)
MCHC: 32.1 g/dL (ref 30.0–36.0)
MCV: 95.1 fL (ref 80.0–100.0)
MONO ABS: 0.8 10*3/uL (ref 0.1–1.0)
Monocytes Relative: 8 %
NEUTROS ABS: 9.5 10*3/uL — AB (ref 1.7–7.7)
Neutrophils Relative %: 87 %
PLATELETS: 303 10*3/uL (ref 150–400)
RBC: 2.46 MIL/uL — AB (ref 3.87–5.11)
RDW: 15.2 % (ref 11.5–15.5)
WBC: 10.8 10*3/uL — AB (ref 4.0–10.5)
nRBC: 0 % (ref 0.0–0.2)

## 2018-04-09 LAB — COMPREHENSIVE METABOLIC PANEL
ALK PHOS: 56 U/L (ref 38–126)
ALT: 10 U/L (ref 0–44)
AST: 11 U/L — AB (ref 15–41)
Albumin: 2.5 g/dL — ABNORMAL LOW (ref 3.5–5.0)
Anion gap: 8 (ref 5–15)
BILIRUBIN TOTAL: 0.4 mg/dL (ref 0.3–1.2)
BUN: 10 mg/dL (ref 8–23)
CALCIUM: 8.7 mg/dL — AB (ref 8.9–10.3)
CO2: 26 mmol/L (ref 22–32)
CREATININE: 0.58 mg/dL (ref 0.44–1.00)
Chloride: 96 mmol/L — ABNORMAL LOW (ref 98–111)
GFR calc Af Amer: >60 mL/min (ref >60)
Glucose, Bld: 142 mg/dL — ABNORMAL HIGH (ref 70–99)
POTASSIUM: 3.7 mmol/L (ref 3.5–5.1)
Sodium: 130 mmol/L — ABNORMAL LOW (ref 135–145)
TOTAL PROTEIN: 7 g/dL (ref 6.5–8.1)

## 2018-04-09 LAB — BRAIN NATRIURETIC PEPTIDE: B Natriuretic Peptide: 37.8 pg/mL (ref 0.0–100.0)

## 2018-04-09 LAB — TROPONIN I: Troponin I: <0.03 ng/mL (ref <0.03)

## 2018-04-09 LAB — CBG MONITORING, ED: Glucose-Capillary: 136 mg/dL — ABNORMAL HIGH (ref 70–99)

## 2018-04-09 LAB — GLUCOSE, CAPILLARY: Glucose-Capillary: 178 mg/dL — ABNORMAL HIGH (ref 70–99)

## 2018-04-09 MED ORDER — INSULIN ASPART 100 UNIT/ML ~~LOC~~ SOLN
0.0000 [IU] | Freq: Three times a day (TID) | SUBCUTANEOUS | Status: DC
  Administered 2018-04-09: 2 [IU] via SUBCUTANEOUS
  Administered 2018-04-09: 1 [IU] via SUBCUTANEOUS
  Administered 2018-04-10: 2 [IU] via SUBCUTANEOUS
  Administered 2018-04-10 – 2018-04-13 (×5): 1 [IU] via SUBCUTANEOUS
  Administered 2018-04-14: 2 [IU] via SUBCUTANEOUS
  Filled 2018-04-09: qty 1

## 2018-04-09 MED ORDER — PREDNISONE 20 MG PO TABS
60.0000 mg | ORAL_TABLET | Freq: Every day | ORAL | Status: DC
  Administered 2018-04-09: 60 mg via ORAL
  Filled 2018-04-09: qty 3

## 2018-04-09 MED ORDER — DILTIAZEM HCL ER COATED BEADS 120 MG PO CP24
120.0000 mg | ORAL_CAPSULE | Freq: Every day | ORAL | Status: DC
  Administered 2018-04-10 – 2018-04-11 (×2): 120 mg via ORAL
  Filled 2018-04-09 (×2): qty 1

## 2018-04-09 MED ORDER — PREDNISONE 50 MG PO TABS
50.0000 mg | ORAL_TABLET | Freq: Every day | ORAL | Status: DC
  Administered 2018-04-09 – 2018-04-10 (×2): 50 mg via ORAL
  Filled 2018-04-09: qty 3
  Filled 2018-04-09: qty 1

## 2018-04-09 MED ORDER — ONDANSETRON HCL 4 MG/2ML IJ SOLN: 4.0000 mg | Freq: Four times a day (QID) | INTRAMUSCULAR | Status: DC | PRN

## 2018-04-09 MED ORDER — IPRATROPIUM-ALBUTEROL 0.5-2.5 (3) MG/3ML IN SOLN
3.0000 mL | Freq: Four times a day (QID) | RESPIRATORY_TRACT | Status: DC
  Administered 2018-04-09 – 2018-04-10 (×4): 3 mL via RESPIRATORY_TRACT
  Filled 2018-04-09 (×4): qty 3

## 2018-04-09 MED ORDER — APIXABAN 5 MG PO TABS
5.0000 mg | ORAL_TABLET | Freq: Two times a day (BID) | ORAL | Status: DC
  Administered 2018-04-09 – 2018-04-14 (×11): 5 mg via ORAL
  Filled 2018-04-09 (×12): qty 1

## 2018-04-09 MED ORDER — OXYCODONE-ACETAMINOPHEN 5-325 MG PO TABS
1.0000 | ORAL_TABLET | Freq: Three times a day (TID) | ORAL | Status: DC | PRN
  Administered 2018-04-09 – 2018-04-14 (×11): 1 via ORAL
  Filled 2018-04-09 (×11): qty 1

## 2018-04-09 MED ORDER — ONDANSETRON HCL 4 MG PO TABS: 4.0000 mg | ORAL_TABLET | Freq: Four times a day (QID) | ORAL | Status: DC | PRN

## 2018-04-09 MED ORDER — IPRATROPIUM-ALBUTEROL 0.5-2.5 (3) MG/3ML IN SOLN
3.0000 mL | Freq: Once | RESPIRATORY_TRACT | Status: AC
  Administered 2018-04-09: 3 mL via RESPIRATORY_TRACT
  Filled 2018-04-09: qty 3

## 2018-04-09 MED ORDER — IPRATROPIUM-ALBUTEROL 0.5-2.5 (3) MG/3ML IN SOLN
3.0000 mL | RESPIRATORY_TRACT | Status: DC | PRN
  Administered 2018-04-10 – 2018-04-14 (×4): 3 mL via RESPIRATORY_TRACT
  Filled 2018-04-09 (×5): qty 3

## 2018-04-09 MED ORDER — ENSURE ENLIVE PO LIQD
237.0000 mL | Freq: Two times a day (BID) | ORAL | Status: DC
  Administered 2018-04-09 – 2018-04-14 (×8): 237 mL via ORAL
  Filled 2018-04-09: qty 237

## 2018-04-09 MED ORDER — ACETAMINOPHEN 650 MG RE SUPP: 650.0000 mg | Freq: Four times a day (QID) | RECTAL | Status: DC | PRN

## 2018-04-09 MED ORDER — SENNOSIDES-DOCUSATE SODIUM 8.6-50 MG PO TABS: 1.0000 | ORAL_TABLET | Freq: Every evening | ORAL | Status: DC | PRN

## 2018-04-09 MED ORDER — BUDESONIDE 0.5 MG/2ML IN SUSP
0.5000 mg | Freq: Two times a day (BID) | RESPIRATORY_TRACT | Status: DC
  Administered 2018-04-09 – 2018-04-14 (×11): 0.5 mg via RESPIRATORY_TRACT
  Filled 2018-04-09 (×12): qty 2

## 2018-04-09 MED ORDER — BISACODYL 5 MG PO TBEC
5.0000 mg | DELAYED_RELEASE_TABLET | Freq: Every day | ORAL | Status: DC | PRN
  Filled 2018-04-09: qty 1

## 2018-04-09 MED ORDER — ACETAMINOPHEN 325 MG PO TABS
650.0000 mg | ORAL_TABLET | Freq: Four times a day (QID) | ORAL | Status: DC | PRN
  Filled 2018-04-09 (×2): qty 2

## 2018-04-09 NOTE — ED Notes (Signed)
Bed: YT24 Expected date:  Expected time:  Means of arrival:  Comments: Res A

## 2018-04-09 NOTE — ED Notes (Signed)
Bed: RESA Expected date:  Expected time:  Means of arrival:  Comments: Respiratory distress

## 2018-04-09 NOTE — ED Provider Notes (Signed)
[facility] DEPT Provider Note   CSN: [id number] Arrival date & time: 04/09/18  8921     History   Chief Complaint Chief Complaint  Patient presents with  . Shortness of Breath    HPI Sonya [name] is a 62 y.o. female.  The history is provided by the patient.  Shortness of Breath  This is a recurrent problem. The problem occurs intermittently.The current episode started 3 to 5 hours ago. The problem has been gradually improving. Associated symptoms include cough and wheezing. Pertinent negatives include no fever, no headaches, no coryza, no rhinorrhea, no sore throat, no ear pain, no neck pain, no sputum production, no hemoptysis, no PND, no orthopnea, no chest pain, no syncope, no vomiting, no abdominal pain, no rash, no leg pain, no leg swelling and no claudication. It is unknown (Right lung mass and currently undergoing radiation therapy. ) what precipitated the problem. She has tried beta-agonist inhalers for the symptoms. The treatment provided moderate relief. She has had prior hospitalizations. She has had prior ED visits. Associated medical issues include COPD, heart failure and DVT (on eloquis).    Past Medical History:  Diagnosis Date  . Atrial fibrillation ([place])   . Bronchitis   . Chronic back pain   . COPD (chronic obstructive pulmonary disease) ([place])   . HTN (hypertension)   . Lung cancer ([place])   . Mass of upper lobe of right lung   . Scoliosis     Patient Active Problem List   Diagnosis Date Noted  . Non-small cell carcinoma of right lung, stage 3 ([place]) 04/02/2018  . Acute deep vein thrombosis (DVT) of axillary vein of left upper extremity ([place]) 03/25/2018  . Acute deep vein thrombosis (DVT) of axillary vein of right upper extremity ([place]) 03/25/2018  . Hypercoagulable state, secondary ([place]) 03/25/2018  . 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 Acute deep vein thrombosis (DVT) of upper extremity ([place]) 03/24/2018  .  Tachycardia 03/20/2018  . New onset atrial fibrillation ([place]) 03/20/2018  . Atrial fibrillation with RVR ([place])   . COPD with acute exacerbation ([place])   . Hypertensive urgency 03/18/2018  . HTN (hypertension) 03/18/2018  . Lung mass 03/18/2018  . 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 Pulmonary mass 03/17/2018  . Chronic anemia 03/17/2018  . Hypercalcemia 03/17/2018  . Tobacco abuse 06/20/2015  . Chronic back pain     Past Surgical History:  Procedure Laterality Date  . ENDOBRONCHIAL ULTRASOUND Bilateral 03/26/2018   Procedure: ENDOBRONCHIAL ULTRASOUND;  Surgeon: Margaretha Seeds, MD;  Location: [facility];  Service: Pulmonary;  Laterality: Bilateral;     OB History   None      Home Medications    Prior to Admission medications   Medication Sig Start Date End Date Taking? Authorizing Provider  albuterol (PROVENTIL HFA;VENTOLIN HFA) 108 (90 Base) MCG/ACT inhaler Inhale 2 puffs into the lungs every 6 (six) hours as needed for wheezing or shortness of breath. 04/02/18  Yes Scot Jun, FNP  albuterol (PROVENTIL) (2.5 MG/3ML) 0.083% nebulizer solution Take 3 mLs (2.5 mg total) by nebulization every 6 (six) hours as needed for wheezing or shortness of breath. 04/02/18  Yes Scot Jun, FNP  apixaban (ELIQUIS) 5 MG TABS tablet Take 1 tablet (5 mg total) by mouth 2 (two) times daily. 04/03/18  Yes Charlynne Cousins, MD  diltiazem (CARDIZEM CD) 120 MG 24 hr capsule Take 1 capsule (120 mg total) by mouth daily. 03/28/18  Yes Aileen Fass,  Tammi Klippel, MD  feeding supplement, ENSURE ENLIVE, (ENSURE ENLIVE) LIQD Take 237 mLs by mouth 2 (two) times daily between meals. 03/28/18  Yes Charlynne Cousins, MD  oxyCODONE-acetaminophen (PERCOCET/ROXICET) 5-325 MG tablet Take 1 tablet by mouth every 8 (eight) hours as needed for severe pain. Patient taking differently: Take 2 tablets by mouth every 8 (eight) hours as needed for severe pain.  12/04/17  Yes McDonald, Mia A, PA-C    apixaban (ELIQUIS) 5 MG TABS tablet Take 2 tablets (10 mg total) by mouth 2 (two) times daily for 6 days. 03/28/18 04/03/18  Charlynne Cousins, MD  hydrALAZINE (APRESOLINE) 20 MG/ML injection Inject 0.5 mLs (10 mg total) into the vein every 6 (six) hours as needed (SBP > 180 OR DBP > 110). Patient not taking: Reported on 04/03/2018 03/28/18   Charlynne Cousins, MD  ipratropium (ATROVENT) 0.02 % nebulizer solution Take 2.5 mLs (0.5 mg total) by nebulization 4 (four) times daily. 04/02/18   Scot Jun, FNP    Family History Family History  Problem Relation Age of Onset  . Heart disease Brother   . Cancer Brother   . Cancer Sister   . Cancer Brother     Social History Social History   Tobacco Use  . Smoking status: Former Smoker    Packs/day: 0.25    Years: 40.00    Pack years: 10.00    Types: Cigarettes    Last attempt to quit: 03/03/2018    Years since quitting: 0.1  . Smokeless tobacco: Never Used  Substance Use Topics  . Alcohol use: Not Currently  . Drug use: No     Allergies   Patient has no known allergies.   Review of Systems Review of Systems  Constitutional: Negative for chills and fever.  HENT: Negative for ear pain, rhinorrhea and sore throat.   Eyes: Negative for pain and visual disturbance.  Respiratory: Positive for cough, shortness of breath and wheezing. Negative for hemoptysis and sputum production.   Cardiovascular: Negative for chest pain, palpitations, orthopnea, claudication, leg swelling, syncope and PND.  Gastrointestinal: Negative for abdominal pain and vomiting.  Genitourinary: Negative for dysuria and hematuria.  Musculoskeletal: Negative for arthralgias, back pain and neck pain.  Skin: Negative for color change and rash.  Neurological: Negative for seizures, syncope and headaches.  All other systems reviewed and are negative.    Physical Exam Updated Vital Signs  ED Triage Vitals  Enc Vitals Group     BP 04/09/18 0640  134/63     Pulse Rate 04/09/18 0640 95     Resp 04/09/18 0640 16     Temp 04/09/18 0640 (!) 97.5 F (36.4 C)     Temp Source 04/09/18 0640 Oral     SpO2 04/09/18 0633 100 %     Weight 04/09/18 0642 101 lb (45.8 kg)     Height 04/09/18 0642 5\' 9"  (1.753 m)     Head Circumference --      Peak Flow --      Pain Score 04/09/18 0642 0     Pain Loc --      Pain Edu? --      Excl. in [place]? --     Physical Exam  Constitutional: She appears well-developed and well-nourished. No distress.  HENT:  Head: Normocephalic and atraumatic.  Mouth/Throat: No oropharyngeal exudate or posterior oropharyngeal edema.  Eyes: Pupils are equal, round, and reactive to light. Conjunctivae and EOM are normal.  Neck: Normal range of motion.  Neck supple.  Cardiovascular: Normal rate and regular rhythm.  No murmur heard. Pulmonary/Chest: Effort normal. No respiratory distress. She has decreased breath sounds in the right upper field, the right middle field and the right lower field. She has wheezes. She has no rhonchi. She has no rales.  Abdominal: Soft. There is no tenderness.  Musculoskeletal: She exhibits no edema.       Right lower leg: She exhibits no edema.       Left lower leg: She exhibits no edema.  Neurological: She is alert.  Skin: Skin is warm and dry.  Psychiatric: She has a normal mood and affect.  Nursing note and vitals reviewed.    ED Treatments / Results  Labs (all labs ordered are listed, but only abnormal results are displayed) Labs Reviewed  COMPREHENSIVE METABOLIC PANEL - Abnormal; Notable for the following components:      Result Value   Sodium 130 (*)    Chloride 96 (*)    Glucose, Bld 142 (*)    Calcium 8.7 (*)    Albumin 2.5 (*)    AST 11 (*)    All other components within normal limits  CBC WITH DIFFERENTIAL/PLATELET - Abnormal; Notable for the following components:   WBC 10.8 (*)    RBC 2.46 (*)    Hemoglobin 7.5 (*)    HCT 23.4 (*)    Neutro Abs 9.5 (*)    Lymphs  Abs 0.3 (*)    Abs Immature Granulocytes 0.08 (*)    All other components within normal limits  BRAIN NATRIURETIC PEPTIDE  TROPONIN I  I-STAT TROPONIN, ED    EKG EKG Interpretation  Date/Time:  Thursday April 09 2018 07:15:34 EST Ventricular Rate:  92 PR Interval:    QRS Duration: 76 QT Interval:  345 QTC Calculation: 427 R Axis:   70 Text Interpretation:  Sinus rhythm Atrial premature complex Probable left atrial enlargement Confirmed by Lennice Sites [phone]) on 04/09/2018 7:19:40 AM   Radiology Dg Chest 2 View  Result Date: 04/09/2018 CLINICAL DATA:  Shortness of breath and productive cough EXAM: CHEST - 2 VIEW COMPARISON:  03/18/2018 FINDINGS: Known bulky necrotic mass at the right apex. No acute opacity, edema, or effusion. Normal heart size. There is hyperinflation from COPD IMPRESSION: 1. Known necrotic mass at the right apex. The mass narrows the lower trachea. 2. No new abnormality. Electronically Signed   By: Monte Fantasia M.D.   On: 04/09/2018 07:37   Nm Pet Image Initial (pi) Skull Base To Thigh  Result Date: 04/08/2018 CLINICAL DATA:  Initial treatment strategy for lung cancer. EXAM: NUCLEAR MEDICINE PET SKULL BASE TO THIGH TECHNIQUE: 5.0 mCi F-18 FDG was injected intravenously. Full-ring PET imaging was performed from the skull base to thigh after the radiotracer. CT data was obtained and used for attenuation correction and anatomic localization. Fasting blood glucose: 132 mg/dl COMPARISON:  CT chest abdomen pelvis 03/17/2018. FINDINGS: Mediastinal blood pool activity: SUV max 1.6 NECK: No abnormal hypermetabolism in the neck. Incidental CT findings: None. CHEST: Bilateral mediastinal lymph nodes show minimal hypermetabolism, with an SUV max of 2.2 in a 10 mm right paratracheal lymph node (CT image 41). A large necrotic mass occupies the right upper lobe, extends into the mediastinum and to the carina, and measures at least 7.3 x 9.7 cm, with an SUV max of 12.6. 0.9 x  1.6 cm left upper lobe nodule (CT image 20) has an SUV max of 6.0. Small area of patchy ground-glass in the superior  segment left lower lobe is minimally hypermetabolic and nonspecific. Incidental CT findings: Atherosclerotic calcification of the arterial vasculature. Pulmonic trunk is enlarged. No pericardial effusion. Tiny right pleural effusion. Obstruction of the right upper lobe bronchus and marked narrowing of the right middle lobe bronchus, due to tumor. New near-total postobstructive collapse/consolidation in the right middle lobe. Debris in the right lower lobe bronchus with peribronchovascular nodularity in the right lower lobe. ABDOMEN/PELVIS: No abnormal hypermetabolism in the liver, adrenal glands, spleen or pancreas. No hypermetabolic lymph nodes. Incidental CT findings: Liver and gallbladder are grossly unremarkable. Difficult to exclude bilateral adrenal thickening. Tiny stones in the right kidney. Low-attenuation lesions in the kidneys are difficult to characterize without post-contrast imaging. Prominent extrarenal pelves. Spleen, pancreas, stomach and bowel are grossly unremarkable. Atherosclerotic calcification of the arterial vasculature without abdominal aortic aneurysm. SKELETON: No abnormal osseous hypermetabolism. Incidental CT findings: None. IMPRESSION: 1. Large necrotic hypermetabolic right upper lobe mass with mediastinal and airway invasion. Associated minimally hypermetabolic mediastinal lymph nodes and hypermetabolic left upper lobe nodule. Findings are most consistent with stage IV primary bronchogenic carcinoma. 2. Tiny right pleural effusion. 3. Right-sided airway narrowing/obstruction, as detailed above, with new near total collapse/consolidation of the right middle lobe and likely postobstructive peribronchovascular nodularity in the right lower lobe. 4. Right renal stones. 5.  Aortic atherosclerosis (ICD10-170.0). 6. Enlarged pulmonic trunk, indicative of pulmonary arterial  hypertension. Electronically Signed   By: Lorin Picket M.D.   On: 04/08/2018 09:50    Procedures .Critical Care Performed by: Lennice Sites, DO Authorized by: Lennice Sites, DO   Critical care provider statement:    Critical care time (minutes):  35   Critical care time was exclusive of:  Separately billable procedures and treating other patients and teaching time   Critical care was necessary to treat or prevent imminent or life-threatening deterioration of the following conditions:  Respiratory failure   Critical care was time spent personally by me on the following activities:  Development of treatment plan with patient or surrogate, discussions with primary provider, evaluation of patient's response to treatment, ordering and performing treatments and interventions, ordering and review of laboratory studies, ordering and review of radiographic studies, pulse oximetry, re-evaluation of patient's condition and review of old charts   I assumed direction of critical care for this patient from another provider in my specialty: no     (including critical care time)  Medications Ordered in ED Medications  predniSONE (DELTASONE) tablet 60 mg (60 mg Oral Given 04/09/18 0756)  ipratropium-albuterol (DUONEB) 0.5-2.5 (3) MG/3ML nebulizer solution 3 mL (3 mLs Nebulization Given 04/09/18 0817)     Initial Impression / Assessment and Plan / ED Course  I have reviewed the triage vital signs and the nursing notes.  Pertinent labs & imaging results that were available during my care of the patient were reviewed by me and considered in my medical decision making (see chart for details).     Kevyn Wengert is a 62 year old female history of COPD, bronchitis, chronic back pain, recent diagnosis of right upper lobe mass involving the SVC that she is currently undergoing radiation treatment who presents the ED with shortness of breath.  Patient hypoxic upon arrival with O2 sats in the 87-88  that improved on 2 L of oxygen.  Patient ready received a breathing treatment with EMS with improvement.  Patient denies any chest pain, abdominal pain.  Has overall good work of breathing on exam.  Has diminished breath sounds on the right side with some  wheezing scattered throughout.  Patient with no signs of peripheral edema.  No signs of obvious volume overload on exam.  Patient with EKG that shows sinus rhythm.  No signs of ischemic changes.  Patient with chest x-ray that shows no acute findings.  No obvious pneumonia, pneumothorax, pleural effusion.  Continues to have right lung mass.  Patient is due for radiation treatment tonight.  Patient was given additional breathing treatment, steroids for possible COPD exacerbation.  Patient likely respiratory failure from chronic lung process versus COPD.  Patient is on Eliquis and low concern for PE at this time.  Patient was not sent home on home oxygen.  Patient had PET scan yesterday that showed total collapse of the right middle lobe which is new from prior scan.  Likely the cause of new hypoxia today as well.  Patient with no significant anemia, electrolyte abnormality, kidney injury.  Patient with troponin within normal limits, BNP within normal limits.  Patient given additional breathing treatment and steroids and states that she is starting to feel better.  Suspect COPD and underlying lung process causing hypoxia.  Patient to be admitted for further care given hypoxia.  Hemodynamically improved throughout my care.  This chart was dictated using voice recognition software.  Despite best efforts to proofread,  errors can occur which can change the documentation meaning.   Final Clinical Impressions(s) / ED Diagnoses   Final diagnoses:  Acute respiratory failure with hypoxia [facility])  COPD exacerbation [facility])    ED Discharge Orders    None       Lennice Sites, DO 04/09/18 0920

## 2018-04-09 NOTE — ED Notes (Signed)
Spoke to Inpatient RN accepting patient. Floor short staffed and new nurse coming at 4 pm. Will attempt report again at that time.

## 2018-04-09 NOTE — Progress Notes (Signed)
Spoke with Raven, RN caring for patient today in ED. She reports the patient is in holding waiting to be transferred to [place], RN confirms the patient is stable and may be transported for radiation therapy then back to ED until bed opens up. Informed Candace, RT and Ciarah, RT of this finding.

## 2018-04-09 NOTE — ED Notes (Signed)
Bed: QJ19 Expected date:  Expected time:  Means of arrival:  Comments:

## 2018-04-09 NOTE — ED Notes (Signed)
Inpatient unit called, waiting on RN to assume assignment of patient. Floor given this RN number when ready for report.

## 2018-04-09 NOTE — ED Notes (Signed)
Report given to 5E. Pt. To go to radiation then to 5E.

## 2018-04-09 NOTE — H&P (Signed)
History and Physical    Evonne Rinks ENI:[id number] DOB: 1956/04/05 DOA: 04/09/2018  PCP: Scot Jun, FNP Patient coming from: Home  Chief Complaint: Shortness of breath  HPI: Sonya [name] is a 62 y.o. 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  Patient states yesterday evening she acutely felt short of breath even at rest along with nonproductive coughing therefore came to the hospital for further evaluation.  Upon admission she was noted to be hypoxic saturating in low 80s on room air therefore had to be placed on 3 L nasal cannula which improved her saturation.  She was given multiple rounds of breathing treatment along with steroids.  She felt little better afterwards but still remained hypoxic on room air requiring supplemental oxygen.  Her labs were overall unremarkable.  She initially presented in July 2019 with back pain when she was diagnosed with large right upper lobe/suprahilar mass involving the bronchus, pulmonary artery and right SVC.  She was supposed to follow-up outpatient but does not appear she did and return back to the ER late October with nonproductive coughing, shortness of breath and weight loss.  At that time CTA showed increase in mass with vascular invasion.  CT of the abdomen pelvis was negative for any acute pathology.  MRI of the brain was also negative for metastases.  Had undergone CT-guided biopsy in [place] which confirmed squamous cell carcinoma of the right lung.  She was originally started on radiation treatment earlier this week.  Social history-quit smoking about a month ago, denies any alcohol or illicit drug use.   Review of Systems: As per HPI otherwise 10 point review of systems negative.  Review of  Systems's Otherwise negative except as per HPI, including: General: Denies fever, chills, night sweats or unintended weight loss. Resp: Denies  wheezing Cardiac: Denies chest pain, palpitations, orthopnea, paroxysmal nocturnal dyspnea. GI: Denies abdominal pain, nausea, vomiting, diarrhea or constipation GU: Denies dysuria, frequency, hesitancy or incontinence MS: Denies muscle aches, joint pain or swelling Neuro: Denies headache, neurologic deficits (focal weakness, numbness, tingling), abnormal gait Psych: Denies anxiety, depression, SI/HI/AVH Skin: Denies new rashes or lesions ID: Denies sick contacts, exotic exposures, travel  Past Medical History:  Diagnosis Date  . Atrial fibrillation ([place])   . Bronchitis   . Chronic back pain   . COPD (chronic obstructive pulmonary disease) ([place])   . HTN (hypertension)   . Lung cancer ([place])   . Mass of upper lobe of right lung   . Scoliosis     Past Surgical History:  Procedure Laterality Date  . ENDOBRONCHIAL ULTRASOUND Bilateral 03/26/2018   Procedure: ENDOBRONCHIAL ULTRASOUND;  Surgeon: Margaretha Seeds, MD;  Location: [facility];  Service: Pulmonary;  Laterality: Bilateral;    SOCIAL HISTORY:  reports that she quit smoking about 5 weeks ago. Her smoking use included cigarettes. She has a 10.00 pack-year smoking history. She has never used smokeless tobacco. She reports that she drank alcohol. She reports that she does not use drugs.  No Known Allergies  FAMILY HISTORY: Family History  Problem Relation Age of Onset  . Heart disease Brother   . Cancer Brother   . Cancer Sister   . Cancer Brother      Prior to Admission medications   Medication Sig Start Date End Date Taking? Authorizing  Provider  albuterol (PROVENTIL HFA;VENTOLIN HFA) 108 (90 Base) MCG/ACT inhaler Inhale 2 puffs into the lungs every 6 (six) hours as needed for wheezing or shortness of breath. 04/02/18  Yes Scot Jun, FNP  albuterol (PROVENTIL) (2.5  MG/3ML) 0.083% nebulizer solution Take 3 mLs (2.5 mg total) by nebulization every 6 (six) hours as needed for wheezing or shortness of breath. 04/02/18  Yes Scot Jun, FNP  apixaban (ELIQUIS) 5 MG TABS tablet Take 1 tablet (5 mg total) by mouth 2 (two) times daily. 04/03/18  Yes Charlynne Cousins, MD  diltiazem (CARDIZEM CD) 120 MG 24 hr capsule Take 1 capsule (120 mg total) by mouth daily. 03/28/18  Yes Charlynne Cousins, MD  feeding supplement, ENSURE ENLIVE, (ENSURE ENLIVE) LIQD Take 237 mLs by mouth 2 (two) times daily between meals. 03/28/18  Yes Charlynne Cousins, MD  oxyCODONE-acetaminophen (PERCOCET/ROXICET) 5-325 MG tablet Take 1 tablet by mouth every 8 (eight) hours as needed for severe pain. Patient taking differently: Take 2 tablets by mouth every 8 (eight) hours as needed for severe pain.  12/04/17  Yes McDonald, Mia A, PA-C  apixaban (ELIQUIS) 5 MG TABS tablet Take 2 tablets (10 mg total) by mouth 2 (two) times daily for 6 days. 03/28/18 04/03/18  Charlynne Cousins, MD  hydrALAZINE (APRESOLINE) 20 MG/ML injection Inject 0.5 mLs (10 mg total) into the vein every 6 (six) hours as needed (SBP > 180 OR DBP > 110). Patient not taking: Reported on 04/03/2018 03/28/18   Charlynne Cousins, MD  ipratropium (ATROVENT) 0.02 % nebulizer solution Take 2.5 mLs (0.5 mg total) by nebulization 4 (four) times daily. 04/02/18   Scot Jun, FNP    Physical Exam: Vitals:   04/09/18 7564 04/09/18 0642 04/09/18 0755 04/09/18 0936  BP: 134/63  106/69 110/68  Pulse: 95  95 95  Resp: 16  16 16   Temp: (!) 97.5 F (36.4 C)     TempSrc: Oral     SpO2: 91%  94% 100%  Weight:  45.8 kg    Height:  5\' 9"  (1.753 m)        Constitutional: NAD, calm, comfortable, on 3 L nasal cannula, cachectic frail-appearing.  Multiple superficial veins throughout her body. Eyes: PERRL, lids and conjunctivae normal ENMT: Mucous membranes are moist. Posterior pharynx clear of any exudate or  lesions.Normal dentition.  Neck: normal, supple, no masses, no thyromegaly Respiratory: Diffuse coarse breath sounds Cardiovascular: Regular rate and rhythm, no murmurs / rubs / gallops. No extremity edema. 2+ pedal pulses. No carotid bruits.  Abdomen: no tenderness, no masses palpated. No hepatosplenomegaly. Bowel sounds positive.  Musculoskeletal: no clubbing / cyanosis. No joint deformity upper and lower extremities. Good ROM, no contractures. Normal muscle tone.  Skin: no rashes, lesions, ulcers. No induration Neurologic: CN 2-12 grossly intact. Sensation intact, DTR normal. Strength 4/5 in all 4.  Psychiatric: Normal judgment and insight. Alert and oriented x 3. Normal mood.     Labs on Admission: I have personally reviewed following labs and imaging studies  CBC: Recent Labs  Lab 04/09/18 0753  WBC 10.8*  NEUTROABS 9.5*  HGB 7.5*  HCT 23.4*  MCV 95.1  PLT 332   Basic Metabolic Panel: Recent Labs  Lab 04/09/18 0753  NA 130*  K 3.7  CL 96*  CO2 26  GLUCOSE 142*  BUN 10  CREATININE 0.58  CALCIUM 8.7*   GFR: Estimated Creatinine Clearance: 52.7 mL/min (by C-G formula based on SCr of 0.58 mg/dL).  Liver Function Tests: Recent Labs  Lab 04/09/18 0753  AST 11*  ALT 10  ALKPHOS 56  BILITOT 0.4  PROT 7.0  ALBUMIN 2.5*   No results for input(s): LIPASE, AMYLASE in the last 168 hours. No results for input(s): AMMONIA in the last 168 hours. Coagulation Profile: No results for input(s): INR, PROTIME in the last 168 hours. Cardiac Enzymes: Recent Labs  Lab 04/09/18 0753  TROPONINI <0.03   BNP (last 3 results) No results for input(s): PROBNP in the last 8760 hours. HbA1C: No results for input(s): HGBA1C in the last 72 hours. CBG: Recent Labs  Lab 04/08/18 0740  GLUCAP 132*   Lipid Profile: No results for input(s): CHOL, HDL, LDLCALC, TRIG, CHOLHDL, LDLDIRECT in the last 72 hours. Thyroid Function Tests: No results for input(s): TSH, T4TOTAL, FREET4,  T3FREE, THYROIDAB in the last 72 hours. Anemia Panel: No results for input(s): VITAMINB12, FOLATE, FERRITIN, TIBC, IRON, RETICCTPCT in the last 72 hours. Urine analysis: No results found for: COLORURINE, APPEARANCEUR, LABSPEC, PHURINE, GLUCOSEU, HGBUR, BILIRUBINUR, KETONESUR, PROTEINUR, UROBILINOGEN, NITRITE, LEUKOCYTESUR Sepsis Labs: !!!!!!!!!!!!!!!!!!!!!!!!!!!!!!!!!!!!!!!!!!!! @LABRCNTIP (procalcitonin:4,lacticidven:4) )No results found for this or any previous visit (from the past 240 hour(s)).   Radiological Exams on Admission: Dg Chest 2 View  Result Date: 04/09/2018 CLINICAL DATA:  Shortness of breath and productive cough EXAM: CHEST - 2 VIEW COMPARISON:  03/18/2018 FINDINGS: Known bulky necrotic mass at the right apex. No acute opacity, edema, or effusion. Normal heart size. There is hyperinflation from COPD IMPRESSION: 1. Known necrotic mass at the right apex. The mass narrows the lower trachea. 2. No new abnormality. Electronically Signed   By: Monte Fantasia M.D.   On: 04/09/2018 07:37   Nm Pet Image Initial (pi) Skull Base To Thigh  Result Date: 04/08/2018 CLINICAL DATA:  Initial treatment strategy for lung cancer. EXAM: NUCLEAR MEDICINE PET SKULL BASE TO THIGH TECHNIQUE: 5.0 mCi F-18 FDG was injected intravenously. Full-ring PET imaging was performed from the skull base to thigh after the radiotracer. CT data was obtained and used for attenuation correction and anatomic localization. Fasting blood glucose: 132 mg/dl COMPARISON:  CT chest abdomen pelvis 03/17/2018. FINDINGS: Mediastinal blood pool activity: SUV max 1.6 NECK: No abnormal hypermetabolism in the neck. Incidental CT findings: None. CHEST: Bilateral mediastinal lymph nodes show minimal hypermetabolism, with an SUV max of 2.2 in a 10 mm right paratracheal lymph node (CT image 41). A large necrotic mass occupies the right upper lobe, extends into the mediastinum and to the carina, and measures at least 7.3 x 9.7 cm, with an SUV  max of 12.6. 0.9 x 1.6 cm left upper lobe nodule (CT image 20) has an SUV max of 6.0. Small area of patchy ground-glass in the superior segment left lower lobe is minimally hypermetabolic and nonspecific. Incidental CT findings: Atherosclerotic calcification of the arterial vasculature. Pulmonic trunk is enlarged. No pericardial effusion. Tiny right pleural effusion. Obstruction of the right upper lobe bronchus and marked narrowing of the right middle lobe bronchus, due to tumor. New near-total postobstructive collapse/consolidation in the right middle lobe. Debris in the right lower lobe bronchus with peribronchovascular nodularity in the right lower lobe. ABDOMEN/PELVIS: No abnormal hypermetabolism in the liver, adrenal glands, spleen or pancreas. No hypermetabolic lymph nodes. Incidental CT findings: Liver and gallbladder are grossly unremarkable. Difficult to exclude bilateral adrenal thickening. Tiny stones in the right kidney. Low-attenuation lesions in the kidneys are difficult to characterize without post-contrast imaging. Prominent extrarenal pelves. Spleen, pancreas, stomach and bowel are grossly unremarkable. Atherosclerotic  calcification of the arterial vasculature without abdominal aortic aneurysm. SKELETON: No abnormal osseous hypermetabolism. Incidental CT findings: None. IMPRESSION: 1. Large necrotic hypermetabolic right upper lobe mass with mediastinal and airway invasion. Associated minimally hypermetabolic mediastinal lymph nodes and hypermetabolic left upper lobe nodule. Findings are most consistent with stage IV primary bronchogenic carcinoma. 2. Tiny right pleural effusion. 3. Right-sided airway narrowing/obstruction, as detailed above, with new near total collapse/consolidation of the right middle lobe and likely postobstructive peribronchovascular nodularity in the right lower lobe. 4. Right renal stones. 5.  Aortic atherosclerosis (ICD10-170.0). 6. Enlarged pulmonic trunk, indicative of  pulmonary arterial hypertension. Electronically Signed   By: Lorin Picket M.D.   On: 04/08/2018 09:50     All images have been reviewed by me personally.   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 Tammi Klippel from radiation oncology, plans for radiation later today -Continue supportive care - I suspect she will likely end up requiring oxygen to go home on. -Continue Eliquis, pain control  Essential hypertension Paroxysmal atrial fibrillation -Cardizem 120 mg orally daily.  Continue Eliquis  Severe protein calorie malnutrition -Encourage oral diet.  Ensure with her food.  DVT prophylaxis: Eliquis Code Status: Full code Family Communication: None at bedside Disposition Plan: To be determined Consults called: Radiation oncology called, Dr. Tammi Klippel. Admission status: MedSurg admission.  Given the extent of the patient's mass causing hypoxia, she requires inpatient treatment at this time.  Although stable now, she is at high risk for decompensation   Time Spent: 65 minutes.  >50% of the time was devoted to discussing the patients care, assessment, plan and disposition with other care givers along with  counseling the patient about the risks and benefits of treatment.    Jaquala Fuller Arsenio Loader MD [facility] Pager [phone]  If 7PM-7AM, please contact night-coverage [email or web address] Password [email or web address]  04/09/2018, 9:50 AM

## 2018-04-09 NOTE — ED Triage Notes (Signed)
Pt was brought by [facility] due to respiratory distress at home. Per EMS, pt could not catch breath at home around 0200 this morning. Per EMS, pt was recently diagnosed with a mass on her right side of lung and is receiving radiation for this. Pt is producing frothy white sputum.    Duoneb given by EMS with relief.

## 2018-04-10 ENCOUNTER — Telehealth: Payer: Self-pay | Admitting: *Deleted

## 2018-04-10 ENCOUNTER — Other Ambulatory Visit: Payer: Self-pay

## 2018-04-10 ENCOUNTER — Ambulatory Visit
Admission: RE | Admit: 2018-04-10 | Discharge: 2018-04-10 | Disposition: A | Discharge status: Home / Self Care | Payer: Medicaid Other | Source: Ambulatory Visit | Attending: Radiation Oncology | Admitting: Radiation Oncology

## 2018-04-10 DIAGNOSIS — D649 Anemia, unspecified: Secondary | ICD-10-CM

## 2018-04-10 DIAGNOSIS — I10 Essential (primary) hypertension: Secondary | ICD-10-CM

## 2018-04-10 DIAGNOSIS — C3491 Malignant neoplasm of unspecified part of right bronchus or lung: Principal | ICD-10-CM

## 2018-04-10 DIAGNOSIS — I871 Compression of vein: Secondary | ICD-10-CM

## 2018-04-10 DIAGNOSIS — R0603 Acute respiratory distress: Secondary | ICD-10-CM

## 2018-04-10 DIAGNOSIS — I82623 Acute embolism and thrombosis of deep veins of upper extremity, bilateral: Secondary | ICD-10-CM

## 2018-04-10 DIAGNOSIS — J9601 Acute respiratory failure with hypoxia: Secondary | ICD-10-CM

## 2018-04-10 DIAGNOSIS — J441 Chronic obstructive pulmonary disease with (acute) exacerbation: Secondary | ICD-10-CM

## 2018-04-10 DIAGNOSIS — D72829 Elevated white blood cell count, unspecified: Secondary | ICD-10-CM

## 2018-04-10 LAB — COMPREHENSIVE METABOLIC PANEL
ALK PHOS: 51 U/L (ref 38–126)
ALT: 11 U/L (ref 0–44)
ANION GAP: 8 (ref 5–15)
AST: 13 U/L — ABNORMAL LOW (ref 15–41)
Albumin: 2.5 g/dL — ABNORMAL LOW (ref 3.5–5.0)
BUN: 17 mg/dL (ref 8–23)
CHLORIDE: 96 mmol/L — AB (ref 98–111)
CO2: 28 mmol/L (ref 22–32)
Calcium: 9.2 mg/dL (ref 8.9–10.3)
Creatinine, Ser: 0.62 mg/dL (ref 0.44–1.00)
Glucose, Bld: 133 mg/dL — ABNORMAL HIGH (ref 70–99)
POTASSIUM: 4.4 mmol/L (ref 3.5–5.1)
SODIUM: 132 mmol/L — AB (ref 135–145)
Total Bilirubin: 0.3 mg/dL (ref 0.3–1.2)
Total Protein: 6.9 g/dL (ref 6.5–8.1)

## 2018-04-10 LAB — CBC
HEMATOCRIT: 22 % — AB (ref 36.0–46.0)
Hemoglobin: 7.1 g/dL — ABNORMAL LOW (ref 12.0–15.0)
MCH: 30.1 pg (ref 26.0–34.0)
MCHC: 32.3 g/dL (ref 30.0–36.0)
MCV: 93.2 fL (ref 80.0–100.0)
Platelets: 335 10*3/uL (ref 150–400)
RBC: 2.36 MIL/uL — ABNORMAL LOW (ref 3.87–5.11)
RDW: 15.3 % (ref 11.5–15.5)
WBC: 15.1 10*3/uL — AB (ref 4.0–10.5)
nRBC: 0 % (ref 0.0–0.2)

## 2018-04-10 LAB — GLUCOSE, CAPILLARY
GLUCOSE-CAPILLARY: 129 mg/dL — AB (ref 70–99)
GLUCOSE-CAPILLARY: 154 mg/dL — AB (ref 70–99)
Glucose-Capillary: 178 mg/dL — ABNORMAL HIGH (ref 70–99)
Glucose-Capillary: 142 mg/dL — ABNORMAL HIGH (ref 70–99)
Glucose-Capillary: 144 mg/dL — ABNORMAL HIGH (ref 70–99)

## 2018-04-10 LAB — ABO/RH: ABO/RH(D): B POS

## 2018-04-10 LAB — PREPARE RBC (CROSSMATCH)

## 2018-04-10 MED ORDER — SODIUM CHLORIDE 0.9% IV SOLUTION: Freq: Once | INTRAVENOUS | Status: AC

## 2018-04-10 MED ORDER — SODIUM CHLORIDE 0.9 % IV BOLUS
500.0000 mL | Freq: Once | INTRAVENOUS | Status: AC
  Administered 2018-04-10: 500 mL via INTRAVENOUS

## 2018-04-10 MED ORDER — DILTIAZEM HCL 25 MG/5ML IV SOLN
5.0000 mg | Freq: Once | INTRAVENOUS | Status: AC
  Administered 2018-04-10: 5 mg via INTRAVENOUS
  Filled 2018-04-10: qty 5

## 2018-04-10 MED ORDER — LEVALBUTEROL HCL 0.63 MG/3ML IN NEBU
0.6300 mg | INHALATION_SOLUTION | Freq: Four times a day (QID) | RESPIRATORY_TRACT | Status: DC
  Administered 2018-04-10 – 2018-04-11 (×4): 0.63 mg via RESPIRATORY_TRACT
  Filled 2018-04-10 (×4): qty 3

## 2018-04-10 MED ORDER — IPRATROPIUM BROMIDE 0.02 % IN SOLN
0.5000 mg | Freq: Four times a day (QID) | RESPIRATORY_TRACT | Status: DC
  Administered 2018-04-10 – 2018-04-11 (×4): 0.5 mg via RESPIRATORY_TRACT
  Filled 2018-04-10 (×4): qty 2.5

## 2018-04-10 MED ORDER — METOPROLOL TARTRATE 5 MG/5ML IV SOLN
5.0000 mg | Freq: Three times a day (TID) | INTRAVENOUS | Status: DC | PRN
  Administered 2018-04-13 – 2018-04-14 (×3): 5 mg via INTRAVENOUS
  Filled 2018-04-10 (×3): qty 5

## 2018-04-10 MED ORDER — METHYLPREDNISOLONE SODIUM SUCC 125 MG IJ SOLR
60.0000 mg | Freq: Two times a day (BID) | INTRAMUSCULAR | Status: DC
  Administered 2018-04-10 – 2018-04-12 (×5): 60 mg via INTRAVENOUS
  Filled 2018-04-10 (×5): qty 2

## 2018-04-10 NOTE — Evaluation (Signed)
Physical Therapy Evaluation Patient Details Name: Sonya [name] MRN: [id number] DOB: [birth year] Today's Date: 04/10/2018   History of Present Illness  Sonya [name] is a 62 y.o. 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   Clinical Impression  Patient presents with decreased independence with mobility due to weakness, limited activity tolerance and decreased balance.  She will benefit from skilled PT in the acute setting to allow return home with family support and to resume HHPT.    Follow Up Recommendations Home health PT;Supervision - Intermittent    Equipment Recommendations  None recommended by PT    Recommendations for Other Services       Precautions / Restrictions Precautions Precautions: Fall Precaution Comments: pt denies h/o falls      Mobility  Bed Mobility Overal bed mobility: Modified Independent                Transfers Overall transfer level: Needs assistance Equipment used: None Transfers: Sit to/from Stand Sit to Stand: Min guard         General transfer comment: assist for balance  Ambulation/Gait Ambulation/Gait assistance: Min guard Gait Distance (Feet): 2 Feet Assistive device: None Gait Pattern/deviations: Step-to pattern     General Gait Details: side stepped to [facility] only due to fatigue, awaiting transport to XRT later this pm   Stairs            Wheelchair Mobility    Modified Rankin (Stroke Patients Only)       Balance Overall balance assessment: Needs assistance   Sitting balance-Leahy Scale: Good       Standing balance-Leahy Scale: Fair                               Pertinent Vitals/Pain Pain Assessment: 0-10 Faces Pain Scale: Hurts little more Pain Location:  neck on R side Pain Descriptors / Indicators: Aching Pain Intervention(s): Monitored during session;Repositioned;Premedicated before session    [place] expects to be discharged to:: Private residence Living Arrangements: Other relatives(neice, sister, nephew) Available Help at Discharge: Family;Available 24 hours/day Type of Home: Apartment Home Access: Level entry     Home Layout: One level Home Equipment: Walker - 2 wheels;Shower seat;Grab bars - tub/shower Additional Comments: wants a hospital bed    Prior Function Level of Independence: Needs assistance   Gait / Transfers Assistance Needed: walker for ambulation  ADL's / Homemaking Assistance Needed: uses chair for shower        Hand Dominance        Extremity/Trunk Assessment   Upper Extremity Assessment Upper Extremity Assessment: Generalized weakness    Lower Extremity Assessment Lower Extremity Assessment: Generalized weakness    Cervical / Trunk Assessment Cervical / Trunk Assessment: Other exceptions Cervical / Trunk Exceptions: cachectic  Communication   Communication: No difficulties  Cognition                                              General Comments General comments (skin integrity, edema, etc.): SpO2 on 4L O2 94%, HR 122 at rest, 107 after standing in a-fib    Exercises     Assessment/Plan  PT Assessment Patient needs continued PT services  PT Problem List Decreased strength;Decreased mobility;Decreased safety awareness;Cardiopulmonary status limiting activity;Decreased balance;Decreased activity tolerance       PT Treatment Interventions DME instruction;Therapeutic activities;Gait training;Therapeutic exercise;Patient/family education;Balance training;Functional mobility training    PT Goals (Current goals can be found in the Care Plan section)  Acute Rehab PT Goals Patient Stated Goal: to return home PT Goal Formulation: With patient Time  For Goal Achievement: 04/24/18 Potential to Achieve Goals: Good    Frequency Min 3X/week   Barriers to discharge        Co-evaluation               AM-PAC PT "6 Clicks" Daily Activity  Outcome Measure Difficulty turning over in bed (including adjusting bedclothes, sheets and blankets)?: None Difficulty moving from lying on back to sitting on the side of the bed? : None Difficulty sitting down on and standing up from a chair with arms (e.g., wheelchair, bedside commode, etc,.)?: Unable Help needed moving to and from a bed to chair (including a wheelchair)?: A Little Help needed walking in hospital room?: A Little Help needed climbing 3-5 steps with a railing? : A Little 6 Click Score: 18    End of Session Equipment Utilized During Treatment: Oxygen Activity Tolerance: Patient limited by fatigue Patient left: in bed;with call bell/phone within reach   PT Visit Diagnosis: Muscle weakness (generalized) (M62.81);Other abnormalities of gait and mobility (R26.89)    Time: 0277-4128 PT Time Calculation (min) (ACUTE ONLY): 18 min   Charges:   PT Evaluation $PT Eval Low Complexity: [place], [place] Acute Rehabilitation Services [phone] 04/10/2018   Reginia Naas 04/10/2018, 3:26 PM

## 2018-04-10 NOTE — Telephone Encounter (Signed)
MA spoke with Nurse from [facility] and confirmed that PT did evaluate patient on 04/08/18 at 12:30pm. Patient was admitted to the hospital on 04/09/18. DME orders were received but patient will have to undergo a new referral once released from the hospital. MA called the niece Mecole and informed her of the status and that she will also receive a courtesy call from the nurse at [facility]. No further questions.

## 2018-04-10 NOTE — Progress Notes (Signed)
PROGRESS NOTE    Sonya [name]  ZCH:[id number] DOB: [birth year] DOA: 04/09/2018 PCP: Scot Jun, FNP  Brief Narrative:  HPI per Dr. Gerlean Ren on 04/09/18 Sonya [name] is a 62 y.o. 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  Patient states yesterday evening she acutely felt short of breath even at rest along with nonproductive coughing therefore came to the hospital for further evaluation.  Upon admission she was noted to be hypoxic saturating in low 80s on room air therefore had to be placed on 3 L nasal cannula which improved her saturation.  She was given multiple rounds of breathing treatment along with steroids.  She felt little better afterwards but still remained hypoxic on room air requiring supplemental oxygen.  Her labs were overall unremarkable.  She initially presented in July 2019 with back pain when she was diagnosed with large right upper lobe/suprahilar mass involving the bronchus, pulmonary artery and right SVC.  She was supposed to follow-up outpatient but does not appear she did and return back to the ER late October with nonproductive coughing, shortness of breath and weight loss.  At that time CTA showed increase in mass with vascular invasion.  CT of the abdomen pelvis was negative for any acute pathology.  MRI of the brain was also negative for metastases.  Had undergone CT-guided biopsy in E bus which confirmed squamous cell carcinoma of the right lung.  She was originally started on radiation treatment earlier this week.  **Was more dyspneic this AM so given a bolus of NS, 1 unit of pRBC's, and started on IV Solumedrol. Patient went back into A Fib with RVR and was given IV Lopressor. To go for Radiation Treatment again today.    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 Dr. Tammi Klippel involved  -Continue supportive care - I suspect she will likely end up requiring oxygen to go home on. -Continue Eliquis, pain control -DC duo nebs and start Xopenex/Atrovent every 6 hours scheduled given her atrial fibrillation  Essential Hypertension -C/w Cardizem 120 mg po Daily   Paroxysmal atrial fibrillation now with RVR -C/w Cardizem 120 mg orally daily.  Continue Eliquis -Placed on Telemetry -Given IV Lopressor 5 mg q8hprn for HR>130 -Continue to Monitor   Severe protein calorie malnutrition -Nutrition Consult -Encourage oral diet.  Ensure with her food.  Normocytic Anemia -Patient's hemoglobin/hematocrit dropped down to 7.1/22.0 -Type and screen and transfuse 1 unit PRBCs -Continue monitor for signs of bleeding- -repeat CBC in a.m.  Leukocytosis -Likely in the setting of her lung mass -  WBC went from 10 point is now 15.1 -Expect leukocytosis to elevate in the setting of IV steroid demargination -Chest x-ray showed necrotic mass -Do not feel the patient actually has an infection as she is afebrile -Continue monitor  and repeat CBC in a.m.  Hyponatremia -Mild at 132 -Given normal saline bolus -Continue monitor and repeat CMP in a.m.  DVT prophylaxis: Anticoagulated with Apixaban  Code Status: FULL CODE Family Communication: No Family present at bedside Disposition Plan: Remain Inpatient and D/C Home with Home Health when medically stable  Consultants:   Radiation Oncology  I will let Dr. Julien Nordmann of Medical Oncology know about patient's admission    Procedures: XRT   Antimicrobials:  Anti-infectives (From admission, onward)   None     Subjective: Bedside this morning and states she is feeling a bit better this morning.This morning states that she is extremely dyspneic after bleeding from the bed to chair.  No chest pain, lightheadedness or dizziness.  Has recently quit smoking.  No other concerns or complaints at this time and is ready for radiation therapy.  States that she feels her heart race whenever she exerts herself.  Objective: Vitals:   04/10/18 1130 04/10/18 1355 04/10/18 1429 04/10/18 1604  BP: 122/72 115/81  109/75  Pulse: (!) 119 98  100  Resp: (!) 22 (!) 24    Temp: 98.2 F (36.8 C) 97.6 F (36.4 C)  98.2 F (36.8 C)  TempSrc: Oral Oral  Oral  SpO2: 97% 98% 99% 98%  Weight:      Height:        Intake/Output Summary (Last 24 hours) at 04/10/2018 1748 Last data filed at 04/10/2018 1500 Gross per 24 hour  Intake 300 ml  Output -  Net 300 ml   Filed Weights   04/09/18 0642 04/10/18 0508  Weight: 45.8 kg 46.9 kg    Examination: Physical Exam:  Constitutional: Thin cachectic chronically ill appearing AAF in NAD and appears calm and  Eyes: Lids and conjunctivae normal, sclerae anicteric  ENMT: External Ears, Nose appear normal. Grossly normal hearing. Mucous membranes are moist.  Neck: Has a Right Neck minimal bulge; Neck appears supple Respiratory: Severely diminished on the Right compared to the left with some wheezing and coarse breath sounds; No  appreciable, rales, rhonchi or crackles. Normal respiratory effort and patient is not tachypenic. No accessory muscle use. Wearing supplemental O2 via [place] Cardiovascular: Irregularly Irregular and tachycardic, no murmurs / rubs / gallops. S1 and S2 auscultated. Trace extremity edema Abdomen: Soft, non-tender, non-distended. No masses palpated. No appreciable hepatosplenomegaly. Bowel sounds positive x4.  GU: Deferred. Musculoskeletal: No clubbing / cyanosis of digits/nails. Normal strength and muscle tone.  Skin: No rashes, lesions, ulcers on a limited skin evaluation. No induration; Warm and dry.  Neurologic: CN 2-12 grossly intact with no focal deficits. Romberg sign and cerebellar reflexes not assessed.  Psychiatric: Normal judgment and insight. Alert and oriented x 3. Slightly anxious mood and appropriate affect.   Data Reviewed: I have personally reviewed following labs and imaging studies  CBC: Recent Labs  Lab 04/09/18 0753 04/10/18 0635  WBC 10.8* 15.1*  NEUTROABS 9.5*  --   HGB 7.5* 7.1*  HCT 23.4* 22.0*  MCV 95.1 93.2  PLT 303 299   Basic Metabolic Panel: Recent Labs  Lab 04/09/18 0753 04/10/18 0635  NA 130* 132*  K 3.7 4.4  CL 96* 96*  CO2 26 28  GLUCOSE 142* 133*  BUN 10 17  CREATININE 0.58 0.62  CALCIUM 8.7* 9.2   GFR: Estimated Creatinine Clearance: 54 mL/min (by C-G formula based on SCr of 0.62 mg/dL). Liver Function Tests: Recent Labs  Lab 04/09/18 0753 04/10/18 0635  AST 11* 13*  ALT 10 11  ALKPHOS 56 51  BILITOT 0.4 0.3  PROT 7.0 6.9  ALBUMIN 2.5* 2.5*   No results for input(s): LIPASE, AMYLASE in the last 168 hours. No results for input(s): AMMONIA in the last 168 hours. Coagulation Profile: No results for input(s): INR, PROTIME in the last 168 hours. Cardiac Enzymes: Recent Labs  Lab 04/09/18 0753  TROPONINI <0.03   BNP (last 3 results) No results for input(s): PROBNP in the last 8760 hours. HbA1C: No results for input(s): HGBA1C in  the last 72 hours. CBG: Recent Labs  Lab 04/09/18 1818 04/09/18 2053 04/10/18 0738 04/10/18 1125 04/10/18 1611  GLUCAP 178* 178* 129* 142* 154*   Lipid Profile: No results for input(s): CHOL, HDL, LDLCALC, TRIG, CHOLHDL, LDLDIRECT in the last 72 hours. Thyroid Function Tests: No results for input(s): TSH, T4TOTAL, FREET4, T3FREE, THYROIDAB in the last 72 hours. Anemia Panel: No results for input(s): VITAMINB12, FOLATE, FERRITIN, TIBC, IRON, RETICCTPCT in the last 72 hours. Sepsis Labs: No results for input(s): PROCALCITON, LATICACIDVEN in the last 168 hours.  No results found for this or any previous visit (from the past 240 hour(s)).    Radiology Studies: Dg Chest 2 View  Result Date: 04/09/2018 CLINICAL DATA:  Shortness of breath and productive cough EXAM: CHEST - 2 VIEW COMPARISON:  03/18/2018 FINDINGS: Known bulky necrotic mass at the right apex. No acute opacity, edema, or effusion. Normal heart size. There is hyperinflation from COPD IMPRESSION: 1. Known necrotic mass at the right apex. The mass narrows the lower trachea. 2. No new abnormality. Electronically Signed   By: Monte Fantasia M.D.   On: 04/09/2018 07:37   Scheduled Meds: . apixaban  5 mg Oral BID  . budesonide (PULMICORT) nebulizer solution  0.5 mg Nebulization BID  . diltiazem  120 mg Oral Daily  . feeding supplement (ENSURE ENLIVE)  237 mL Oral BID BM  . insulin aspart  0-9 Units Subcutaneous TID WC  . ipratropium  0.5 mg Nebulization Q6H  . levalbuterol  0.63 mg Nebulization Q6H  . methylPREDNISolone (SOLU-MEDROL) injection  60 mg Intravenous Q12H   Continuous Infusions:   LOS: 1 day   Kerney Elbe, DO [facility] PAGER is on AMION  If 7PM-7AM, please contact night-coverage [email or web address] Password [facility] 04/10/2018, 5:48 PM

## 2018-04-11 ENCOUNTER — Inpatient Hospital Stay (HOSPITAL_COMMUNITY): Payer: Medicaid Other

## 2018-04-11 LAB — COMPREHENSIVE METABOLIC PANEL
ALBUMIN: 2.9 g/dL — AB (ref 3.5–5.0)
ALT: 13 U/L (ref 0–44)
ANION GAP: 9 (ref 5–15)
AST: 14 U/L — AB (ref 15–41)
Alkaline Phosphatase: 62 U/L (ref 38–126)
BILIRUBIN TOTAL: 0.4 mg/dL (ref 0.3–1.2)
BUN: 19 mg/dL (ref 8–23)
CALCIUM: 9.7 mg/dL (ref 8.9–10.3)
CO2: 27 mmol/L (ref 22–32)
CREATININE: 0.53 mg/dL (ref 0.44–1.00)
Chloride: 95 mmol/L — ABNORMAL LOW (ref 98–111)
GFR calc Af Amer: >60 mL/min (ref >60)
GFR calc non Af Amer: >60 mL/min (ref >60)
Glucose, Bld: 150 mg/dL — ABNORMAL HIGH (ref 70–99)
Potassium: 4.9 mmol/L (ref 3.5–5.1)
SODIUM: 131 mmol/L — AB (ref 135–145)
TOTAL PROTEIN: 7.5 g/dL (ref 6.5–8.1)

## 2018-04-11 LAB — GLUCOSE, CAPILLARY
GLUCOSE-CAPILLARY: 121 mg/dL — AB (ref 70–99)
Glucose-Capillary: 125 mg/dL — ABNORMAL HIGH (ref 70–99)
Glucose-Capillary: 97 mg/dL (ref 70–99)
Glucose-Capillary: 141 mg/dL — ABNORMAL HIGH (ref 70–99)

## 2018-04-11 LAB — BPAM RBC
BLOOD PRODUCT EXPIRATION DATE: 201912102359
ISSUE DATE / TIME: 201911221116
Unit Type and Rh: 7300

## 2018-04-11 LAB — CBC WITH DIFFERENTIAL/PLATELET
ABS IMMATURE GRANULOCYTES: 0.33 10*3/uL — AB (ref 0.00–0.07)
BASOS PCT: 0 %
Basophils Absolute: 0 10*3/uL (ref 0.0–0.1)
EOS ABS: 0 10*3/uL (ref 0.0–0.5)
Eosinophils Relative: 0 %
HCT: 31.3 % — ABNORMAL LOW (ref 36.0–46.0)
Hemoglobin: 10 g/dL — ABNORMAL LOW (ref 12.0–15.0)
Immature Granulocytes: 2 %
Lymphocytes Relative: 2 %
Lymphs Abs: 0.2 10*3/uL — ABNORMAL LOW (ref 0.7–4.0)
MCH: 29.3 pg (ref 26.0–34.0)
MCHC: 31.9 g/dL (ref 30.0–36.0)
MCV: 91.8 fL (ref 80.0–100.0)
Monocytes Absolute: 0.6 10*3/uL (ref 0.1–1.0)
Monocytes Relative: 4 %
NEUTROS ABS: 13.8 10*3/uL — AB (ref 1.7–7.7)
NEUTROS PCT: 92 %
PLATELETS: 376 10*3/uL (ref 150–400)
RBC: 3.41 MIL/uL — AB (ref 3.87–5.11)
RDW: 16.7 % — AB (ref 11.5–15.5)
WBC: 15 10*3/uL — AB (ref 4.0–10.5)
nRBC: 0 % (ref 0.0–0.2)

## 2018-04-11 LAB — TYPE AND SCREEN
ABO/RH(D): B POS
Antibody Screen: NEGATIVE
Unit division: 0

## 2018-04-11 LAB — PHOSPHORUS: Phosphorus: 2.7 mg/dL (ref 2.5–4.6)

## 2018-04-11 LAB — MAGNESIUM: Magnesium: 2.3 mg/dL (ref 1.7–2.4)

## 2018-04-11 MED ORDER — SODIUM CHLORIDE 0.9 % IV BOLUS
500.0000 mL | Freq: Once | INTRAVENOUS | Status: AC
  Administered 2018-04-11: 500 mL via INTRAVENOUS

## 2018-04-11 MED ORDER — IPRATROPIUM BROMIDE 0.02 % IN SOLN
0.5000 mg | Freq: Three times a day (TID) | RESPIRATORY_TRACT | Status: DC
  Administered 2018-04-11 – 2018-04-14 (×10): 0.5 mg via RESPIRATORY_TRACT
  Filled 2018-04-11 (×10): qty 2.5

## 2018-04-11 MED ORDER — GUAIFENESIN ER 600 MG PO TB12
600.0000 mg | ORAL_TABLET | Freq: Two times a day (BID) | ORAL | Status: DC | PRN
  Administered 2018-04-11 – 2018-04-14 (×4): 600 mg via ORAL
  Filled 2018-04-11 (×4): qty 1

## 2018-04-11 MED ORDER — DILTIAZEM HCL ER COATED BEADS 240 MG PO CP24
240.0000 mg | ORAL_CAPSULE | Freq: Every day | ORAL | Status: DC
  Administered 2018-04-12 – 2018-04-14 (×3): 240 mg via ORAL
  Filled 2018-04-11 (×3): qty 1

## 2018-04-11 MED ORDER — LEVALBUTEROL HCL 0.63 MG/3ML IN NEBU
0.6300 mg | INHALATION_SOLUTION | Freq: Three times a day (TID) | RESPIRATORY_TRACT | Status: DC
  Administered 2018-04-11 – 2018-04-14 (×10): 0.63 mg via RESPIRATORY_TRACT
  Filled 2018-04-11 (×10): qty 3

## 2018-04-11 MED ORDER — DILTIAZEM HCL ER COATED BEADS 120 MG PO CP24
120.0000 mg | ORAL_CAPSULE | Freq: Once | ORAL | Status: AC
  Administered 2018-04-11: 120 mg via ORAL
  Filled 2018-04-11: qty 1

## 2018-04-11 NOTE — Progress Notes (Signed)
PROGRESS NOTE    Sonya [name]  OHY:[id number] DOB: June 26, 1955 DOA: 04/09/2018 PCP: Scot Jun, FNP  Brief Narrative:  HPI per Dr. Gerlean Ren on 04/09/18 Sonya [name] is a 62 y.o. 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  Patient states yesterday evening she acutely felt short of breath even at rest along with nonproductive coughing therefore came to the hospital for further evaluation.  Upon admission she was noted to be hypoxic saturating in low 80s on room air therefore had to be placed on 3 L nasal cannula which improved her saturation.  She was given multiple rounds of breathing treatment along with steroids.  She felt little better afterwards but still remained hypoxic on room air requiring supplemental oxygen.  Her labs were overall unremarkable.  She initially presented in July 2019 with back pain when she was diagnosed with large right upper lobe/suprahilar mass involving the bronchus, pulmonary artery and right SVC.  She was supposed to follow-up outpatient but does not appear she did and return back to the ER late October with nonproductive coughing, shortness of breath and weight loss.  At that time CTA showed increase in mass with vascular invasion.  CT of the abdomen pelvis was negative for any acute pathology.  MRI of the brain was also negative for metastases.  Had undergone CT-guided biopsy in E bus which confirmed squamous cell carcinoma of the right lung.  She was originally started on radiation treatment earlier this week.  **Was more dyspneic yesterday AM so given a bolus of NS, 1 unit of pRBC's, and started on IV Solumedrol. Patient went back into A Fib with RVR and was given IV Lopressor and it improved rates but then went back up so  was given IV lopressor. Went for Radiation Treatment again yesterday .   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 Dr. Tammi Klippel involved  -Continue supportive care - I suspect she will likely end up requiring oxygen to go home on. -Continue Eliquis, pain control -DC duo nebs and start Xopenex/Atrovent every 6 hours scheduled given her atrial fibrillation -Patient states she was dyspenic early this AM but has improved somewhat -CXR this AM showed "Large mass in the right upper lung and apex with probable small area of central cavitation. There is narrowing of the distal tracheal air shadow. Left lung is clear and expanded. Emphysematous changes on the left. Heart size and pulmonary vascularity are normal." -Continue to Monitor Respiratory Status carefully   Essential Hypertension -C/w Cardizem 120 mg po Daily   Paroxysmal atrial  fibrillation now with RVR with Rates in the 120's -Cardizem 120 mg orally daily increased to 240 mg po Daily.  Continue Eliquis -Placed on Telemetry -Given IV Lopressor 5 mg q8hprn for HR>130 -Given Cardizem 5 mg Last night with improvement -If HR remain consistently  elevated will need Cardizem gtt -Continue to Monitor   Severe protein calorie malnutrition -Nutrition Consult -Encourage oral diet.  Ensure with her food.  Normocytic Anemia -Patient's hemoglobin/hematocrit dropped down to 7.1/22.0 -Type and screen and transfuse 1 unit PRBCs -Hb/Hct appropriately responded and is now 10.0/31.3 -Continue monitor for signs of bleeding- -repeat CBC in a.m.  Leukocytosis -Likely in the setting of her lung mass -WBC went from 10.8 -> 15.1 -> 15.0 -Expect leukocytosis to elevate in the setting of IV steroid demargination -Chest x-ray showed necrotic mass -Do not feel the patient actually has an infection as she is afebrile -Continue monitor and repeat CBC in a.m.  Hyponatremia -Mild at 131 -Given normal saline bolus yesterday and will give another bolus again today  -Continue monitor and repeat CMP in a.m.  DVT prophylaxis: Anticoagulated with Apixaban  Code Status: FULL CODE Family Communication: No Family present at bedside Disposition Plan: Remain Inpatient and D/C Home with Home Health when medically stable  Consultants:   Radiation Oncology  I will let Dr. Julien Nordmann of Medical Oncology know about patient's admission    Procedures: XRT   Antimicrobials:  Anti-infectives (From admission, onward)   None     Subjective: Seen and examined at bedside this morning states that she was felt a little bit better.  No chest pain but states that she does feel dyspneic occasionally especially when she sits up.  No nausea or vomiting.  No other concerns or complaints at this time feels like her breathing got slightly better.    Objective: Vitals:   04/11/18 0755 04/11/18 0814 04/11/18 1408 04/11/18 1430  BP:    (!) 148/95  Pulse:  71 (!) 111 (!) 122  Resp: 20 18 18 20   Temp:    97.8 F (36.6 C)  TempSrc:    Oral  SpO2:  96% 99% 100%  Weight:      Height:        Intake/Output Summary (Last 24 hours) at 04/11/2018 1538 Last data filed  at 04/11/2018 1300 Gross per 24 hour  Intake 480 ml  Output -  Net 480 ml   Filed Weights   04/09/18 0642 04/10/18 0508 04/11/18 0439  Weight: 45.8 kg 46.9 kg 46.4 kg    Examination: Physical Exam:  Constitutional: Thin and cachectic chronically ill-appearing African-American female currently no acute distress appears calm Eyes: Conjunctive are normal.  Sclera anicteric ENMT: External ears and nose appear normal.  Mucous members are moist Neck: Has a slight right neck bulge. Respiratory: Extremely diminished on the right compared to left with no appreciable wheezing but does have coarse breath sounds has a normal respiratory effort has no accessory muscle usage but is wearing supplemental oxygen via nasal cannula Cardiovascular: Regularly irregular and tachycardic.  Has no appreciable murmurs, rubs, gallops..  Has mild lower extremity edema Abdomen: Soft, nontender, nondistended.  Bowel sounds present in 4 quadrants GU: Deferred Musculoskeletal: No clubbing or cyanosis.  No joint deformities noted Skin: No appreciable rashes or lesions limited skin evaluation Neurologic: Cranial nerves II through XII grossly intact with no appreciable focal deficits.  Romberg sign and cerebellar reflexes were not assessed Psychiatric: Normal judgment and insight.  Patient is awake and alert and oriented  x3.  Eyes anxious today.  Data Reviewed: I have personally reviewed following labs and imaging studies  CBC: Recent Labs  Lab 04/09/18 0753 04/10/18 0635 04/11/18 0535  WBC 10.8* 15.1* 15.0*  NEUTROABS 9.5*  --  13.8*  HGB 7.5* 7.1* 10.0*  HCT 23.4* 22.0* 31.3*  MCV 95.1 93.2 91.8  PLT 303 335 510   Basic Metabolic Panel: Recent Labs  Lab 04/09/18 0753 04/10/18 0635 04/11/18 0535  NA 130* 132* 131*  K 3.7 4.4 4.9  CL 96* 96* 95*  CO2 26 28 27   GLUCOSE 142* 133* 150*  BUN 10 17 19   CREATININE 0.58 0.62 0.53  CALCIUM 8.7* 9.2 9.7  MG  --   --  2.3  PHOS  --   --  2.7    GFR: Estimated Creatinine Clearance: 53.4 mL/min (by C-G formula based on SCr of 0.53 mg/dL). Liver Function Tests: Recent Labs  Lab 04/09/18 0753 04/10/18 0635 04/11/18 0535  AST 11* 13* 14*  ALT 10 11 13   ALKPHOS 56 51 62  BILITOT 0.4 0.3 0.4  PROT 7.0 6.9 7.5  ALBUMIN 2.5* 2.5* 2.9*   No results for input(s): LIPASE, AMYLASE in the last 168 hours. No results for input(s): AMMONIA in the last 168 hours. Coagulation Profile: No results for input(s): INR, PROTIME in the last 168 hours. Cardiac Enzymes: Recent Labs  Lab 04/09/18 0753  TROPONINI <0.03   BNP (last 3 results) No results for input(s): PROBNP in the last 8760 hours. HbA1C: No results for input(s): HGBA1C in the last 72 hours. CBG: Recent Labs  Lab 04/10/18 1125 04/10/18 1611 04/10/18 2102 04/11/18 0736 04/11/18 1214  GLUCAP 142* 154* 144* 125* 97   Lipid Profile: No results for input(s): CHOL, HDL, LDLCALC, TRIG, CHOLHDL, LDLDIRECT in the last 72 hours. Thyroid Function Tests: No results for input(s): TSH, T4TOTAL, FREET4, T3FREE, THYROIDAB in the last 72 hours. Anemia Panel: No results for input(s): VITAMINB12, FOLATE, FERRITIN, TIBC, IRON, RETICCTPCT in the last 72 hours. Sepsis Labs: No results for input(s): PROCALCITON, LATICACIDVEN in the last 168 hours.  No results found for this or any previous visit (from the past 240 hour(s)).    Radiology Studies: Dg Chest Port 1 View  Result Date: 04/11/2018 CLINICAL DATA:  Shortness of breath EXAM: PORTABLE CHEST 1 VIEW COMPARISON:  04/09/2018 FINDINGS: Large mass in the right upper lung and apex with probable small area of central cavitation. There is narrowing of the distal tracheal air shadow. Left lung is clear and expanded. Emphysematous changes on the left. Heart size and pulmonary vascularity are normal. No significant change allowing for differences in technique. IMPRESSION: Large mass in the right upper lung with probable central cavitation. No  significant change. Electronically Signed   By: Lucienne Capers M.D.   On: 04/11/2018 06:08   Scheduled Meds: . apixaban  5 mg Oral BID  . budesonide (PULMICORT) nebulizer solution  0.5 mg Nebulization BID  . diltiazem  120 mg Oral Daily  . feeding supplement (ENSURE ENLIVE)  237 mL Oral BID BM  . insulin aspart  0-9 Units Subcutaneous TID WC  . ipratropium  0.5 mg Nebulization TID  . levalbuterol  0.63 mg Nebulization TID  . methylPREDNISolone (SOLU-MEDROL) injection  60 mg Intravenous Q12H   Continuous Infusions:   LOS: 2 days   Kerney Elbe, DO [facility] PAGER is on [address]  If 7PM-7AM, please contact night-coverage [email or web address] Password [facility] 04/11/2018, 3:38 PM

## 2018-04-11 NOTE — Progress Notes (Signed)
Heart rate on tele monitor noted to be as high as 140's, irregular on monitor and upon auscultation. EKG obtained. Pt noted to be in a-fib with RVR. MD notified.Cardizem given, HR maintained 80's-120. Will continue to closely monitor at this time. Tele has been instructed to call if heart rate gets to 130.

## 2018-04-11 NOTE — Progress Notes (Signed)
Pt states that she feels like she cannot breathe, diminished upon auscultation. Vitals WNL. Currently at bedside administering breathing treatment. Will continue to monitor at this time.

## 2018-04-12 ENCOUNTER — Ambulatory Visit
Admission: RE | Admit: 2018-04-12 | Discharge: 2018-04-12 | Disposition: A | Discharge status: Home / Self Care | Payer: Medicaid Other | Source: Ambulatory Visit | Attending: Radiation Oncology | Admitting: Radiation Oncology

## 2018-04-12 ENCOUNTER — Inpatient Hospital Stay (HOSPITAL_COMMUNITY): Payer: Medicaid Other

## 2018-04-12 LAB — CBC WITH DIFFERENTIAL/PLATELET
Abs Immature Granulocytes: 0.27 10*3/uL — ABNORMAL HIGH (ref 0.00–0.07)
BASOS PCT: 0 %
Basophils Absolute: 0.1 10*3/uL (ref 0.0–0.1)
EOS ABS: 0 10*3/uL (ref 0.0–0.5)
Eosinophils Relative: 0 %
HEMATOCRIT: 31.2 % — AB (ref 36.0–46.0)
Hemoglobin: 10.1 g/dL — ABNORMAL LOW (ref 12.0–15.0)
Immature Granulocytes: 2 %
Lymphocytes Relative: 2 %
Lymphs Abs: 0.2 10*3/uL — ABNORMAL LOW (ref 0.7–4.0)
MCH: 30.1 pg (ref 26.0–34.0)
MCHC: 32.4 g/dL (ref 30.0–36.0)
MCV: 93.1 fL (ref 80.0–100.0)
MONO ABS: 0.6 10*3/uL (ref 0.1–1.0)
MONOS PCT: 4 %
Neutro Abs: 12.8 10*3/uL — ABNORMAL HIGH (ref 1.7–7.7)
Neutrophils Relative %: 92 %
Platelets: 380 10*3/uL (ref 150–400)
RBC: 3.35 MIL/uL — AB (ref 3.87–5.11)
RDW: 16.2 % — AB (ref 11.5–15.5)
WBC: 14 10*3/uL — AB (ref 4.0–10.5)
nRBC: 0 % (ref 0.0–0.2)

## 2018-04-12 LAB — PHOSPHORUS: PHOSPHORUS: 2.8 mg/dL (ref 2.5–4.6)

## 2018-04-12 LAB — GLUCOSE, CAPILLARY
Glucose-Capillary: 132 mg/dL — ABNORMAL HIGH (ref 70–99)
Glucose-Capillary: 132 mg/dL — ABNORMAL HIGH (ref 70–99)
Glucose-Capillary: 143 mg/dL — ABNORMAL HIGH (ref 70–99)
Glucose-Capillary: 131 mg/dL — ABNORMAL HIGH (ref 70–99)

## 2018-04-12 LAB — COMPREHENSIVE METABOLIC PANEL
ALT: 22 U/L (ref 0–44)
ANION GAP: 8 (ref 5–15)
AST: 25 U/L (ref 15–41)
Albumin: 2.7 g/dL — ABNORMAL LOW (ref 3.5–5.0)
Alkaline Phosphatase: 61 U/L (ref 38–126)
BILIRUBIN TOTAL: 0.4 mg/dL (ref 0.3–1.2)
BUN: 19 mg/dL (ref 8–23)
CO2: 28 mmol/L (ref 22–32)
Calcium: 9.4 mg/dL (ref 8.9–10.3)
Chloride: 98 mmol/L (ref 98–111)
Creatinine, Ser: 0.51 mg/dL (ref 0.44–1.00)
GFR calc Af Amer: >60 mL/min (ref >60)
Glucose, Bld: 130 mg/dL — ABNORMAL HIGH (ref 70–99)
POTASSIUM: 4.9 mmol/L (ref 3.5–5.1)
Sodium: 134 mmol/L — ABNORMAL LOW (ref 135–145)
Total Protein: 7.1 g/dL (ref 6.5–8.1)

## 2018-04-12 LAB — MAGNESIUM: MAGNESIUM: 2.2 mg/dL (ref 1.7–2.4)

## 2018-04-12 MED ORDER — METHYLPREDNISOLONE SODIUM SUCC 125 MG IJ SOLR: 60.0000 mg | Freq: Every day | INTRAMUSCULAR | Status: DC

## 2018-04-12 NOTE — Progress Notes (Signed)
PROGRESS NOTE    Sonya [name]  LOV:[id number] DOB: July 12, 1955 DOA: 04/09/2018 PCP: Scot Jun, FNP   Brief Narrative:  HPI on 04/09/2018 by Dr. Gerlean Ren Sonya [name] is a 62 y.o. 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  Patient states yesterday evening she acutely felt short of breath even at rest along with nonproductive coughing therefore came to the hospital for further evaluation.  Upon admission she was noted to be hypoxic saturating in low 80s on room air therefore had to be placed on 3 L nasal cannula which improved her saturation.  She was given multiple rounds of breathing treatment along with steroids.  She felt little better afterwards but still remained hypoxic on room air requiring supplemental oxygen.  Her labs were overall unremarkable.  She initially presented in July 2019 with back pain when she was diagnosed with large right upper lobe/suprahilar mass involving the bronchus, pulmonary artery and right SVC.  She was supposed to follow-up outpatient but does not appear she did and return back to the ER late October with nonproductive coughing, shortness of breath and weight loss.  At that time CTA showed increase in mass with vascular invasion.  CT of the abdomen pelvis was negative for any acute pathology.  MRI of the brain was also negative for metastases.  Had undergone CT-guided biopsy in [place] which confirmed squamous cell carcinoma of the right lung.  She was originally started on radiation treatment earlier this week.  Social history-quit smoking about a month ago, denies any alcohol or illicit drug use.  Interim history Admitted for acute respiratory distress with hypoxia, found to have worsening squamous cell carcinoma of the  right lung with SVC syndrome and compression of the trachea due to lung mass.  Currently on supplemental oxygen and receiving radiation therapy treatments. Assessment & Plan   Acute respiratory distress with hypoxia/squamous cell carcinoma of the right lung/SVC syndrome with compression of the trachea due to lung mass -Chest x-ray showed large mass in the right upper lung and apex with probable small area of central cavitation.  Narrowing of the distal trachea air shadow.  Left lung is clear and expanded.  Emphysematous changes on the left. -Continue supplemental oxygen to maintain saturations above 90%, bronchodilator treatment, Pulmicort -Continue Solu-Medrol, will wean -Radiation oncology consulted and appreciated, currently receiving radiation treatments  Essential hypertension -Continue Cardizem  Paroxysmal atrial fibrillation with RVR -Currently heart rate has improved -Continue Cardizem, dose was increased to 240 mg daily -Continue Eliquis  Severe protein calorie malnutrition -Nutrition consulted, continue nutritional supplements  Normocytic anemia -Hemoglobin dropped to 7.1, patient was transfused 1 unit PRBC -Hemoglobin currently 10.1 (appears stable) -Continue to monitor CBC  Leukocytosis -Chest x-ray showed necrotic mass.  Patient also on IV steroids, therefore WBC may be active. -Patient currently afebrile -WBC down to 14 today  Hyponatremia -Improving, currently 134 -continue to monitor BMP  DVT Prophylaxis Eliquis  Code Status: Full  Family Communication: None at bedside  Disposition Plan: Admitted.  Suspect home with home health when medically stable and further recommendations from radiation oncology.  Consultants Radiation oncology Oncology, Dr. Earlie Server made aware of patient's admission  Procedures  XRT  Antibiotics   Anti-infectives (From admission, onward)   None      Subjective:   Sonya [name] seen  and examined today.  He is to  have shortness of breath intermittently with cough.  Currently feeling mildly better as compared to previous days.  Denies current chest pain, abdominal pain, nausea or vomiting, dizziness or headache.  States that she would need to get things like a hospital bed at home before leaving the hospital.  Objective:   Vitals:   04/11/18 2036 04/12/18 0434 04/12/18 0544 04/12/18 0921  BP:  (!) 174/76 140/76 (!) 162/82  Pulse:  80    Resp:  18    Temp:  98.3 F (36.8 C)    TempSrc:  Oral    SpO2: 92% 97%    Weight:  49.8 kg    Height:        Intake/Output Summary (Last 24 hours) at 04/12/2018 1023 Last data filed at 04/12/2018 0800 Gross per 24 hour  Intake 800.13 ml  Output -  Net 800.13 ml   Filed Weights   04/10/18 0508 04/11/18 0439 04/12/18 0434  Weight: 46.9 kg 46.4 kg 49.8 kg    Exam  General: Well developed, chronically ill-appearing, NAD  HEENT: NCAT, mucous membranes moist.   Neck: Supple  Cardiovascular: S1 S2 auscultated, irregular, no murmur  Respiratory: Diminished breath sounds on the right, coarse breath sounds  Abdomen: Soft, nontender, nondistended, + bowel sounds  Extremities: warm dry without cyanosis clubbing or edema  Neuro: AAOx3, nonfocal  Psych: appropriate mood and affect, pleasant   Data Reviewed: I have personally reviewed following labs and imaging studies  CBC: Recent Labs  Lab 04/09/18 0753 04/10/18 0635 04/11/18 0535 04/12/18 0550  WBC 10.8* 15.1* 15.0* 14.0*  NEUTROABS 9.5*  --  13.8* 12.8*  HGB 7.5* 7.1* 10.0* 10.1*  HCT 23.4* 22.0* 31.3* 31.2*  MCV 95.1 93.2 91.8 93.1  PLT 303 335 376 270   Basic Metabolic Panel: Recent Labs  Lab 04/09/18 0753 04/10/18 0635 04/11/18 0535 04/12/18 0550  NA 130* 132* 131* 134*  K 3.7 4.4 4.9 4.9  CL 96* 96* 95* 98  CO2 26 28 27 28   GLUCOSE 142* 133* 150* 130*  BUN 10 17 19 19   CREATININE 0.58 0.62 0.53 0.51  CALCIUM 8.7* 9.2 9.7 9.4  MG  --   --  2.3 2.2  PHOS  --   --  2.7  2.8   GFR: Estimated Creatinine Clearance: 57.3 mL/min (by C-G formula based on SCr of 0.51 mg/dL). Liver Function Tests: Recent Labs  Lab 04/09/18 0753 04/10/18 0635 04/11/18 0535 04/12/18 0550  AST 11* 13* 14* 25  ALT 10 11 13 22   ALKPHOS 56 51 62 61  BILITOT 0.4 0.3 0.4 0.4  PROT 7.0 6.9 7.5 7.1  ALBUMIN 2.5* 2.5* 2.9* 2.7*   No results for input(s): LIPASE, AMYLASE in the last 168 hours. No results for input(s): AMMONIA in the last 168 hours. Coagulation Profile: No results for input(s): INR, PROTIME in the last 168 hours. Cardiac Enzymes: Recent Labs  Lab 04/09/18 0753  TROPONINI <0.03   BNP (last 3 results) No results for input(s): PROBNP in the last 8760 hours. HbA1C: No results for input(s): HGBA1C in the last 72 hours. CBG: Recent Labs  Lab 04/11/18 0736 04/11/18 1214 04/11/18 1708 04/11/18 2016 04/12/18 0724  GLUCAP 125* 97 121* 141* 132*   Lipid Profile: No results for input(s): CHOL, HDL, LDLCALC, TRIG, CHOLHDL, LDLDIRECT in the last 72 hours. Thyroid Function Tests: No results for input(s): TSH, T4TOTAL, FREET4, T3FREE, THYROIDAB in the last 72 hours. Anemia Panel: No  results for input(s): VITAMINB12, FOLATE, FERRITIN, TIBC, IRON, RETICCTPCT in the last 72 hours. Urine analysis: No results found for: COLORURINE, APPEARANCEUR, LABSPEC, PHURINE, GLUCOSEU, HGBUR, BILIRUBINUR, KETONESUR, PROTEINUR, UROBILINOGEN, NITRITE, LEUKOCYTESUR Sepsis Labs: @LABRCNTIP (procalcitonin:4,lacticidven:4)  )No results found for this or any previous visit (from the past 240 hour(s)).    Radiology Studies: Dg Chest Port 1 View  Result Date: 04/12/2018 CLINICAL DATA:  Shortness of breath EXAM: PORTABLE CHEST 1 VIEW COMPARISON:  04/11/2018 FINDINGS: No change since yesterday's study. Emphysematous changes in the lungs. Large right upper lung mass. Heart size is normal. IMPRESSION: No change since yesterday's study. Electronically Signed   By: Lucienne Capers M.D.   On:  04/12/2018 06:29   Dg Chest Port 1 View  Result Date: 04/11/2018 CLINICAL DATA:  Shortness of breath EXAM: PORTABLE CHEST 1 VIEW COMPARISON:  04/09/2018 FINDINGS: Large mass in the right upper lung and apex with probable small area of central cavitation. There is narrowing of the distal tracheal air shadow. Left lung is clear and expanded. Emphysematous changes on the left. Heart size and pulmonary vascularity are normal. No significant change allowing for differences in technique. IMPRESSION: Large mass in the right upper lung with probable central cavitation. No significant change. Electronically Signed   By: Lucienne Capers M.D.   On: 04/11/2018 06:08     Scheduled Meds: . apixaban  5 mg Oral BID  . budesonide (PULMICORT) nebulizer solution  0.5 mg Nebulization BID  . diltiazem  240 mg Oral Daily  . feeding supplement (ENSURE ENLIVE)  237 mL Oral BID BM  . insulin aspart  0-9 Units Subcutaneous TID WC  . ipratropium  0.5 mg Nebulization TID  . levalbuterol  0.63 mg Nebulization TID  . methylPREDNISolone (SOLU-MEDROL) injection  60 mg Intravenous Q12H   Continuous Infusions:   LOS: 3 days   Time Spent in minutes   30 minutes  Sonya [name] D.O. on 04/12/2018 at 10:23 AM  Between 7am to 7pm - Please see pager noted on [email or web address]  After 7pm go to [email or web address]  And look for the night coverage person covering for me after hours  [facility] Office  [phone]

## 2018-04-12 NOTE — Progress Notes (Signed)
[facility] Therapy Treatment Record Phone [phone]   Radiation Therapy was administered to Jocelyn Lamer on: 04/12/2018  8:54 AM and was treatment # 6 out of a planned course of 33 treatments.  Radiation Treatment  1). Beam photons with 6-10 energy  2). Brachytherapy None  3). Stereotactic Radiosurgery None  4). Other Radiation None     Tion Tse J Aniella Wandrey, RT

## 2018-04-13 ENCOUNTER — Inpatient Hospital Stay (HOSPITAL_COMMUNITY): Payer: Medicaid Other

## 2018-04-13 ENCOUNTER — Ambulatory Visit
Admission: RE | Admit: 2018-04-13 | Discharge: 2018-04-13 | Disposition: A | Discharge status: Home / Self Care | Payer: Medicaid Other | Source: Ambulatory Visit | Attending: Radiation Oncology | Admitting: Radiation Oncology

## 2018-04-13 LAB — BASIC METABOLIC PANEL
ANION GAP: 8 (ref 5–15)
BUN: 20 mg/dL (ref 8–23)
CHLORIDE: 94 mmol/L — AB (ref 98–111)
CO2: 31 mmol/L (ref 22–32)
Calcium: 9.8 mg/dL (ref 8.9–10.3)
Creatinine, Ser: 0.56 mg/dL (ref 0.44–1.00)
GFR calc non Af Amer: >60 mL/min (ref >60)
GLUCOSE: 102 mg/dL — AB (ref 70–99)
Potassium: 4.8 mmol/L (ref 3.5–5.1)
Sodium: 133 mmol/L — ABNORMAL LOW (ref 135–145)

## 2018-04-13 LAB — CBC
HEMATOCRIT: 34.8 % — AB (ref 36.0–46.0)
HEMOGLOBIN: 10.8 g/dL — AB (ref 12.0–15.0)
MCH: 29 pg (ref 26.0–34.0)
MCHC: 31 g/dL (ref 30.0–36.0)
MCV: 93.5 fL (ref 80.0–100.0)
NRBC: 0 % (ref 0.0–0.2)
Platelets: 384 10*3/uL (ref 150–400)
RBC: 3.72 MIL/uL — ABNORMAL LOW (ref 3.87–5.11)
RDW: 15.9 % — ABNORMAL HIGH (ref 11.5–15.5)
WBC: 19.2 10*3/uL — AB (ref 4.0–10.5)

## 2018-04-13 LAB — GLUCOSE, CAPILLARY
GLUCOSE-CAPILLARY: 123 mg/dL — AB (ref 70–99)
GLUCOSE-CAPILLARY: 120 mg/dL — AB (ref 70–99)
Glucose-Capillary: 94 mg/dL (ref 70–99)
Glucose-Capillary: 116 mg/dL — ABNORMAL HIGH (ref 70–99)

## 2018-04-13 LAB — DIFFERENTIAL
BASOS ABS: 0.1 10*3/uL (ref 0.0–0.1)
BASOS PCT: 0 %
EOS ABS: 0 10*3/uL (ref 0.0–0.5)
Eosinophils Relative: 0 %
LYMPHS ABS: 0.3 10*3/uL — AB (ref 0.7–4.0)
Lymphocytes Relative: 2 %
Monocytes Absolute: 1.4 10*3/uL — ABNORMAL HIGH (ref 0.1–1.0)
Monocytes Relative: 7 %
NEUTROS PCT: 89 %
Neutro Abs: 16.8 10*3/uL — ABNORMAL HIGH (ref 1.7–7.7)

## 2018-04-13 MED ORDER — PREDNISONE 50 MG PO TABS
60.0000 mg | ORAL_TABLET | Freq: Every day | ORAL | Status: DC
  Administered 2018-04-13 – 2018-04-14 (×2): 60 mg via ORAL
  Filled 2018-04-13 (×2): qty 1

## 2018-04-13 MED ORDER — LEVOFLOXACIN 500 MG PO TABS: 500.0000 mg | ORAL_TABLET | Freq: Every day | ORAL | 0 refills | Status: AC

## 2018-04-13 MED ORDER — ONDANSETRON HCL 4 MG PO TABS: 4.0000 mg | ORAL_TABLET | Freq: Four times a day (QID) | ORAL | 0 refills | Status: AC | PRN

## 2018-04-13 MED ORDER — PREDNISONE 20 MG PO TABS: ORAL_TABLET | ORAL | 0 refills | Status: AC

## 2018-04-13 MED ORDER — DILTIAZEM HCL ER COATED BEADS 240 MG PO CP24: 240.0000 mg | ORAL_CAPSULE | Freq: Every day | ORAL | 0 refills | Status: AC

## 2018-04-13 MED ORDER — ALUM & MAG HYDROXIDE-SIMETH 200-200-20 MG/5ML PO SUSP
30.0000 mL | Freq: Four times a day (QID) | ORAL | Status: DC | PRN
  Administered 2018-04-13: 30 mL via ORAL
  Filled 2018-04-13: qty 30

## 2018-04-13 NOTE — Care Management Note (Addendum)
Case Management Note  Patient Details  Name: Sonya [name] MRN: [id number] Date of Birth: 1955-10-05  Subjective/Objective:                  hhc orders Need for transport once dme is delivered.  Med. Nec. Form completed and folder left with RN. Action/Plan: Dr. Jessee Avers notified by text of need for hhc orders.  [facility] is aware.  Expected Discharge Date:  04/13/18               Expected Discharge Plan:  [place]  In-House Referral:  NA  Discharge planning Services  CM Consult  Post Acute Care Choice:  Home Health Choice offered to:     DME Arranged:    DME Agency:     HH Arranged:  PT, OT, Social Work [employer] Agency:  [place]  Status of Service:  In process, will continue to follow  If discussed at Long Length of Stay Meetings, dates discussed:    Additional Comments:  Leeroy Cha, RN 04/13/2018, 2:12 PM

## 2018-04-13 NOTE — Progress Notes (Signed)
Physical Therapy Treatment Patient Details Name: Sonya [name] MRN: [id number] DOB: [birth year] Today's Date: 04/13/2018    History of Present Illness Sonya [name] is a 62 y.o. 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     PT Comments    Pt in bed on 2.5 lts returned from Radiation and already "tired" but needed to void.  Assisted OOB to [facility] pt present with 4/4 dyspnea, increased RR and HR increased to 150's.  Trial RA sats decreased to 84%.  Returned to 2.5 lts to achieve sats > 90 but required an extended rest break approx 10 min to recover.  RN notified.  Assisted off BSC, assisted with peri care, then to recliner.  Again, HR elevated and RR with 4/4 dyspnea.  Pt unable to tolerate any further activity.  Another 12 min recovery required with moist coughing and 2/4 dyspnea at rest.  Pt unable to attempt ambulation.  SATURATION QUALIFICATIONS: (This note is used to comply with regulatory documentation for home oxygen)  Patient Saturations on Room Air at Rest = 89%  Patient Saturations on Room Air while transferring (unable to amb) = 84% HR increased to 150"s  Patient Saturations on 2.5 Liters of oxygen while transferring (unable to amb)  = 92% Patient Saturations on 2.5 lts at rest 96% Please briefly explain why patient needs home oxygen:  Pt required supplemental oxygen to achieve therapeutic levels    Follow Up Recommendations  Home health PT;Supervision/Assistance - 24 hour(pt declines SNF and states she has multiple family members who can help)     Equipment Recommendations  None recommended by PT;Other (comment)(oxygen)    Recommendations for Other Services       Precautions / Restrictions Precautions Precautions: Fall Precaution  Comments: monitor sats Restrictions Weight Bearing Restrictions: No    Mobility  Bed Mobility Overal bed mobility: Needs Assistance Bed Mobility: Supine to Sit     Supine to sit: Supervision;Min guard     General bed mobility comments: assist with blankets and O2 tubing awareness  Transfers Overall transfer level: Needs assistance Equipment used: None Transfers: Stand Pivot Transfers Sit to Stand: Min guard;Supervision Stand pivot transfers: Supervision;Min guard       General transfer comment: assist for balance and safety with turns  Ambulation/Gait             General Gait Details: unable to amb due to 4/4 dyspnea with transfers   Stairs             Wheelchair Mobility    Modified Rankin (Stroke Patients Only)       Balance                                            Cognition Arousal/Alertness: Awake/alert Behavior During Therapy: WFL for tasks assessed/performed Overall Cognitive Status: Within Functional Limits for tasks assessed                                        Exercises      General Comments        Pertinent Vitals/Pain Pain Assessment: No/denies pain  Home Living                      Prior Function            PT Goals (current goals can now be found in the care plan section) Progress towards PT goals: Progressing toward goals    Frequency    Min 3X/week      PT Plan Discharge plan needs to be updated    Co-evaluation              AM-PAC PT "6 Clicks" Mobility   Outcome Measure  Help needed turning from your back to your side while in a flat bed without using bedrails?: Total Help needed moving from lying on your back to sitting on the side of a flat bed without using bedrails?: Total Help needed moving to and from a bed to a chair (including a wheelchair)?: Total Help needed standing up from a chair using your arms (e.g., wheelchair or bedside chair)?:  Total Help needed to walk in hospital room?: Total Help needed climbing 3-5 steps with a railing? : Total 6 Click Score: 6    End of Session Equipment Utilized During Treatment: Oxygen Activity Tolerance: Treatment limited secondary to medical complications (Comment) Patient left: in chair;with call bell/phone within reach Nurse Communication: Mobility status(severe dyspnea and increased HR with transfers) PT Visit Diagnosis: Muscle weakness (generalized) (M62.81);Other abnormalities of gait and mobility (R26.89)     Time: 4650-3546 PT Time Calculation (min) (ACUTE ONLY): 25 min  Charges:  $Therapeutic Activity: 23-37 mins                     Rica Koyanagi  PTA Acute  Rehabilitation Services Pager      [phone] Office      [phone]

## 2018-04-13 NOTE — Discharge Summary (Signed)
Physician Discharge Summary  Sonya [name] BHA:[id number] DOB: 10-15-1955 DOA: 04/09/2018  PCP: Scot Jun, FNP  Admit date: 04/09/2018 Discharge date: 04/13/2018  Time spent: 45 minutes  Recommendations for Outpatient Follow-up:  Patient will be discharged to home.  Patient will need to follow up with primary care provider within one week of discharge, repeat CBC.  Follow up with oncology, Dr. Julien Nordmann. Continue radiation therapy. Patient should continue medications as prescribed.  Patient should follow a regular diet.   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 Patient states yesterday evening she acutely felt short of breath even at rest along with nonproductive coughing therefore came to the hospital for further evaluation. Upon admission she was noted to be hypoxic saturating in low 80s on room air therefore had to be placed on 3 L nasal cannula which improved her saturation. She was given multiple rounds of breathing treatment along with steroids. She feltlittle better afterwards but still remained hypoxic on room air requiring supplemental oxygen. Her labs were overall  unremarkable.  She initially presented in July 2019 with back pain when she was diagnosed with large right upper lobe/suprahilar mass involving the bronchus, pulmonary artery and right SVC. She was supposed to follow-up outpatient but does not appear she did and return back to the ER late October with nonproductive coughing, shortness of breath and weight loss. At that time CTA showed increase in mass with vascular invasion. CT of the abdomen pelvis was negative for any acute pathology. MRI of the brain was also negative for metastases. Had undergone CT-guided biopsy in [place] which confirmed squamous cell carcinoma of the right lung. She was originally started on radiation treatment earlier this week.  Social history-quit smoking about a month ago, denies any alcohol or illicit drug use.  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  Narrowing of the distal trachea air shadow.  Left lung is clear and expanded.  Emphysematous changes on the left. -Continue supplemental oxygen to maintain saturations above 90%, bronchodilator treatment, Pulmicort -placed on solumedrol, will discharge with prednisone taper -Radiation oncology consulted and appreciated, currently receiving radiation treatments  Essential hypertension -Continue Cardizem  Paroxysmal atrial fibrillation with RVR -Currently heart rate has improved -Continue Cardizem, dose was increased to 240 mg daily -Continue Eliquis  Severe protein calorie malnutrition -Nutrition consulted, continue nutritional supplements  Normocytic anemia -Hemoglobin dropped to 7.1, patient was transfused 1 unit PRBC -Hemoglobin currently 10.8 (appears stable) -Continue to monitor CBC  Leukocytosis -Chest x-ray showed  necrotic mass.  Patient also on IV steroids, therefore WBC may be reactive. -Patient currently afebrile -repeat CXR today unchanged from previous  days- R lung consolidation -Discussed with Dr. Julien Nordmann, recommended 5 days of levaquin to cover for possible post-obstructive pneumonia   Hyponatremia -Improving, currently 133 -repeat BMP in one week  Consultants Radiation oncology Oncology, Dr. Earlie Server made aware of patient's admission  Procedures  XRT  Discharge Exam: Vitals:   04/13/18 0559 04/13/18 0731  BP: (!) 166/81   Pulse: 97   Resp: 19   Temp: 97.7 F (36.5 C)   SpO2: 100% 98%   Patient states she is feeling better today. Denies current chest pain, abdominal pain, nausea, vomiting, diarrhea, constipation. Has some indigestion. Feels breathing has improved, but continues to have shortness of breath with minimal exertion.     General: Well developed, chronically ill appearing, NAD  HEENT: NCAT, mucous membranes moist.  Cardiovascular: S1 S2 auscultated, irregular  Respiratory: diminished breath sounds, R>L  Extremities: warm dry without cyanosis clubbing or edema  Neuro: AAOx3, nonfocal  Psych: Pleasant, appropriate mood and affect  Discharge Instructions Discharge Instructions    Discharge instructions   Complete by:  As directed    Patient will be discharged to home.  Patient will need to follow up with primary care provider within one week of discharge, repeat CBC.  Follow up with oncology, Dr. Julien Nordmann. Continue radiation therapy. Patient should continue medications as prescribed.  Patient should follow a regular diet.     Allergies as of 04/13/2018   No Known Allergies     Medication List    STOP taking these medications   hydrALAZINE 20 MG/ML injection Commonly known as:  APRESOLINE     TAKE these medications   albuterol 108 (90 Base) MCG/ACT inhaler Commonly known as:  PROVENTIL HFA;VENTOLIN HFA Inhale 2 puffs into the lungs every 6 (six)  hours as needed for wheezing or shortness of breath.   albuterol (2.5 MG/3ML) 0.083% nebulizer solution Commonly known as:  PROVENTIL Take 3 mLs (2.5 mg total) by nebulization every 6 (six) hours as needed for wheezing or shortness of breath.   apixaban 5 MG Tabs tablet Commonly known as:  ELIQUIS Take 1 tablet (5 mg total) by mouth 2 (two) times daily. What changed:  Another medication with the same name was removed. Continue taking this medication, and follow the directions you see here.   diltiazem 240 MG 24 hr capsule Commonly known as:  CARDIZEM CD Take 1 capsule (240 mg total) by mouth daily. Start taking on:  04/14/2018 What changed:    medication strength  how much to take   feeding supplement (ENSURE ENLIVE) Liqd Take 237 mLs by mouth 2 (two) times daily between meals.   ipratropium 0.02 % nebulizer solution Commonly known as:  ATROVENT Take 2.5 mLs (0.5 mg total) by nebulization 4 (four) times daily.   levofloxacin 500 MG tablet Commonly known as:  LEVAQUIN Take 1 tablet (500 mg total) by mouth daily for 5 days.   ondansetron 4 MG tablet Commonly known as:  ZOFRAN Take 1 tablet (4 mg total) by mouth every 6 (six) hours as needed for nausea.   oxyCODONE-acetaminophen 5-325 MG tablet Commonly known as:  PERCOCET/ROXICET Take 1 tablet by mouth every 8 (eight) hours as needed for severe pain. What changed:  how much to take   predniSONE 20 MG tablet Commonly known as:  DELTASONE Take in the morning. Take 3 tabs x 3 days, then 2 tabs x 3 days, then 1 tab x 3 days.            Durable Medical Equipment  (  From admission, onward)         Start     Ordered   04/13/18 1030  For home use only DME Nebulizer/meds  Once    Question:  Patient needs a nebulizer to treat with the following condition  Answer:  SOB (shortness of breath)   04/13/18 1029   04/13/18 1029  For home use only DME Nebulizer machine  Once    Question:  Patient needs a nebulizer to treat with  the following condition  Answer:  SOB (shortness of breath)   04/13/18 1029   04/13/18 0955  For home use only DME oxygen  Once    Question Answer Comment  Mode or (Route) Nasal cannula   Liters per Minute 3   Frequency Continuous (stationary and portable oxygen unit needed)   Oxygen conserving device Yes   Oxygen delivery system Gas      04/13/18 0954   04/13/18 0948  For home use only DME Bedside commode  Once    Question:  Patient needs a bedside commode to treat with the following condition  Answer:  SOB (shortness of breath)   04/13/18 0948   04/13/18 0948  For home use only DME Walker  Once    Question:  Patient needs a walker to treat with the following condition  Answer:  SOB (shortness of breath)   04/13/18 0948   04/13/18 0947  For home use only DME Hospital bed  Once    Question Answer Comment  The above medical condition requires: Patient requires the ability to reposition frequently   Head must be elevated greater than: 30 degrees   Bed type Semi-electric      04/13/18 0948         No Known Allergies Follow-up Information    Scot Jun, FNP. Schedule an appointment as soon as possible for a visit in 1 week(s).   Specialty:  Family Medicine Why:  Hospital follow up Contact information: [place] [address] [phone]        Curt Bears, MD. Schedule an appointment as soon as possible for a visit in 1 week(s).   Specialty:  Oncology Why:  Hospital follow up Contact information: [place]  [address] [phone]            The results of significant diagnostics from this hospitalization (including imaging, microbiology, ancillary and laboratory) are listed below for reference.    Significant Diagnostic Studies: Dg Chest 2 View  Result Date: 04/09/2018 CLINICAL DATA:  Shortness of breath and productive cough EXAM: CHEST - 2 VIEW COMPARISON:  03/18/2018 FINDINGS: Known bulky necrotic  mass at the right apex. No acute opacity, edema, or effusion. Normal heart size. There is hyperinflation from COPD IMPRESSION: 1. Known necrotic mass at the right apex. The mass narrows the lower trachea. 2. No new abnormality. Electronically Signed   By: Monte Fantasia M.D.   On: 04/09/2018 07:37   Dg Chest 2 View  Result Date: 03/17/2018 CLINICAL DATA:  Shortness of breath EXAM: CHEST - 2 VIEW COMPARISON:  Chest CT 12/04/2017 FINDINGS: Consolidation of the right upper lobe consistent with known upper lobe mass and likely postobstructive atelectasis. No pleural effusion. Heart size normal. The left lung is clear. IMPRESSION: Known right suprahilar mass with associated upper lobe atelectasis. Lungs are otherwise clear. Electronically Signed   By: Ulyses Jarred M.D.   On: 03/17/2018 18:01   Ct Angio Chest Pe W And/or Wo Contrast  Result Date: 03/17/2018 CLINICAL DATA:  Shortness of breath and back pain. Known right lung mass. EXAM: CT ANGIOGRAPHY CHEST CT ABDOMEN AND PELVIS WITH CONTRAST TECHNIQUE: Multidetector CT imaging of the chest was performed using the standard protocol during bolus administration of intravenous contrast. Multiplanar CT image reconstructions and MIPs were obtained to evaluate the vascular anatomy. Multidetector CT imaging of the abdomen and pelvis was performed using the standard protocol during bolus administration of intravenous contrast. CONTRAST:  171mL ISOVUE-370 IOPAMIDOL (ISOVUE-370) INJECTION 76% COMPARISON:  Chest CT 12/04/2017 FINDINGS: CTA CHEST FINDINGS Cardiovascular: Contrast injection is sufficient to demonstrate satisfactory opacification of the pulmonary arteries to the segmental level. There is no pulmonary embolus. The main pulmonary artery is within normal limits for size. 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 The inter lobar artery is now also occluded. There is calcific aortic  atherosclerosis. No dissection. There is a normal variant aortic arch branching pattern with the brachiocephalic and left common carotid arteries sharing a common origin. Heart size is normal, without pericardial effusion. There are large paraspinal venous collaterals. Mediastinum/Nodes: Right-sided mediastinal fat is effaced by the low right suprahilar mass. Multiple subcentimeter bilateral axillary lymph nodes. Lungs/Pleura: There is debris within the right mainstem bronchus and bronchus intermedius. Right upper lobe bronchus is occluded. There is a large suprahilar right lung mass that appears unchanged in size. The mass is difficult to separate from the surrounding lung. 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 Increased confluent nodularity in the left lower lobe. Clustered nodules in the left upper lobe have increased in size, measuring up to 12 mm. Musculoskeletal: No chest wall abnormality. No acute or significant osseous findings. Review of the MIP images confirms the above findings. CT ABDOMEN and PELVIS FINDINGS Hepatobiliary: Normal hepatic contours and density. No visible biliary dilatation. Normal gallbladder. Pancreas: Normal contours without ductal dilatation. No peripancreatic fluid collection. Spleen: Normal. Adrenals/Urinary Tract: Normal adrenal glands. 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 Stomach/Bowel: Matted bowel in the lower abdomen and pelvis. No dilatation or inflammation visualized. Vascular/Lymphatic: Atherosclerotic calcification is present within the non-aneurysmal abdominal aorta, without hemodynamically significant stenosis. Major abdominal veins are patent. No abdominal or pelvic lymphadenopathy. Reproductive: There are multiple enhancing nodules within the posterior left pelvis. Difficult to accurately localize these lesions due to crowding pelvic structures, but these may be fibroids within the uterine wall. Musculoskeletal. No bony  spinal canal stenosis or focal osseous abnormality. Other: None. IMPRESSION: 1. 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 2. Debris within the right mainstem bronchus. Right upper lobe atelectasis. 3. No acute abnormality of the abdomen or pelvis. Matted appearance of the small bowel and colon without dilatation or inflammation. 4. Enhancing nodular foci along the posterior left pelvis. Exact location is not entirely clear, but these are suspected to be subserosal fibroids of the left uterine fundus. Aortic Atherosclerosis (ICD10-I70.0). Electronically Signed   By: Ulyses Jarred M.D.   On: 03/17/2018 19:14   Mr Brain Wo Contrast  Result Date: 03/18/2018 CLINICAL DATA:  Status post lung mass biopsy, lung cancer. EXAM: MRI HEAD WITHOUT CONTRAST TECHNIQUE: Multiplanar, multiecho pulse sequences of the brain and surrounding structures were obtained without intravenous contrast. Due to cough and motion, patient  was unable to tolerate further imaging and postcontrast images not obtained. COMPARISON:  CT HEAD June 13, 2015 FINDINGS: Variable motion degraded examination, severely motion degraded axial MPGR and axial T1. INTRACRANIAL CONTENTS: No reduced diffusion to suggest acute ischemia or hypercellular tumor. No susceptibility artifact to suggest hemorrhage though sequence is severely motion degraded. The ventricles and sulci are normal for patient's age. Patchy supratentorial and pontine white matter FLAIR T2 hyperintensities. No suspicious parenchymal signal, masses, mass effect. No abnormal extra-axial fluid collections. No extra-axial masses. VASCULAR: Normal major intracranial vascular flow voids present at skull base. SKULL AND UPPER CERVICAL SPINE: No abnormal  sellar expansion. No suspicious calvarial bone marrow signal. Craniocervical junction maintained. SINUSES/ORBITS: Mild sphenoid sinus mucosal thickening. Mastoid air cells are well aerated. Included ocular globes and orbital contents are non-suspicious. OTHER: None. IMPRESSION: 1. Motion degraded examination without acute intracranial process. No identified intracranial metastasis though postcontrast sequences would be more sensitive. 2. Mild-to-moderate chronic small vessel ischemic changes. Electronically Signed   By: Elon Alas M.D.   On: 03/18/2018 20:55   Ct Abdomen Pelvis W Contrast  Result Date: 03/17/2018 CLINICAL DATA:  Shortness of breath and back pain. Known right lung mass. EXAM: CT ANGIOGRAPHY CHEST CT ABDOMEN AND PELVIS WITH CONTRAST TECHNIQUE: Multidetector CT imaging of the chest was performed using the standard protocol during bolus administration of intravenous contrast. Multiplanar CT image reconstructions and MIPs were obtained to evaluate the vascular anatomy. Multidetector CT imaging of the abdomen and pelvis was performed using the standard protocol during bolus administration of intravenous contrast. CONTRAST:  190mL ISOVUE-370 IOPAMIDOL (ISOVUE-370) INJECTION 76% COMPARISON:  Chest CT 12/04/2017 FINDINGS: CTA CHEST FINDINGS Cardiovascular: Contrast injection is sufficient to demonstrate satisfactory opacification of the pulmonary arteries to the segmental level. There is no pulmonary embolus. The main pulmonary artery is within normal limits for size. 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 The inter lobar artery is now also occluded. There is calcific aortic atherosclerosis. No dissection. There is a normal variant aortic arch branching pattern with the brachiocephalic and left common carotid arteries sharing a common origin. Heart size is normal, without pericardial effusion. There are large  paraspinal venous collaterals. Mediastinum/Nodes: Right-sided mediastinal fat is effaced by the low right suprahilar mass. Multiple subcentimeter bilateral axillary lymph nodes. Lungs/Pleura: There is debris within the right mainstem bronchus and bronchus intermedius. Right upper lobe bronchus is occluded. There is a large suprahilar right lung mass that appears unchanged in size. The mass is difficult to separate from the surrounding lung. 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 Increased confluent nodularity in the left lower lobe. Clustered nodules in the left upper lobe have increased in size, measuring up to 12 mm. Musculoskeletal: No chest wall abnormality. No acute or significant osseous findings. Review of the MIP images confirms the above findings. CT ABDOMEN and PELVIS FINDINGS Hepatobiliary: Normal hepatic contours and density. No visible biliary dilatation. Normal gallbladder. Pancreas: Normal contours without ductal dilatation. No peripancreatic fluid collection. Spleen: Normal. Adrenals/Urinary Tract: Normal adrenal glands. 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 Stomach/Bowel: Matted bowel in the lower abdomen and pelvis. No dilatation or inflammation visualized. Vascular/Lymphatic: Atherosclerotic calcification is present within the non-aneurysmal abdominal aorta, without hemodynamically significant stenosis. Major abdominal veins are patent. No abdominal or pelvic lymphadenopathy. Reproductive: There are multiple enhancing nodules within the posterior left pelvis. Difficult to accurately localize these lesions due to crowding pelvic structures, but these  may be fibroids within the uterine wall. Musculoskeletal. No bony spinal canal stenosis or focal osseous abnormality. Other: None. IMPRESSION: 1. No pulmonary embolus; however, increased vascular invasion and/or mass effect by the right suprahilar mass, resulting in new occlusion of the right middle lobe  pulmonary artery and persistent right upper lobe pulmonary artery occlusion as well as occlusion/invasion of the superior vena cava. 2. Debris within the right mainstem bronchus. Right upper lobe atelectasis. 3. No acute abnormality of the abdomen or pelvis. Matted appearance of the small bowel and colon without dilatation or inflammation. 4. Enhancing nodular foci along the posterior left pelvis. Exact location is not entirely clear, but these are suspected to be subserosal fibroids of the left uterine fundus. Aortic Atherosclerosis (ICD10-I70.0). Electronically Signed   By: Ulyses Jarred M.D.   On: 03/17/2018 19:14   Nm Pet Image Initial (pi) Skull Base To Thigh  Result Date: 04/08/2018 CLINICAL DATA:  Initial treatment strategy for lung cancer. EXAM: NUCLEAR MEDICINE PET SKULL BASE TO THIGH TECHNIQUE: 5.0 mCi F-18 FDG was injected intravenously. Full-ring PET imaging was performed from the skull base to thigh after the radiotracer. CT data was obtained and used for attenuation correction and anatomic localization. Fasting blood glucose: 132 mg/dl COMPARISON:  CT chest abdomen pelvis 03/17/2018. FINDINGS: Mediastinal blood pool activity: SUV max 1.6 NECK: No abnormal hypermetabolism in the neck. Incidental CT findings: None. CHEST: Bilateral mediastinal lymph nodes show minimal hypermetabolism, with an SUV max of 2.2 in a 10 mm right paratracheal lymph node (CT image 41). A large necrotic mass occupies the right upper lobe, extends into the mediastinum and to the carina, and measures at least 7.3 x 9.7 cm, with an SUV max of 12.6. 0.9 x 1.6 cm left upper lobe nodule (CT image 20) has an SUV max of 6.0. Small area of patchy ground-glass in the superior segment left lower lobe is minimally hypermetabolic and nonspecific. Incidental CT findings: Atherosclerotic calcification of the arterial vasculature. Pulmonic trunk is enlarged. No pericardial effusion. Tiny right pleural effusion. Obstruction of the right  upper lobe bronchus and marked narrowing of the right middle lobe bronchus, due to tumor. New near-total postobstructive collapse/consolidation in the right middle lobe. Debris in the right lower lobe bronchus with peribronchovascular nodularity in the right lower lobe. ABDOMEN/PELVIS: No abnormal hypermetabolism in the liver, adrenal glands, spleen or pancreas. No hypermetabolic lymph nodes. Incidental CT findings: Liver and gallbladder are grossly unremarkable. Difficult to exclude bilateral adrenal thickening. Tiny stones in the right kidney. Low-attenuation lesions in the kidneys are difficult to characterize without post-contrast imaging. Prominent extrarenal pelves. Spleen, pancreas, stomach and bowel are grossly unremarkable. Atherosclerotic calcification of the arterial vasculature without abdominal aortic aneurysm. SKELETON: No abnormal osseous hypermetabolism. Incidental CT findings: None. IMPRESSION: 1. Large necrotic hypermetabolic right upper lobe mass with mediastinal and airway invasion. Associated minimally hypermetabolic mediastinal lymph nodes and hypermetabolic left upper lobe nodule. Findings are most consistent with stage IV primary bronchogenic carcinoma. 2. Tiny right pleural effusion. 3. Right-sided airway narrowing/obstruction, as detailed above, with new near total collapse/consolidation of the right middle lobe and likely postobstructive peribronchovascular nodularity in the right lower lobe. 4. Right renal stones. 5.  Aortic atherosclerosis (ICD10-170.0). 6. Enlarged pulmonic trunk, indicative of pulmonary arterial hypertension. Electronically Signed   By: Lorin Picket M.D.   On: 04/08/2018 09:50   Ct Biopsy  Result Date: 03/19/2018 INDICATION: 62 year old female with a history right upper lobe tumor. She has been referred for biopsy. EXAM: CT BIOPSY MEDICATIONS:  None. ANESTHESIA/SEDATION: Moderate (conscious) sedation was employed during this procedure. A total of Versed 1.0 mg  and Fentanyl 50 mcg was administered intravenously. Moderate Sedation Time: 10 minutes. The patient's level of consciousness and vital signs were monitored continuously by radiology nursing throughout the procedure under my direct supervision. FLUOROSCOPY TIME:  CT COMPLICATIONS: None PROCEDURE: The procedure, risks, benefits, and alternatives were explained to the patient and the patient's family. Specific risks that were addressed included bleeding, infection, pneumothorax, need for further procedure including chest tube placement, chance of delayed pneumothorax or hemorrhage, hemoptysis, nondiagnostic sample, cardiopulmonary collapse, death. Questions regarding the procedure were encouraged and answered. The patient understands and consents to the procedure. Patient was positioned in the supine position on the CT gantry table and a scout CT of the chest was performed for planning purposes. Once angle of approach was determined, the skin and subcutaneous tissues this scan was prepped and draped in the usual sterile fashion, and a sterile drape was applied covering the operative field. A sterile gown and sterile gloves were used for the procedure. Local anesthesia was provided with 1% Lidocaine. The skin and subcutaneous tissues were infiltrated 1% lidocaine for local anesthesia, and a small stab incision was made with an 11 blade scalpel. Using CT guidance, a 17 gauge trocar needle was advanced into the safest aspect of the right upper lobetarget. After confirmation of the tip, the stylet was withdrawn. Immediately serous fluid returned, tidling with the breathing. A single 18 gauge core biopsy was attempted with no tissue. At this time we withdrew from the case and removed the needle. Patient tolerated the procedure well and remained hemodynamically stable throughout. No complications were encountered and no significant blood loss was encounter IMPRESSION: Status post CT-guided attempt at right upper lobe mass  biopsy. The central portion of the tumor was avoided given the high risk, and the peripheral aspect of the tumor was avoided given the high likelihood of atelectatic/postobstructive lung. In the absence of a correlative PET-CT, the needle was placed in the central/safest portion of the tumor, with discovery of frankly serous fluid/cyst. No sample was achieved. Signed, Dulcy Fanny. Dellia Nims, RPVI [facility] Radiology Electronically Signed   By: Corrie Mckusick D.O.   On: 03/19/2018 08:13   Dg Chest Port 1 View  Result Date: 04/13/2018 CLINICAL DATA:  62 year old female with leukocytosis and known necrosis of the right lung with obstructing hilar FDG avid mass. EXAM: PORTABLE CHEST 1 VIEW COMPARISON:  04/12/2018, 04/11/2018, 04/09/2018 FINDINGS: Persisting opacity of the right upper lung extending from the hilum superiorly extending to the pleuroparenchymal interface of the superior chest and along the mediastinum. No shift of the tracheal air column. No internal lucency/gas observed within the opacity. Left lung relatively well aerated. No new pleural effusion. No pneumothorax. IMPRESSION: Similar appearance of known consolidation of the right lung associated with known obstructing hilar mass. Left lung relatively well aerated. Electronically Signed   By: Corrie Mckusick D.O.   On: 04/13/2018 11:09   Dg Chest Port 1 View  Result Date: 04/12/2018 CLINICAL DATA:  Shortness of breath EXAM: PORTABLE CHEST 1 VIEW COMPARISON:  04/11/2018 FINDINGS: No change since yesterday's study. Emphysematous changes in the lungs. Large right upper lung mass. Heart size is normal. IMPRESSION: No change since yesterday's study. Electronically Signed   By: Lucienne Capers M.D.   On: 04/12/2018 06:29   Dg Chest Port 1 View  Result Date: 04/11/2018 CLINICAL DATA:  Shortness of breath EXAM:  PORTABLE CHEST 1 VIEW COMPARISON:  04/09/2018 FINDINGS: Large mass in the right upper lung  and apex with probable small area of central cavitation. There is narrowing of the distal tracheal air shadow. Left lung is clear and expanded. Emphysematous changes on the left. Heart size and pulmonary vascularity are normal. No significant change allowing for differences in technique. IMPRESSION: Large mass in the right upper lung with probable central cavitation. No significant change. Electronically Signed   By: Lucienne Capers M.D.   On: 04/11/2018 06:08   Dg Chest Port 1 View  Result Date: 03/18/2018 CLINICAL DATA:  Productive cough with clear mucus. Smoker since age 55. Lung mass. EXAM: PORTABLE CHEST 1 VIEW COMPARISON:  03/17/2018 CT and CXR FINDINGS: Unchanged pulmonary mass occupying much of the right upper thorax. Emphysematous hyperinflation of. Heart and mediastinal contours to the extent visualized appear stable. No aggressive osseous lesions. IMPRESSION: Redemonstration of right upper pulmonary mass in the setting of COPD. No significant change. Electronically Signed   By: Ashley Royalty M.D.   On: 03/18/2018 20:44   Vas [place] Upper Extremity Venous Duplex  Result Date: 03/24/2018 UPPER VENOUS STUDY  Indications: Swelling Performing Technologist: Abram Sander  Examination Guidelines: A complete evaluation includes B-mode imaging, spectral Doppler, color Doppler, and power Doppler as needed of all accessible portions of each vessel. Bilateral testing is considered an integral part of a complete examination. Limited examinations for reoccurring indications may be performed as noted.  Right Findings: +----------+------------+----------+---------+-----------+-------+ RIGHT     CompressiblePropertiesPhasicitySpontaneousSummary +----------+------------+----------+---------+-----------+-------+ IJV           None                 No        No             +----------+------------+----------+---------+-----------+-------+ Subclavian    None                 No        No              +----------+------------+----------+---------+-----------+-------+ Axillary      None                 No        No             +----------+------------+----------+---------+-----------+-------+ Brachial    Partial                                         +----------+------------+----------+---------+-----------+-------+ Radial        Full                                          +----------+------------+----------+---------+-----------+-------+ Ulnar         Full                                          +----------+------------+----------+---------+-----------+-------+ Cephalic      Full                                          +----------+------------+----------+---------+-----------+-------+ Basilic  None                                          +----------+------------+----------+---------+-----------+-------+  Left Findings: +----------+------------+----------+---------+-----------+-------+ LEFT      CompressiblePropertiesPhasicitySpontaneousSummary +----------+------------+----------+---------+-----------+-------+ IJV           None                 No        No             +----------+------------+----------+---------+-----------+-------+ Subclavian    Full                 Yes       Yes            +----------+------------+----------+---------+-----------+-------+ Axillary                           Yes       Yes            +----------+------------+----------+---------+-----------+-------+ Brachial    Partial                                         +----------+------------+----------+---------+-----------+-------+ Radial        Full                                          +----------+------------+----------+---------+-----------+-------+ Ulnar         Full                                          +----------+------------+----------+---------+-----------+-------+ Cephalic      Full                                           +----------+------------+----------+---------+-----------+-------+ Basilic       Full                                          +----------+------------+----------+---------+-----------+-------+  Summary:  Right: Findings consistent with acute deep vein thrombosis involving the right internal jugular veins, right subclavian veins, right axillary vein and right brachial veins.  Left: Findings consistent with acute deep vein thrombosis involving the left internal jugular veins and left brachial veins.  *See table(s) above for measurements and observations.  Diagnosing physician: Harold Barban MD Electronically signed by Harold Barban MD on 03/24/2018 at 6:55:33 PM.    Final     Microbiology: No results found for this or any previous visit (from the past 240 hour(s)).   Labs: Basic Metabolic Panel: Recent Labs  Lab 04/09/18 0753 04/10/18 0635 04/11/18 0535 04/12/18 0550 04/13/18 0606  NA 130* 132* 131* 134* 133*  K 3.7 4.4 4.9 4.9 4.8  CL 96* 96* 95* 98 94*  CO2 26 28 27 28 31   GLUCOSE 142* 133* 150* 130* 102*  BUN 10 17 19 19 20   CREATININE  0.58 0.62 0.53 0.51 0.56  CALCIUM 8.7* 9.2 9.7 9.4 9.8  MG  --   --  2.3 2.2  --   PHOS  --   --  2.7 2.8  --    Liver Function Tests: Recent Labs  Lab 04/09/18 0753 04/10/18 0635 04/11/18 0535 04/12/18 0550  AST 11* 13* 14* 25  ALT 10 11 13 22   ALKPHOS 56 51 62 61  BILITOT 0.4 0.3 0.4 0.4  PROT 7.0 6.9 7.5 7.1  ALBUMIN 2.5* 2.5* 2.9* 2.7*   No results for input(s): LIPASE, AMYLASE in the last 168 hours. No results for input(s): AMMONIA in the last 168 hours. CBC: Recent Labs  Lab 04/09/18 0753 04/10/18 0635 04/11/18 0535 04/12/18 0550 04/13/18 0606  WBC 10.8* 15.1* 15.0* 14.0* 19.2*  NEUTROABS 9.5*  --  13.8* 12.8* 16.8*  HGB 7.5* 7.1* 10.0* 10.1* 10.8*  HCT 23.4* 22.0* 31.3* 31.2* 34.8*  MCV 95.1 93.2 91.8 93.1 93.5  PLT 303 335 376 380 384   Cardiac Enzymes: Recent Labs  Lab 04/09/18 0753  TROPONINI  <0.03   BNP: BNP (last 3 results) Recent Labs    04/09/18 0753  BNP 37.8    ProBNP (last 3 results) No results for input(s): PROBNP in the last 8760 hours.  CBG: Recent Labs  Lab 04/12/18 1145 04/12/18 1645 04/12/18 2020 04/13/18 0724 04/13/18 1137  GLUCAP 132* 143* 131* 94 116*       Signed:  Shamon Lobo  [facility] 04/13/2018, 12:48 PM

## 2018-04-13 NOTE — Progress Notes (Signed)
Patient's nephew called stating that [facility] would have to re-attempt to deliver hospital bed to house from 10a-2p tomorrow.  Bed was larger than what they had expected and there wasn't enough room for it to fit.

## 2018-04-13 NOTE — Discharge Instructions (Signed)
Acute Respiratory Distress Syndrome, Adult °Acute respiratory distress syndrome is a life-threatening condition in which fluid collects in the lungs. This prevents the lungs from filling with air and passing oxygen into the blood. This can cause the lungs and other vital organs to fail. The condition usually develops following an infection, illness, surgery, or injury. °What are the causes? °This condition may be caused by: °· An infection, such as sepsis or pneumonia. °· A serious injury to the head or chest. °· Severe bleeding from an injury. °· A major surgery. °· Breathing in harmful chemicals or smoke. °· Blood transfusions. °· A blood clot in the lungs. °· Breathing in vomit (aspiration). °· Near-drowning. °· Inflammation of the pancreas (pancreatitis). °· A drug overdose. °What are the signs or symptoms? °Sudden shortness of breath and rapid breathing are the main symptoms of this condition. Other symptoms may include: °· A fast or irregular heartbeat. °· Skin, lips, or fingernails that look blue (cyanosis). °· Confusion. °· Tiredness or loss of energy. °· Chest pain, particularly while taking a breath. °· Coughing. °· Restlessness or anxiety. °· Fever. This is usually present if there is an underlying infection, such as pneumonia. °How is this diagnosed? °This condition is diagnosed based on: °· Your symptoms. °· Medical history. °· A physical exam. During the exam, your health care provider will listen to your heart and check for crackling or wheezing sounds in your lungs. °You may also have other tests to confirm the diagnosis and measure how well your lungs are working. These may include: °· Measuring the amount of oxygen in your blood. Your health care provider will use two methods to do this procedure: °¨ A small device (pulse oximeter) that is placed on your finger, earlobe, or toe. °¨ An arterial blood gas test. A sample of blood is taken from an artery and tested for oxygen levels. °· Blood  tests. °· Chest X-rays or CT scans to look for fluid in the lungs. °· Taking a sample of your sputum to test for infection. °· Heart test, such as an echocardiogram or electrocardiogram. This is done to rule out any heart problems (such as heart failure) that may be causing your symptoms. °· Bronchoscopy. During this test, a thin, flexible tube with a light is passed into the mouth or nose, down the windpipe, and into the lungs. °How is this treated? °Treatment depends on the cause of your condition. The goal is to support you while your lungs heal and the underlying cause is treated. Treatment may include: °· Oxygen therapy. This may be done through: °¨ A tube in your nose or a face mask. °¨ A ventilator. This device helps move air into and out of your lungs through a breathing tube that is inserted into your mouth or nose. °· Continuous positive airway pressure (CPAP). This treatment uses mild air pressure to keep the airways open. A mask or other device will be placed over your nose or mouth. °· Tracheostomy. During this procedure, a small cut is made in your neck to create an opening to your windpipe. A breathing tube is placed directly into your windpipe. The breathing tube is connected to a ventilator. This is done if you have problems with your airway or if you need a ventilator for a long period of time. °· Positioning you to lie on your stomach (prone position). °· Medicines, such as: °¨ Sedatives to help you relax. °¨ Blood pressure medicines. °¨ Antibiotics to treat infection. °¨ Blood thinners   to prevent blood clots. °¨ Diuretics to help prevent excess fluid. °· Fluids and nutrients given through an IV tube. °· Wearing compression stockings on your legs to prevent blood clots. °· Extra corporeal membrane oxygenation (ECMO). This treatment takes blood outside your body, adds oxygen, and removes carbon dioxide. The blood is then returned to your body. This treatment is only used in severe cases. °Follow  these instructions at home: °· Take over-the-counter and prescription medicines only as told by your health care provider. °· Do not use any products that contain nicotine or tobacco, such as cigarettes and e-cigarettes. If you need help quitting, ask your health care provider. °· Limit alcohol intake to no more than 1 drink per day for nonpregnant women and 2 drinks per day for men. One drink equals 12 oz of beer, 5 oz of wine, or 1½ oz of hard liquor. °· Ask friends and family to help you if daily activities make you tired. °· Attend any pulmonary rehabilitation as told by your health care provider. This may include: °¨ Education about your condition. °¨ Exercises. °¨ Breathing training. °¨ Counseling. °¨ Learning techniques to conserve energy. °¨ Nutrition counseling. °· Keep all follow-up visits as told by your health care provider. This is important. °Contact a health care provider if: °· You become short of breath during activity or while resting. °· You develop a cough that does not go away. °· You have a fever. °· Your symptoms do not get better or they get worse. °· You become anxious or depressed. °Get help right away if: °· You have sudden shortness of breath. °· You develop sudden chest pain that does not go away. °· You develop a rapid heart rate. °· You develop swelling or pain in one of your legs. °· You cough up blood. °· You have trouble breathing. °· Your skin, lips, or fingernails turn blue. °These symptoms may represent a serious problem that is an emergency. Do not wait to see if the symptoms will go away. Get medical help right away. Call your local emergency services (911 in the [place] Do not drive yourself to the hospital. °Summary °· Acute respiratory distress syndrome is a life-threatening condition in which fluid collects in the lungs, which leads the lungs and other vital organs to fail. °· This condition usually develops following an infection, illness, surgery, or injury. °· Sudden  shortness of breath and rapid breathing are the main symptoms of acute respiratory distress syndrome. °· Treatment may include oxygen therapy, continuous positive airway pressure (CPAP), tracheostomy, lying on your stomach (prone position), medicines, fluids and nutrients given through an IV tube, compression stockings, and extra corporeal membrane oxygenation (ECMO). °This information is not intended to replace advice given to you by your health care provider. Make sure you discuss any questions you have with your health care provider. °Document Released: 05/06/2005 Document Revised: 04/22/2016 Document Reviewed: 04/22/2016 °Elsevier Interactive Patient Education © 2017 [employer] ° °

## 2018-04-13 NOTE — Progress Notes (Signed)
    Durable Medical Equipment  (From admission, onward)         Start     Ordered   04/13/18 0948  For home use only DME Bedside commode  Once    Question:  Patient needs a bedside commode to treat with the following condition  Answer:  SOB (shortness of breath)   04/13/18 0948   04/13/18 0948  For home use only DME oxygen  Once    Question Answer Comment  Mode or (Route) Nasal cannula   Liters per Minute 2   Frequency Continuous (stationary and portable oxygen unit needed)   Oxygen conserving device Yes   Oxygen delivery system Gas      04/13/18 0948   04/13/18 0948  For home use only DME Walker  Once    Question:  Patient needs a walker to treat with the following condition  Answer:  SOB (shortness of breath)   04/13/18 0948   04/13/18 0947  For home use only DME Hospital bed  Once    Question Answer Comment  The above medical condition requires: Patient requires the ability to reposition frequently   Head must be elevated greater than: 30 degrees   Bed type Semi-electric      04/13/18 0948

## 2018-04-14 ENCOUNTER — Ambulatory Visit: Payer: Medicaid Other

## 2018-04-14 ENCOUNTER — Ambulatory Visit: Payer: Medicaid Other | Admitting: Internal Medicine

## 2018-04-14 ENCOUNTER — Inpatient Hospital Stay: Payer: Medicaid Other

## 2018-04-14 LAB — GLUCOSE, CAPILLARY
GLUCOSE-CAPILLARY: 118 mg/dL — AB (ref 70–99)
Glucose-Capillary: 106 mg/dL — ABNORMAL HIGH (ref 70–99)
Glucose-Capillary: 169 mg/dL — ABNORMAL HIGH (ref 70–99)

## 2018-04-14 MED ORDER — HYDROCOD POLST-CPM POLST ER 10-8 MG/5ML PO SUER
5.0000 mL | Freq: Once | ORAL | Status: AC
  Administered 2018-04-14: 5 mL via ORAL
  Filled 2018-04-14: qty 5

## 2018-04-14 NOTE — Progress Notes (Signed)
Patient given discharge instructions, and verbalized an understanding of all discharge instructions.  Patient agrees with discharge plan, and is being discharged in stable medical condition.  Patient transported by [facility].  Niece [name] asked for wheelchair for patient.  Dr. Ree Kida notified, and [facility] notified.  The MD put in order for wheelchair.  Santiago Glad from [facility] asked for MD to [facility] DME note.  Dr. Ree Kida notified of note.  Patient's niece notified that she could pick up wheelchair at [address] [facility] tomorrow.

## 2018-04-14 NOTE — Progress Notes (Signed)
    Durable Medical Equipment  (From admission, onward)         Start     Ordered   04/14/18 1606  For home use only DME standard manual wheelchair with seat cushion  Once    Comments:  Patient suffers from respiratory failure and lung cancer which impairs her ability to perform daily activities like in the home.  A wheelchair will allow patient to safely perform daily activities. Patient can safely propel the wheelchair in the home or has a caregiver who can provide assistance.  Accessories: elevating leg rests (ELRs), wheel locks, extensions and anti-tippers.   04/14/18 1608   04/13/18 1030  For home use only DME Nebulizer/meds  Once    Question:  Patient needs a nebulizer to treat with the following condition  Answer:  SOB (shortness of breath)   04/13/18 1029   04/13/18 1029  For home use only DME Nebulizer machine  Once    Question:  Patient needs a nebulizer to treat with the following condition  Answer:  SOB (shortness of breath)   04/13/18 1029   04/13/18 0955  For home use only DME oxygen  Once    Question Answer Comment  Mode or (Route) Nasal cannula   Liters per Minute 3   Frequency Continuous (stationary and portable oxygen unit needed)   Oxygen conserving device Yes   Oxygen delivery system Gas      04/13/18 0954   04/13/18 0948  For home use only DME Bedside commode  Once    Question:  Patient needs a bedside commode to treat with the following condition  Answer:  SOB (shortness of breath)   04/13/18 0948   04/13/18 0948  For home use only DME Walker  Once    Question:  Patient needs a walker to treat with the following condition  Answer:  SOB (shortness of breath)   04/13/18 0948   04/13/18 0947  For home use only DME Hospital bed  Once    Question Answer Comment  The above medical condition requires: Patient requires the ability to reposition frequently   Head must be elevated greater than: 30 degrees   Bed type Semi-electric      04/13/18 0948

## 2018-04-14 NOTE — Progress Notes (Signed)
Discharge summary completed on 04/13/2018. Patient's discharge was delayed as family was waiting for DME to be delivered. Patient seen and examined today, and stable for discharge. No changes made to discharge summary, meds, or instructions.   Kaylany Tesoriero D.O. [facility] Pager [phone]  If 7PM-7AM, please contact night-coverage [email or web address] Password [email or web address] 04/14/2018, 10:16 AM

## 2018-04-15 ENCOUNTER — Ambulatory Visit: Payer: Medicaid Other

## 2018-04-15 ENCOUNTER — Other Ambulatory Visit: Payer: Self-pay

## 2018-04-15 ENCOUNTER — Encounter (HOSPITAL_COMMUNITY): Payer: Self-pay

## 2018-04-15 ENCOUNTER — Inpatient Hospital Stay (HOSPITAL_COMMUNITY)
Admission: EM | Admit: 2018-04-15 | Discharge: 2018-04-19 | DRG: 180 | Disposition: E | Payer: Medicaid Other | Attending: Pulmonary Disease | Admitting: Pulmonary Disease

## 2018-04-15 ENCOUNTER — Emergency Department (HOSPITAL_COMMUNITY): Payer: Medicaid Other

## 2018-04-15 DIAGNOSIS — Z809 Family history of malignant neoplasm, unspecified: Secondary | ICD-10-CM | POA: Diagnosis not present

## 2018-04-15 DIAGNOSIS — R6520 Severe sepsis without septic shock: Secondary | ICD-10-CM | POA: Diagnosis present

## 2018-04-15 DIAGNOSIS — Z681 Body mass index (BMI) 19 or less, adult: Secondary | ICD-10-CM | POA: Diagnosis not present

## 2018-04-15 DIAGNOSIS — R042 Hemoptysis: Secondary | ICD-10-CM

## 2018-04-15 DIAGNOSIS — Z8249 Family history of ischemic heart disease and other diseases of the circulatory system: Secondary | ICD-10-CM | POA: Diagnosis not present

## 2018-04-15 DIAGNOSIS — R0602 Shortness of breath: Secondary | ICD-10-CM | POA: Diagnosis not present

## 2018-04-15 DIAGNOSIS — Z66 Do not resuscitate: Secondary | ICD-10-CM | POA: Diagnosis present

## 2018-04-15 DIAGNOSIS — C3491 Malignant neoplasm of unspecified part of right bronchus or lung: Secondary | ICD-10-CM | POA: Diagnosis present

## 2018-04-15 DIAGNOSIS — Z9119 Patient's noncompliance with other medical treatment and regimen: Secondary | ICD-10-CM | POA: Diagnosis not present

## 2018-04-15 DIAGNOSIS — J189 Pneumonia, unspecified organism: Secondary | ICD-10-CM | POA: Diagnosis present

## 2018-04-15 DIAGNOSIS — Z7901 Long term (current) use of anticoagulants: Secondary | ICD-10-CM

## 2018-04-15 DIAGNOSIS — E871 Hypo-osmolality and hyponatremia: Secondary | ICD-10-CM | POA: Diagnosis present

## 2018-04-15 DIAGNOSIS — J9601 Acute respiratory failure with hypoxia: Secondary | ICD-10-CM

## 2018-04-15 DIAGNOSIS — I10 Essential (primary) hypertension: Secondary | ICD-10-CM | POA: Diagnosis present

## 2018-04-15 DIAGNOSIS — J440 Chronic obstructive pulmonary disease with acute lower respiratory infection: Secondary | ICD-10-CM | POA: Diagnosis present

## 2018-04-15 DIAGNOSIS — Z923 Personal history of irradiation: Secondary | ICD-10-CM

## 2018-04-15 DIAGNOSIS — Z87891 Personal history of nicotine dependence: Secondary | ICD-10-CM

## 2018-04-15 DIAGNOSIS — A419 Sepsis, unspecified organism: Secondary | ICD-10-CM | POA: Diagnosis present

## 2018-04-15 DIAGNOSIS — M549 Dorsalgia, unspecified: Secondary | ICD-10-CM | POA: Diagnosis present

## 2018-04-15 DIAGNOSIS — G8929 Other chronic pain: Secondary | ICD-10-CM | POA: Diagnosis present

## 2018-04-15 DIAGNOSIS — Z7189 Other specified counseling: Secondary | ICD-10-CM

## 2018-04-15 DIAGNOSIS — I4891 Unspecified atrial fibrillation: Secondary | ICD-10-CM | POA: Diagnosis present

## 2018-04-15 DIAGNOSIS — J9621 Acute and chronic respiratory failure with hypoxia: Secondary | ICD-10-CM | POA: Diagnosis present

## 2018-04-15 DIAGNOSIS — I959 Hypotension, unspecified: Secondary | ICD-10-CM | POA: Diagnosis present

## 2018-04-15 DIAGNOSIS — M419 Scoliosis, unspecified: Secondary | ICD-10-CM | POA: Diagnosis present

## 2018-04-15 DIAGNOSIS — E43 Unspecified severe protein-calorie malnutrition: Secondary | ICD-10-CM | POA: Diagnosis present

## 2018-04-15 DIAGNOSIS — I871 Compression of vein: Secondary | ICD-10-CM | POA: Diagnosis present

## 2018-04-15 DIAGNOSIS — Z515 Encounter for palliative care: Secondary | ICD-10-CM | POA: Diagnosis present

## 2018-04-15 DIAGNOSIS — Z79899 Other long term (current) drug therapy: Secondary | ICD-10-CM

## 2018-04-15 LAB — CBC WITH DIFFERENTIAL/PLATELET
Abs Immature Granulocytes: 0.19 10*3/uL — ABNORMAL HIGH (ref 0.00–0.07)
Basophils Absolute: 0 10*3/uL (ref 0.0–0.1)
Basophils Relative: 0 %
Eosinophils Absolute: 0 10*3/uL (ref 0.0–0.5)
Eosinophils Relative: 0 %
HCT: 33.4 % — ABNORMAL LOW (ref 36.0–46.0)
Hemoglobin: 10.5 g/dL — ABNORMAL LOW (ref 12.0–15.0)
Immature Granulocytes: 1 %
Lymphocytes Relative: 1 %
Lymphs Abs: 0.1 10*3/uL — ABNORMAL LOW (ref 0.7–4.0)
MCH: 29.2 pg (ref 26.0–34.0)
MCHC: 31.4 g/dL (ref 30.0–36.0)
MCV: 92.8 fL (ref 80.0–100.0)
Monocytes Absolute: 0.6 10*3/uL (ref 0.1–1.0)
Monocytes Relative: 3 %
Neutro Abs: 18.6 10*3/uL — ABNORMAL HIGH (ref 1.7–7.7)
Neutrophils Relative %: 95 %
Platelets: 229 10*3/uL (ref 150–400)
RBC: 3.6 MIL/uL — ABNORMAL LOW (ref 3.87–5.11)
RDW: 15.9 % — ABNORMAL HIGH (ref 11.5–15.5)
WBC: 19.6 10*3/uL — ABNORMAL HIGH (ref 4.0–10.5)
nRBC: 0 % (ref 0.0–0.2)

## 2018-04-15 LAB — BLOOD GAS, VENOUS
ACID-BASE EXCESS: 4 mmol/L — AB (ref 0.0–2.0)
BICARBONATE: 33.3 mmol/L — AB (ref 20.0–28.0)
FIO2: 21
O2 SAT: 38.1 %
PCO2 VEN: 85.7 mmHg — AB (ref 44.0–60.0)
Patient temperature: 98.6
pH, Ven: 7.213 — ABNORMAL LOW (ref 7.250–7.430)

## 2018-04-15 LAB — COMPREHENSIVE METABOLIC PANEL WITH GFR
ALT: 18 U/L (ref 0–44)
AST: 12 U/L — ABNORMAL LOW (ref 15–41)
Albumin: 2.8 g/dL — ABNORMAL LOW (ref 3.5–5.0)
Alkaline Phosphatase: 59 U/L (ref 38–126)
Anion gap: 4 — ABNORMAL LOW (ref 5–15)
BUN: 27 mg/dL — ABNORMAL HIGH (ref 8–23)
CO2: 36 mmol/L — ABNORMAL HIGH (ref 22–32)
Calcium: 9.2 mg/dL (ref 8.9–10.3)
Chloride: 92 mmol/L — ABNORMAL LOW (ref 98–111)
Creatinine, Ser: 0.46 mg/dL (ref 0.44–1.00)
GFR calc Af Amer: >60 mL/min
GFR calc non Af Amer: >60 mL/min
Glucose, Bld: 123 mg/dL — ABNORMAL HIGH (ref 70–99)
Potassium: 5 mmol/L (ref 3.5–5.1)
Sodium: 132 mmol/L — ABNORMAL LOW (ref 135–145)
Total Bilirubin: 0.7 mg/dL (ref 0.3–1.2)
Total Protein: 7.1 g/dL (ref 6.5–8.1)

## 2018-04-15 LAB — TYPE AND SCREEN
ABO/RH(D): B POS
Antibody Screen: NEGATIVE

## 2018-04-15 LAB — PROTIME-INR
INR: 1.69
Prothrombin Time: 19.7 s — ABNORMAL HIGH (ref 11.4–15.2)

## 2018-04-15 MED ORDER — METHYLPREDNISOLONE SODIUM SUCC 40 MG IJ SOLR
40.0000 mg | Freq: Four times a day (QID) | INTRAMUSCULAR | Status: DC
  Administered 2018-04-15 – 2018-04-18 (×10): 40 mg via INTRAVENOUS
  Filled 2018-04-15 (×12): qty 1

## 2018-04-15 MED ORDER — IPRATROPIUM-ALBUTEROL 0.5-2.5 (3) MG/3ML IN SOLN
3.0000 mL | RESPIRATORY_TRACT | Status: DC
  Administered 2018-04-16 (×2): 3 mL via RESPIRATORY_TRACT
  Filled 2018-04-15 (×2): qty 3

## 2018-04-15 MED ORDER — SODIUM CHLORIDE 0.9 % IV SOLN
INTRAVENOUS | Status: DC
  Administered 2018-04-15: via INTRAVENOUS
  Administered 2018-04-16 (×2): 50 mL/h via INTRAVENOUS
  Administered 2018-04-16 – 2018-04-17 (×2): via INTRAVENOUS

## 2018-04-15 MED ORDER — ONDANSETRON HCL 4 MG/2ML IJ SOLN
4.0000 mg | Freq: Once | INTRAMUSCULAR | Status: AC
  Administered 2018-04-15: 4 mg via INTRAVENOUS
  Filled 2018-04-15: qty 2

## 2018-04-15 MED ORDER — PIPERACILLIN-TAZOBACTAM 3.375 G IVPB
3.3750 g | Freq: Three times a day (TID) | INTRAVENOUS | Status: DC
  Administered 2018-04-16 – 2018-04-18 (×8): 3.375 g via INTRAVENOUS
  Filled 2018-04-15 (×8): qty 50

## 2018-04-15 MED ORDER — IOPAMIDOL (ISOVUE-370) INJECTION 76%
100.0000 mL | Freq: Once | INTRAVENOUS | Status: AC | PRN
  Administered 2018-04-15: 100 mL via INTRAVENOUS

## 2018-04-15 MED ORDER — IOPAMIDOL (ISOVUE-370) INJECTION 76%
INTRAVENOUS | Status: AC
  Filled 2018-04-15: qty 100

## 2018-04-15 MED ORDER — SODIUM CHLORIDE 0.9 % IV BOLUS
1000.0000 mL | Freq: Once | INTRAVENOUS | Status: AC
  Administered 2018-04-15: 1000 mL via INTRAVENOUS

## 2018-04-15 MED ORDER — SODIUM CHLORIDE (PF) 0.9 % IJ SOLN
INTRAMUSCULAR | Status: AC
  Filled 2018-04-15: qty 50

## 2018-04-15 MED ORDER — VANCOMYCIN HCL IN DEXTROSE 1-5 GM/200ML-% IV SOLN
1000.0000 mg | Freq: Once | INTRAVENOUS | Status: AC
  Administered 2018-04-15: 1000 mg via INTRAVENOUS
  Filled 2018-04-15: qty 200

## 2018-04-15 MED ORDER — SODIUM CHLORIDE 0.9 % IV SOLN
2.0000 g | Freq: Once | INTRAVENOUS | Status: AC
  Administered 2018-04-15: 2 g via INTRAVENOUS
  Filled 2018-04-15: qty 2

## 2018-04-15 MED ORDER — OXYCODONE-ACETAMINOPHEN 5-325 MG PO TABS
2.0000 | ORAL_TABLET | Freq: Once | ORAL | Status: AC
  Administered 2018-04-15: 2 via ORAL
  Filled 2018-04-15: qty 2

## 2018-04-15 NOTE — ED Provider Notes (Signed)
[facility] DEPT Provider Note   CSN: [id number] Arrival date & time: 04/01/2018  1242     History   Chief Complaint Chief Complaint  Patient presents with  . Shortness of Breath  . Hemoptysis    HPI Sonya [name] is a 62 y.o. female.  62 y.o female with a PMH of small cell carcinoma stage III,COPD, HTN, Afib presents to the ED with a chief complaint of shortness of breath.Patient was seen at [facility] yesterday and sent home on oxygen. She reports feeling more short of breath than usual, she was noted to be wheezing and rhonchi by EMS and given a breathing treatment.She was recently hospitalized due to SOB 11/21-11-25 she had a previous PET scan which showed a collapsed lung attributing to the hypoxia. Today she reports worsening SOB and hemoptysis which is new for patient. Patient lives at home with niece, daughter but she reports she had no ride to get to the emergency department so she called EMS. She also reports some chest pain during her shortness of breath episode but has now passed. She denies any fever, abdominal pain, back pain or other complaints. She is currently on eliquis for her Afib.   The history is provided by the patient and the EMS personnel.    Past Medical History:  Diagnosis Date  . Atrial fibrillation ([place])   . Bronchitis   . Chronic back pain   . COPD (chronic obstructive pulmonary disease) ([place])   . HTN (hypertension)   . Lung cancer ([place])   . Mass of upper lobe of right lung   . Scoliosis     Patient Active Problem List   Diagnosis Date Noted  . Acute respiratory distress 04/09/2018  . Acute on chronic respiratory failure with hypoxia ([place]) 04/09/2018  . Acute respiratory failure with hypoxia ([place]) 04/09/2018  . Non-small cell carcinoma of right lung, stage 3 ([place]) 04/02/2018  . Acute deep vein thrombosis (DVT) of axillary vein of left upper extremity ([place]) 03/25/2018  . Acute deep vein thrombosis (DVT) of axillary  vein of right upper extremity ([place]) 03/25/2018  . Hypercoagulable state, secondary ([place]) 03/25/2018  . 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 Acute deep vein thrombosis (DVT) of upper extremity ([place]) 03/24/2018  . Tachycardia 03/20/2018  . New onset atrial fibrillation ([place]) 03/20/2018  . Atrial fibrillation with RVR ([place])   . COPD with acute exacerbation ([place])   . Hypertensive urgency 03/18/2018  . HTN (hypertension) 03/18/2018  . Lung mass 03/18/2018  . 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 Pulmonary mass 03/17/2018  . Chronic anemia 03/17/2018  . Hypercalcemia 03/17/2018  . Tobacco abuse 06/20/2015  . Chronic back pain     Past Surgical History:  Procedure Laterality Date  . ENDOBRONCHIAL ULTRASOUND Bilateral 03/26/2018   Procedure: ENDOBRONCHIAL ULTRASOUND;  Surgeon: Margaretha Seeds, MD;  Location: [facility] ENDOSCOPY;  Service: Pulmonary;  Laterality: Bilateral;     OB History   None      Home Medications    Prior to Admission medications   Medication Sig Start Date End Date Taking? Authorizing Provider  albuterol (PROVENTIL HFA;VENTOLIN HFA) 108 (90 Base) MCG/ACT inhaler Inhale 2 puffs into the lungs every 6 (six) hours as needed for wheezing or shortness of breath. 04/02/18  Yes Scot Jun, FNP  apixaban (ELIQUIS) 5 MG TABS tablet Take 1 tablet (5 mg total) by mouth 2 (two) times daily. 04/03/18  Yes Charlynne Cousins, MD  feeding supplement, ENSURE ENLIVE, (  ENSURE ENLIVE) LIQD Take 237 mLs by mouth 2 (two) times daily between meals. 03/28/18  Yes Charlynne Cousins, MD  oxyCODONE-acetaminophen (PERCOCET/ROXICET) 5-325 MG tablet Take 1 tablet by mouth every 8 (eight) hours as needed for severe pain. Patient taking differently: Take 2 tablets by mouth every 8 (eight) hours as needed for severe pain.  12/04/17  Yes McDonald, Mia A, PA-C  albuterol (PROVENTIL) (2.5 MG/3ML) 0.083% nebulizer solution Take 3 mLs (2.5 mg total) by nebulization every 6 (six) hours as  needed for wheezing or shortness of breath. 04/02/18   Scot Jun, FNP  diltiazem (CARDIZEM CD) 240 MG 24 hr capsule Take 1 capsule (240 mg total) by mouth daily. 04/14/18   Mikhail, Velta Addison, DO  ipratropium (ATROVENT) 0.02 % nebulizer solution Take 2.5 mLs (0.5 mg total) by nebulization 4 (four) times daily. 04/02/18   Scot Jun, FNP  levofloxacin (LEVAQUIN) 500 MG tablet Take 1 tablet (500 mg total) by mouth daily for 5 days. 04/13/18 2018-05-12  Mikhail, Velta Addison, DO  ondansetron (ZOFRAN) 4 MG tablet Take 1 tablet (4 mg total) by mouth every 6 (six) hours as needed for nausea. 04/13/18   Mikhail, Velta Addison, DO  predniSONE (DELTASONE) 20 MG tablet Take in the morning. Take 3 tabs x 3 days, then 2 tabs x 3 days, then 1 tab x 3 days. 04/13/18   Cristal Ford, DO    Family History Family History  Problem Relation Age of Onset  . Heart disease Brother   . Cancer Brother   . Cancer Sister   . Cancer Brother     Social History Social History   Tobacco Use  . Smoking status: Former Smoker    Packs/day: 0.25    Years: 40.00    Pack years: 10.00    Types: Cigarettes    Last attempt to quit: 03/03/2018    Years since quitting: 0.1  . Smokeless tobacco: Never Used  Substance Use Topics  . Alcohol use: Not Currently  . Drug use: No     Allergies   Patient has no known allergies.   Review of Systems Review of Systems  Constitutional: Negative for fever.  Respiratory: Positive for cough (hemoptysis), shortness of breath and wheezing.   Cardiovascular: Positive for chest pain.  Gastrointestinal: Negative for abdominal pain, nausea and vomiting.  Genitourinary: Negative for dysuria and flank pain.  Musculoskeletal: Negative for back pain.  Skin: Negative for pallor and wound.  Neurological: Negative for dizziness, light-headedness and headaches.     Physical Exam Updated Vital Signs BP (!) 87/54 Comment: Pt sleeping on side  Pulse 99   Temp (!) 97.4 F (36.3  C) (Oral)   Resp (!) 28   SpO2 99%   Physical Exam  Constitutional: She is oriented to person, place, and time. She appears ill. No distress.  HENT:  Head: Normocephalic and atraumatic.  Neck: Normal range of motion. Neck supple.  Cardiovascular: Normal heart sounds.  Pulmonary/Chest: She has decreased breath sounds. She has wheezes. She has rhonchi. She exhibits no tenderness and no laceration.  Abdominal: Soft. She exhibits no distension. There is no tenderness.  Musculoskeletal:       Right lower leg: She exhibits no edema.       Left lower leg: She exhibits no edema.  Neurological: She is alert and oriented to person, place, and time.  Skin: Skin is warm and dry.  Nursing note and vitals reviewed.    ED Treatments / Results  Labs (all labs  ordered are listed, but only abnormal results are displayed) Labs Reviewed  CBC WITH DIFFERENTIAL/PLATELET - Abnormal; Notable for the following components:      Result Value   WBC 19.6 (*)    RBC 3.60 (*)    Hemoglobin 10.5 (*)    HCT 33.4 (*)    RDW 15.9 (*)    Neutro Abs 18.6 (*)    Lymphs Abs 0.1 (*)    Abs Immature Granulocytes 0.19 (*)    All other components within normal limits  PROTIME-INR - Abnormal; Notable for the following components:   Prothrombin Time 19.7 (*)    All other components within normal limits  COMPREHENSIVE METABOLIC PANEL - Abnormal; Notable for the following components:   Sodium 132 (*)    Chloride 92 (*)    CO2 36 (*)    Glucose, Bld 123 (*)    BUN 27 (*)    Albumin 2.8 (*)    AST 12 (*)    Anion gap 4 (*)    All other components within normal limits  CULTURE, BLOOD (ROUTINE X 2)  CULTURE, BLOOD (ROUTINE X 2)  I-STAT VENOUS BLOOD GAS, ED  TYPE AND SCREEN    EKG None  Radiology Ct Angio Chest Pe W And/or Wo Contrast  Result Date: 03/26/2018 CLINICAL DATA:  Shortness of breath EXAM: CT ANGIOGRAPHY CHEST WITH CONTRAST TECHNIQUE: Multidetector CT imaging of the chest was performed using  the standard protocol during bolus administration of intravenous contrast. Multiplanar CT image reconstructions and MIPs were obtained to evaluate the vascular anatomy. CONTRAST:  139mL ISOVUE-370 IOPAMIDOL (ISOVUE-370) INJECTION 76% COMPARISON:  PET CT 04/08/2018, CT chest 03/17/2018, 12/04/2017, radiograph 03/21/2018 FINDINGS: Cardiovascular: Satisfactory opacification of the pulmonary arteries to the segmental level. No acute pulmonary embolus is visualized. Non enhancement of right upper lobe pulmonary arterial vessels consistent with vascular occlusion by large right upper lobe lung mass. Occlusion or tumor invasion of the superior vena cava as before. Interim development of extensive collateral vessels within the mediastinum, left chest wall, and paraspinal regions. Mild narrowing of the right main pulmonary artery by tumor. No acute filling defects within the enhancing portions of the right lower lobe pulmonary arterial vessels. No filling defects within the left-sided vasculature. Aortic atherosclerosis without aneurysmal dilatation. Enlarged pulmonary trunk up to 3.8 cm. Heart size within normal limits. No pericardial effusion Mediastinum/Nodes: Midline trachea. No thyroid mass. Generalized soft tissue density within the mediastinum, touching the aorta and narrowing the distal trachea and right bronchus. Soft tissue density in the subcarinal region may reflect nodes or additional tumor invasion of the mediastinum. Right upper and middle lobe bronchi are essentially occluded. Right lower lobe bronchus appears somewhat narrowed. Esophagus is within normal limits. Lungs/Pleura: Small right-sided pleural effusion. Large necrotic mass in the right upper lobe with surrounding obstructive atelectasis or pneumonia, appears progressed as compared with prior CT a chest 03/17/2018. Left upper lobe 11 mm pulmonary mass is again noted. Peribronchovascular nodularity in the right lower lobe, slightly diminished since  interval PET CT. Upper Abdomen: No acute abnormality. Musculoskeletal: No acute or suspicious osseous lesion. Review of the MIP images confirms the above findings. IMPRESSION: 1. No definite acute pulmonary embolus within the visible, patent pulmonary arteries. 2. Large necrotic right upper lobe lung mass consistent with history of lung cancer. Surrounding postobstructive atelectasis and or pneumonia in the right upper lobe and right middle lobe. Again noted is mediastinal invasion by mass lesion with marked narrowing of the distal trachea and carina and  occlusion of the right upper and middle lobe bronchi. 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 Interim development of extensive collateral vessels within the mediastinum, left chest wall and paraspinal region, consistent with central venous obstruction. 3. Small right-sided pleural effusion. Stable left upper lobe pulmonary mass. Stable to slight decreased peribronchovascular nodularity in the right lower lobe Aortic Atherosclerosis (ICD10-I70.0). Electronically Signed   By: Donavan Foil M.D.   On: 04/16/2018 19:19   Dg Chest Portable 1 View  Result Date: 04/14/2018 CLINICAL DATA:  Shortness of breath. On radiation for lung mass. History of COPD. EXAM: PORTABLE CHEST 1 VIEW COMPARISON:  Chest radiograph April 13, 2018 FINDINGS: Stable appearance of large RIGHT lung consolidation/mass. Similar LEFT apical pleural thickening. No pleural effusion. No pneumothorax. Similar hyperinflation. Cardiac silhouette may be mildly enlarged, limited assessment due to consolidation. Soft tissue planes and included osseous structures are unchanged. Skin fold RIGHT chest. IMPRESSION: Stable appearance of RIGHT upper lobe consolidation/mass. Electronically Signed   By: Elon Alas M.D.   On: 04/11/2018 18:14    Procedures Procedures (including critical care time)  Medications Ordered in ED Medications  sodium chloride (PF) 0.9 % injection (has no  administration in time range)  iopamidol (ISOVUE-370) 76 % injection (has no administration in time range)  ceFEPIme (MAXIPIME) 1 g in sodium chloride 0.9 % 100 mL IVPB (has no administration in time range)  ondansetron (ZOFRAN) injection 4 mg (4 mg Intravenous Given 04/10/2018 1653)  sodium chloride 0.9 % bolus 1,000 mL (1,000 mLs Intravenous New Bag/Given 03/24/2018 1804)  iopamidol (ISOVUE-370) 76 % injection 100 mL (100 mLs Intravenous Contrast Given 04/04/2018 1816)  oxyCODONE-acetaminophen (PERCOCET/ROXICET) 5-325 MG per tablet 2 tablet (2 tablets Oral Given 03/27/2018 1921)     Initial Impression / Assessment and Plan / ED Course  I have reviewed the triage vital signs and the nursing notes.  Pertinent labs & imaging results that were available during my care of the patient were reviewed by me and considered in my medical decision making (see chart for details).     The ED after being discharged 2 days ago, she presents to the ED with worsening dyspnea along with hemoptysis. CBC and CMP consistent with patient's previous visits. No significant changes. PT 19.7, INR 1.69.  Place call to oncologist for further recommendations.  Due to hemoptysis being new will have patient stay in the hospital.  She is breathing has improved on 5 L O2 she is currently satting at 97%.  Patient had breathing treatment with EMS but began to make her cough worse.  3:42 PM Spoke to Dr. Marin Olp who stated patient has not been seen by Dr. Julien Nordmann since 11/5, however will page Dr. Julien Nordmann and they are happy to consult while in hospital. Will page Dr. Earlie Server along with hospitalist for admission.   9:45 PM Spoke to critical care physician Dr. Silvano Rusk, who will have ground team see patient in the ED. She also recommended patient be started on antibiotics for HAP. Will order blood cultures along with fluids now due to hypotension.    Final Clinical Impressions(s) / ED Diagnoses   Final diagnoses:  Hemoptysis    Shortness of breath    ED Discharge Orders    None       Janeece Fitting, Hershal Coria 04/04/2018 2236    Charlesetta Shanks, MD 04/16/18 1141

## 2018-04-15 NOTE — H&P (Signed)
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 HISTORY OF PRESENT ILLNESS   Sonya [name] is a 62 year old lady with history of squamous cell lung cancer of the right lung stage III at least with SVC syndrome, COPD on Eliquis for upper extremity thrombus coming in after 1 day of being discharged with massive hemoptysis which she describes as 1 cup.  Patient is a very poor historian not able to get much history from her but she stated that she has some shortness of breath denies any chest pain.  She has not had any more hemoptysis since she came into the emergency room but she dropped her blood pressure and hospitalist were not comfortable admitting the patient so we were consulted for further management.  Patient is receiving her first liter IV fluid bolus.  She is laying on her left side then hardly getting comfortable.  No family around to give any more history.  Patient was just discharged yesterday after being admitted for acute hypoxemic respiratory failure at that time where she was started on oxygen and was sent home on Levaquin.  SIGNIFICANT PAST MEDICAL HISTORY    past medical history of Atrial fibrillation (HCC), Bronchitis, Chronic back pain, COPD (chronic obstructive pulmonary disease) ([place]), HTN (hypertension), Lung cancer ([place]), Mass of upper lobe of right lung, and Scoliosis.  SIGNIFICANT EVENTS:  Positive hemoptysis   STUDIES:   CT scan of the chest 1. No definite acute pulmonary embolus within the visible, patent pulmonary arteries. 2. Large necrotic right upper lobe lung mass consistent with history of lung cancer. Surrounding  postobstructive atelectasis and or pneumonia in the right upper lobe and right middle lobe. Again noted is mediastinal invasion by mass lesion with marked narrowing of the distal trachea and carina and occlusion of the right upper and middle lobe bronchi. 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 Interim development of extensive collateral vessels within the mediastinum, left chest wall and paraspinal region, consistent with central venous obstruction. 3. Small right-sided pleural effusion. Stable left upper lobe pulmonary mass. Stable to slight decreased peribronchovascular nodularity in the right lower lobe CULTURES:  Sputum culture  ANTIBIOTICS:  Zosyn Vancomycin  LINES/TUBES:    CONSULTANTS:   SUBJECTIVE:    CONSTITUTIONAL: BP 131/85   Pulse (!) 115   Temp (!) 97.4 F (36.3 C) (Oral)   Resp (!) 23   SpO2 98%   No intake/output data recorded.        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   DVT PROPHYLAXIS: SCDs  sUP: None NUTRITION: N.p.o. MOBILITY: Bedrest GOALS OF CARE: Discussed goals of care with the patient patient is DNR FAMILY DISCUSSIONS: No family around DISPOSITION ICU  LABS  Glucose Recent Labs  Lab 04/13/18 1137 04/13/18 1625 04/13/18 2100 04/14/18 0731 04/14/18 1132 04/14/18 1652  GLUCAP 116* 123* 120* 106* 118* 169*    BMET Recent Labs  Lab 04/12/18 0550 04/13/18 0606 04/14/2018 1650  NA 134* 133* 132*  K 4.9 4.8 5.0  CL 98 94* 92*  CO2 28 31 36*  BUN 19 20 27*  CREATININE 0.51 0.56 0.46  GLUCOSE 130* 102* 123*    Liver Enzymes Recent Labs  Lab 04/11/18 0535 04/12/18 0550 03/25/2018 1650  AST 14* 25 12*  ALT 13 22 18   ALKPHOS 62 61 59  BILITOT 0.4 0.4 0.7  ALBUMIN 2.9* 2.7* 2.8*    Electrolytes Recent Labs  Lab 04/11/18 0535 04/12/18 0550 04/13/18 0606 03/22/2018 1650  CALCIUM 9.7 9.4 9.8 9.2  MG 2.3 2.2  --   --   PHOS 2.7 2.8  --   --     CBC Recent Labs  Lab 04/12/18 0550 04/13/18 0606 04/02/2018 1603  WBC 14.0* 19.2* 19.6*  HGB 10.1* 10.8* 10.5*  HCT 31.2* 34.8* 33.4*  PLT 380 384 229    ABG No results for input(s): PHART, PCO2ART, PO2ART in the last 168 hours.  Coag's Recent Labs  Lab 03/24/2018 1603  INR 1.69    Sepsis Markers No results for input(s): LATICACIDVEN, PROCALCITON, O2SATVEN in the last 168 hours.  Cardiac Enzymes Recent Labs  Lab 04/09/18 0753  TROPONINI <0.03    PAST MEDICAL HISTORY :   She  has a past medical history of Atrial fibrillation ([place]), Bronchitis, Chronic back pain, COPD (chronic obstructive pulmonary disease) ([place]), HTN (hypertension), Lung cancer ([place]), Mass of upper lobe of right lung, and Scoliosis.  PAST SURGICAL HISTORY:  She  has a past surgical history that includes Endobronchial ultrasound (Bilateral, 03/26/2018).  No Known Allergies  No current facility-administered medications on file prior  to encounter.    Current Outpatient Medications on File Prior to Encounter  Medication Sig  . albuterol (PROVENTIL HFA;VENTOLIN HFA) 108 (90 Base) MCG/ACT inhaler Inhale 2 puffs into the lungs every 6 (six) hours as needed for wheezing or shortness of breath.  Marland Kitchen apixaban (ELIQUIS) 5 MG TABS tablet Take 1 tablet (5 mg total) by mouth 2 (two) times daily.  . feeding supplement, ENSURE ENLIVE, (ENSURE ENLIVE) LIQD Take 237 mLs by mouth 2 (two) times daily between meals.  Marland Kitchen oxyCODONE-acetaminophen (PERCOCET/ROXICET) 5-325 MG tablet Take 1 tablet by mouth every 8 (eight) hours as needed for severe pain. (Patient taking differently: Take 2 tablets by mouth every 8 (eight) hours as needed for severe pain. )  . albuterol (PROVENTIL) (2.5 MG/3ML) 0.083% nebulizer solution Take 3 mLs (2.5 mg total) by nebulization every 6 (six) hours as needed for wheezing or shortness of breath.  . diltiazem (CARDIZEM CD) 240 MG 24 hr capsule Take 1 capsule (240 mg total) by mouth daily.  Marland Kitchen ipratropium (ATROVENT) 0.02 % nebulizer solution Take 2.5 mLs (0.5 mg total) by nebulization 4 (four) times daily.  Marland Kitchen levofloxacin (LEVAQUIN) 500 MG tablet Take 1 tablet (  500 mg total) by mouth daily for 5 days.  . ondansetron (ZOFRAN) 4 MG tablet Take 1 tablet (4 mg total) by mouth every 6 (six) hours as needed for nausea.  . predniSONE (DELTASONE) 20 MG tablet Take in the morning. Take 3 tabs x 3 days, then 2 tabs x 3 days, then 1 tab x 3 days.    FAMILY HISTORY:   Her family history includes Cancer in her brother, brother, and sister; Heart disease in her brother.  SOCIAL HISTORY:  She  reports that she quit smoking about 6 weeks ago. Her smoking use included cigarettes. She has a 10.00 pack-year smoking history. She has never used smokeless tobacco. She reports that she drank alcohol. She reports that she does not use drugs.  REVIEW OF SYSTEMS:    All 11 point system review were unremarkable other than what is mentioned history  of present illness

## 2018-04-15 NOTE — ED Notes (Signed)
Bed: BR49 Expected date:  Expected time:  Means of arrival:  Comments: EMS-SOB

## 2018-04-15 NOTE — ED Triage Notes (Addendum)
Patient BIB EMS from home with complaints of SOB. Patient was seen yesterday at [facility] for the same. Patient has history of right lung mass and is receiving radiation. Patient on blood thinners and is coughing up blood. Patient has history of asthma. Patient was noted by EMS to be tripoding with audible wheezing and rhonchi. 5.0mg  Atrovent and 5.0 Albuterol nebs administered by EMS. Patient has history of Afib and is in Afib for EMS. CBG=146 for EMS. 20g Right hand PIV placed by EMS.

## 2018-04-15 NOTE — ED Notes (Signed)
ED TO INPATIENT HANDOFF REPORT  Name/Age/Gender Sonya [name] 62 y.o. female  Code Status    Code Status Orders  (From admission, onward)         Start     Ordered   03/27/2018 2225  Do not attempt resuscitation (DNR)  Continuous    Question Answer Comment  In the event of cardiac or respiratory ARREST Do not call a "code blue"   In the event of cardiac or respiratory ARREST Do not perform Intubation, CPR, defibrillation or ACLS   In the event of cardiac or respiratory ARREST Use medication by any route, position, wound care, and other measures to relive pain and suffering. May use oxygen, suction and manual treatment of airway obstruction as needed for comfort.      04/13/2018 2226        Code Status History    Date Active Date Inactive Code Status Order ID Comments User Context   04/09/2018 0950 04/14/2018 2311 Full Code [id number]  Damita Lack, MD ED   03/17/2018 2101 03/28/2018 1434 Full Code [id number]  Shela Leff, MD ED      Home/SNF/Other Home  Chief Complaint short of breath   Level of Care/Admitting Diagnosis ED Disposition    ED Disposition Condition [place]: [facility] [[id number]]  Level of Care: ICU [6]  Diagnosis: Hemoptysis [[id number]]  Admitting Physician: Aldean Jewett [[id number]]  Attending Physician: Aldean Jewett [[id number]]  Estimated length of stay: 5 - 7 days  Certification:: I certify this patient will need inpatient services for at least 2 midnights  PT Class (Do Not Modify): Inpatient [101]  PT Acc Code (Do Not Modify): Private [1]       Medical History Past Medical History:  Diagnosis Date  . Atrial fibrillation ([place])   . Bronchitis   . Chronic back pain   . COPD (chronic obstructive pulmonary disease) ([place])   . HTN (hypertension)   . Lung cancer ([place])   . Mass of upper lobe of right lung   . Scoliosis     Allergies No Known Allergies  IV Location/Drains/Wounds Patient  Lines/Drains/Airways Status   Active Line/Drains/Airways    Name:   Placement date:   Placement time:   Site:   Days:   Peripheral IV 03/23/2018 Right Hand   03/27/2018    -    Hand   less than 1   Peripheral IV 03/22/2018 Left Antecubital   04/14/2018    1804    Antecubital   less than 1          Labs/Imaging Results for orders placed or performed during the hospital encounter of 03/26/2018 (from the past 48 hour(s))  CBC with Differential     Status: Abnormal   Collection Time: 04/02/2018  4:03 PM  Result Value Ref Range   WBC 19.6 (H) 4.0 - 10.5 K/uL   RBC 3.60 (L) 3.87 - 5.11 MIL/uL   Hemoglobin 10.5 (L) 12.0 - 15.0 g/dL   HCT 33.4 (L) 36.0 - 46.0 %   MCV 92.8 80.0 - 100.0 fL   MCH 29.2 26.0 - 34.0 pg   MCHC 31.4 30.0 - 36.0 g/dL   RDW 15.9 (H) 11.5 - 15.5 %   Platelets 229 150 - 400 K/uL   nRBC 0.0 0.0 - 0.2 %   Neutrophils Relative % 95 %   Neutro Abs 18.6 (H) 1.7 - 7.7 K/uL   Lymphocytes Relative 1 %  Lymphs Abs 0.1 (L) 0.7 - 4.0 K/uL   Monocytes Relative 3 %   Monocytes Absolute 0.6 0.1 - 1.0 K/uL   Eosinophils Relative 0 %   Eosinophils Absolute 0.0 0.0 - 0.5 K/uL   Basophils Relative 0 %   Basophils Absolute 0.0 0.0 - 0.1 K/uL   Immature Granulocytes 1 %   Abs Immature Granulocytes 0.19 (H) 0.00 - 0.07 K/uL    Comment: Performed at [facility], [place] [address], [place], [place] [address]  Protime-INR     Status: Abnormal   Collection Time: 03/30/2018  4:03 PM  Result Value Ref Range   Prothrombin Time 19.7 (H) 11.4 - 15.2 seconds   INR 1.69     Comment: Performed at [facility], [place] [address] [place], [place] [address]  Type and screen [place]     Status: None   Collection Time: 03/22/2018  4:03 PM  Result Value Ref Range   ABO/RH(D) B POS    Antibody Screen NEG    Sample Expiration      04/21/2018 Performed at [facility], [place] [address] [place], [place] [address]   Comprehensive  metabolic panel     Status: Abnormal   Collection Time: 03/27/2018  4:50 PM  Result Value Ref Range   Sodium 132 (L) 135 - 145 mmol/L   Potassium 5.0 3.5 - 5.1 mmol/L   Chloride 92 (L) 98 - 111 mmol/L   CO2 36 (H) 22 - 32 mmol/L   Glucose, Bld 123 (H) 70 - 99 mg/dL   BUN 27 (H) 8 - 23 mg/dL   Creatinine, Ser 0.46 0.44 - 1.00 mg/dL   Calcium 9.2 8.9 - 10.3 mg/dL   Total Protein 7.1 6.5 - 8.1 g/dL   Albumin 2.8 (L) 3.5 - 5.0 g/dL   AST 12 (L) 15 - 41 U/L   ALT 18 0 - 44 U/L   Alkaline Phosphatase 59 38 - 126 U/L   Total Bilirubin 0.7 0.3 - 1.2 mg/dL   GFR calc non Af Amer >60 >60 mL/min   GFR calc Af Amer >60 >60 mL/min   Anion gap 4 (L) 5 - 15    Comment: Performed at [facility], [place] [address] [place], [place] [address]  Blood gas, venous     Status: Abnormal   Collection Time: 04/13/2018  9:49 PM  Result Value Ref Range   FIO2 21.00    pH, Ven 7.213 (L) 7.250 - 7.430   pCO2, Ven 85.7 (HH) 44.0 - 60.0 mmHg    Comment: CRITICAL RESULT CALLED TO, READ BACK BY AND VERIFIED WITH: SOTO,JOHANA PA AT 2200 BY PATRICK SWEENEY RRT, RCP ON 04/03/2018     pO2, Ven BELOW REPORTABLE RANGE 32.0 - 45.0 mmHg    Comment: CRITICAL RESULT CALLED TO, READ BACK BY AND VERIFIED WITH: Janeece Fitting PA ON 03/29/2018 BY PATRICK SWEENEY RRT, RCP AT 2200    Bicarbonate 33.3 (H) 20.0 - 28.0 mmol/L   Acid-Base Excess 4.0 (H) 0.0 - 2.0 mmol/L   O2 Saturation 38.1 %   Patient temperature 98.6    Collection site VEIN    Drawn by DRAWN BY RN    Sample type VENOUS     Comment: Performed at [facility], [place] [address] [place], [place] [address]   Ct Angio Chest Pe W And/or Wo Contrast  Result Date: 03/20/2018 CLINICAL DATA:  Shortness of breath EXAM: CT ANGIOGRAPHY CHEST  WITH CONTRAST TECHNIQUE: Multidetector CT imaging of the chest was performed using the standard protocol during bolus administration of intravenous contrast. Multiplanar CT image reconstructions and  MIPs were obtained to evaluate the vascular anatomy. CONTRAST:  148mL ISOVUE-370 IOPAMIDOL (ISOVUE-370) INJECTION 76% COMPARISON:  PET CT 04/08/2018, CT chest 03/17/2018, 12/04/2017, radiograph 04/10/2018 FINDINGS: Cardiovascular: Satisfactory opacification of the pulmonary arteries to the segmental level. No acute pulmonary embolus is visualized. Non enhancement of right upper lobe pulmonary arterial vessels consistent with vascular occlusion by large right upper lobe lung mass. Occlusion or tumor invasion of the superior vena cava as before. Interim development of extensive collateral vessels within the mediastinum, left chest wall, and paraspinal regions. Mild narrowing of the right main pulmonary artery by tumor. No acute filling defects within the enhancing portions of the right lower lobe pulmonary arterial vessels. No filling defects within the left-sided vasculature. Aortic atherosclerosis without aneurysmal dilatation. Enlarged pulmonary trunk up to 3.8 cm. Heart size within normal limits. No pericardial effusion Mediastinum/Nodes: Midline trachea. No thyroid mass. Generalized soft tissue density within the mediastinum, touching the aorta and narrowing the distal trachea and right bronchus. Soft tissue density in the subcarinal region may reflect nodes or additional tumor invasion of the mediastinum. Right upper and middle lobe bronchi are essentially occluded. Right lower lobe bronchus appears somewhat narrowed. Esophagus is within normal limits. Lungs/Pleura: Small right-sided pleural effusion. Large necrotic mass in the right upper lobe with surrounding obstructive atelectasis or pneumonia, appears progressed as compared with prior CT a chest 03/17/2018. Left upper lobe 11 mm pulmonary mass is again noted. Peribronchovascular nodularity in the right lower lobe, slightly diminished since interval PET CT. Upper Abdomen: No acute abnormality. Musculoskeletal: No acute or suspicious osseous lesion. Review of  the MIP images confirms the above findings. IMPRESSION: 1. No definite acute pulmonary embolus within the visible, patent pulmonary arteries. 2. Large necrotic right upper lobe lung mass consistent with history of lung cancer. Surrounding postobstructive atelectasis and or pneumonia in the right upper lobe and right middle lobe. Again noted is mediastinal invasion by mass lesion with marked narrowing of the distal trachea and carina and occlusion of the right upper and middle lobe bronchi. 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 Interim development of extensive collateral vessels within the mediastinum, left chest wall and paraspinal region, consistent with central venous obstruction. 3. Small right-sided pleural effusion. Stable left upper lobe pulmonary mass. Stable to slight decreased peribronchovascular nodularity in the right lower lobe Aortic Atherosclerosis (ICD10-I70.0). Electronically Signed   By: Donavan Foil M.D.   On: 03/23/2018 19:19   Dg Chest Portable 1 View  Result Date: 04/09/2018 CLINICAL DATA:  Shortness of breath. On radiation for lung mass. History of COPD. EXAM: PORTABLE CHEST 1 VIEW COMPARISON:  Chest radiograph April 13, 2018 FINDINGS: Stable appearance of large RIGHT lung consolidation/mass. Similar LEFT apical pleural thickening. No pleural effusion. No pneumothorax. Similar hyperinflation. Cardiac silhouette may be mildly enlarged, limited assessment due to consolidation. Soft tissue planes and included osseous structures are unchanged. Skin fold RIGHT chest. IMPRESSION: Stable appearance of RIGHT upper lobe consolidation/mass. Electronically Signed   By: Elon Alas M.D.   On: 03/26/2018 18:14   None  Pending Labs Unresulted Labs (From admission, onward)    Start     Ordered   04/16/18 0500  CBC  Tomorrow morning,   R     03/25/2018 2226   04/16/18 2725  Basic metabolic panel  Tomorrow morning,   R  04/04/2018 2226   04/17/2018 2145  Blood culture  (routine x 2)  BLOOD CULTURE X 2,   STAT     04/07/2018 2144          Vitals/Pain Today's Vitals   04/07/2018 2100 03/29/2018 2220 03/28/2018 2240 03/23/2018 2243  BP: (!) 87/54 131/85 109/71   Pulse: 99 (!) 115 (!) 133   Resp: (!) 28 (!) 23 (!) 26   Temp:      TempSrc:      SpO2: 99% 98% 96%   PainSc:    6     Isolation Precautions No active isolations  Medications Medications  sodium chloride (PF) 0.9 % injection (has no administration in time range)  iopamidol (ISOVUE-370) 76 % injection (has no administration in time range)  vancomycin (VANCOCIN) IVPB 1000 mg/200 mL premix (has no administration in time range)  0.9 %  sodium chloride infusion (has no administration in time range)  methylPREDNISolone sodium succinate (SOLU-MEDROL) 40 mg/mL injection 40 mg (has no administration in time range)  ipratropium-albuterol (DUONEB) 0.5-2.5 (3) MG/3ML nebulizer solution 3 mL (has no administration in time range)  ondansetron (ZOFRAN) injection 4 mg (4 mg Intravenous Given 04/12/2018 1653)  sodium chloride 0.9 % bolus 1,000 mL (0 mLs Intravenous Stopped 03/27/2018 1905)  iopamidol (ISOVUE-370) 76 % injection 100 mL (100 mLs Intravenous Contrast Given 03/22/2018 1816)  oxyCODONE-acetaminophen (PERCOCET/ROXICET) 5-325 MG per tablet 2 tablet (2 tablets Oral Given 03/30/2018 1921)  ceFEPIme (MAXIPIME) 2 g in sodium chloride 0.9 % 100 mL IVPB (2 g Intravenous New Bag/Given 04/13/2018 2227)    Mobility Walks normally, currently weak

## 2018-04-15 NOTE — Progress Notes (Signed)
A consult was received from an ED physician for Vancomycin per pharmacy dosing.  The patient's profile has been reviewed for ht/wt/allergies/indication/available labs.   A one time order has been placed for Vancomycin 1gm IV.  Further antibiotics/pharmacy consults should be ordered by admitting physician if indicated.                       Thank you, Biagio Borg 03/29/2018  9:52 PM

## 2018-04-15 NOTE — ED Notes (Signed)
Pt stated that she is feeling nauseous currently

## 2018-04-15 NOTE — Progress Notes (Signed)
Pharmacy Antibiotic Note  Sonya [name] is a 62 y.o. female admitted on 03/30/2018 with pneumonia.  Pharmacy has been consulted for zosyn and vancomyicn dosing.  Plan: Zosyn 3.375g IV q8h (4 hour infusion). Vancomycin 1 Gm x1 then 1250 mg IV q36h for est AUC = 472 Goal AUC = 400-500 F/u scr/cultures/levels Daily Scr    Temp (24hrs), Avg:97.4 F (36.3 C), Min:97.4 F (36.3 C), Max:97.4 F (36.3 C)  Recent Labs  Lab 04/10/18 0635 04/11/18 0535 04/12/18 0550 04/13/18 0606 04/07/2018 1603 04/12/2018 1650  WBC 15.1* 15.0* 14.0* 19.2* 19.6*  --   CREATININE 0.62 0.53 0.51 0.56  --  0.46    Estimated Creatinine Clearance: 57 mL/min (by C-G formula based on SCr of 0.46 mg/dL).    No Known Allergies  Antimicrobials this admission: 11/27 cefepime >> x1 ED 11/27 zosyn >>  11/27 vancomycin >>  Dose adjustments this admission:   Microbiology results:  BCx:   UCx:    Sputum:    MRSA PCR:  Thank you for allowing pharmacy to be a part of this patient's care.  Dorrene German 03/28/2018 11:06 PM

## 2018-04-16 ENCOUNTER — Other Ambulatory Visit: Payer: Self-pay

## 2018-04-16 ENCOUNTER — Telehealth: Payer: Self-pay | Admitting: Pulmonary Disease

## 2018-04-16 ENCOUNTER — Inpatient Hospital Stay (HOSPITAL_COMMUNITY): Payer: Medicaid Other

## 2018-04-16 DIAGNOSIS — Z7189 Other specified counseling: Secondary | ICD-10-CM

## 2018-04-16 DIAGNOSIS — R0602 Shortness of breath: Secondary | ICD-10-CM

## 2018-04-16 DIAGNOSIS — Z515 Encounter for palliative care: Secondary | ICD-10-CM

## 2018-04-16 LAB — CBC
HCT: 32.9 % — ABNORMAL LOW (ref 36.0–46.0)
HEMOGLOBIN: 10.1 g/dL — AB (ref 12.0–15.0)
MCH: 29.6 pg (ref 26.0–34.0)
MCHC: 30.7 g/dL (ref 30.0–36.0)
MCV: 96.5 fL (ref 80.0–100.0)
Platelets: 264 10*3/uL (ref 150–400)
RBC: 3.41 MIL/uL — ABNORMAL LOW (ref 3.87–5.11)
RDW: 15.7 % — ABNORMAL HIGH (ref 11.5–15.5)
WBC: 20.8 10*3/uL — ABNORMAL HIGH (ref 4.0–10.5)
nRBC: 0 % (ref 0.0–0.2)

## 2018-04-16 LAB — BASIC METABOLIC PANEL
Anion gap: 7 (ref 5–15)
BUN: 24 mg/dL — AB (ref 8–23)
CHLORIDE: 98 mmol/L (ref 98–111)
CO2: 29 mmol/L (ref 22–32)
CREATININE: 0.53 mg/dL (ref 0.44–1.00)
Calcium: 8.7 mg/dL — ABNORMAL LOW (ref 8.9–10.3)
GFR calc non Af Amer: >60 mL/min (ref >60)
Glucose, Bld: 125 mg/dL — ABNORMAL HIGH (ref 70–99)
POTASSIUM: 5.1 mmol/L (ref 3.5–5.1)
Sodium: 134 mmol/L — ABNORMAL LOW (ref 135–145)

## 2018-04-16 LAB — MRSA PCR SCREENING: MRSA by PCR: NEGATIVE

## 2018-04-16 MED ORDER — METOPROLOL TARTRATE 5 MG/5ML IV SOLN
2.5000 mg | INTRAVENOUS | Status: DC | PRN
  Administered 2018-04-16 (×3): 2.5 mg via INTRAVENOUS
  Filled 2018-04-16 (×4): qty 5

## 2018-04-16 MED ORDER — ORAL CARE MOUTH RINSE
15.0000 mL | Freq: Two times a day (BID) | OROMUCOSAL | Status: DC
  Administered 2018-04-17: 15 mL via OROMUCOSAL

## 2018-04-16 MED ORDER — VANCOMYCIN HCL 10 G IV SOLR: 1250.0000 mg | INTRAVENOUS | Status: DC

## 2018-04-16 MED ORDER — LEVALBUTEROL HCL 0.63 MG/3ML IN NEBU
0.6300 mg | INHALATION_SOLUTION | Freq: Four times a day (QID) | RESPIRATORY_TRACT | Status: DC
  Administered 2018-04-16 – 2018-04-18 (×6): 0.63 mg via RESPIRATORY_TRACT
  Filled 2018-04-16 (×9): qty 3

## 2018-04-16 MED ORDER — IPRATROPIUM BROMIDE 0.02 % IN SOLN
0.5000 mg | Freq: Four times a day (QID) | RESPIRATORY_TRACT | Status: DC
  Administered 2018-04-16 – 2018-04-18 (×6): 0.5 mg via RESPIRATORY_TRACT
  Filled 2018-04-16 (×9): qty 2.5

## 2018-04-16 NOTE — Progress Notes (Signed)
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  History of present illness   Sonya [name] is a 62 year old lady with history of squamous cell lung cancer of the right lung stage III at least with SVC syndrome, COPD on Eliquis for upper extremity thrombus coming in after 1 day of being discharged with massive hemoptysis which she describes as 1 cup.  Patient is a very poor historian not able to get much history from her but she stated that she has some shortness of breath denies any chest pain.  She has not had any more hemoptysis since she came into the emergency room as on 04/16/2018 at 10:30 AM  Patient is receiving fluids No family around to give any more history.  Patient was just 11/26 after being admitted for acute hypoxemic respiratory failure at that time where she was started on oxygen and was sent home on Levaquin.  Past Medical History  Atrial fibrillation ([place]), Bronchitis, Chronic back pain, COPD (chronic obstructive pulmonary disease) ([place]), HTN (hypertension), Lung cancer ([place]), Mass of upper lobe of right lung, and Scoliosis.  She was seen by Dr. Loanne Drilling, had bronchoscopy performed 03/27/2018 Had bronchoscopy performed revealing a diagnosis of cancer She has not followed up She also has not followed up with oncology  Was been seen by Dr. Domingo Cocking on 03/27/2018--palliative care  Attempts to get her to follow-up with oncology as documented-reviewed  Significant Hospital Events   Hemoptysis   Consults:  Palliative care 04/08/2018  Procedures:  None  Significant Diagnostic Tests:  CT scan of the chest-reviewed by myself  1. No definite acute pulmonary embolus  within the visible, patent pulmonary arteries. 2. Large necrotic right upper lobe lung mass consistent with history of lung cancer. Surrounding postobstructive atelectasis and or pneumonia in the right upper lobe and right middle lobe. Again noted is mediastinal invasion by mass lesion with marked narrowing of the distal trachea and carina and occlusion of the right upper and middle lobe bronchi. 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 Interim development of extensive collateral vessels within the mediastinum, left chest wall and paraspinal region, consistent with central venous obstruction. 3. Small right-sided pleural effusion. Stable left upper lobe pulmonary mass. Stable to slight decreased peribronchovascular nodularity in the right lower lobe  Micro Data:  none  Antimicrobials:  Zosyn 04/01/2018>> Vancomycin 04/17/2018>> Interim history/subjective:  Middle-aged lady, very frail, emaciated  Objective   Blood pressure (!) 124/53, pulse 80, temperature 97.6 F (36.4 C), temperature source Oral, resp. rate (!) 27, height 5\' 9"  (1.753 m), weight 44.9 kg, SpO2 98 %.        Intake/Output Summary (Last 24 hours) at 04/16/2018 1026 Last data filed at 04/16/2018 0900 Gross per 24 hour  Intake 1482.27 ml  Output -  Net 1482.27 ml   Filed Weights   04/16/18 0001 04/16/18 0340  Weight: 44.9 kg 44.9 kg    Examination: General: Middle-aged lady, emaciated HENT: Moist oral mucosa Lungs: Rhonchi bilaterally, rales at the bases Cardiovascular: S1-S2 appreciated Abdomen: Bowel sounds appreciated, soft, nontender Extremities: No edema, positive for clubbing Neuro: Awake, moving all extremities No skin rash  Resolved Hospital Problem list     Assessment & Plan:  Massive hemoptysis -  She is not bringing up any blood at the present time -Source of bleeding is likely endotracheal and endobronchial lesions -There is significant narrowing of the airway even on  previous CTs -Risk of decompensation is very high  Squamous cell lung cancer with SVC syndrome -Appears to be very advanced disease at present -From records he appears that there may be issues with compliance with follow-ups  Postobstructive pneumonia -We will continue antibiotics at the present time -Follow cultures  Acute hypoxemic respiratory failure -Continue oxygen supplementation -  Sepsis -We will continue antibiotics -Close monitoring  Malnutrition -Secondary to advanced cancer  Electrolyte derangement-hyponatremia  She is currently receiving radiation treatments Progressive disease  Palliative measures/palliative care is probably the best course of management  Best practice:  Diet: As tolerated Pain/Anxiety/Delirium protocol (if indicated):  DVT prophylaxis: Holding anticoagulation secondary to significant hemoptysis Mobility: Bedrest Code Status: DNR Family Communication: No family at beds Disposition:   Labs   CBC: Recent Labs  Lab 04/11/18 0535 04/12/18 0550 04/13/18 0606 03/29/2018 1603 04/16/18 0342  WBC 15.0* 14.0* 19.2* 19.6* 20.8*  NEUTROABS 13.8* 12.8* 16.8* 18.6*  --   HGB 10.0* 10.1* 10.8* 10.5* 10.1*  HCT 31.3* 31.2* 34.8* 33.4* 32.9*  MCV 91.8 93.1 93.5 92.8 96.5  PLT 376 380 384 229 562    Basic Metabolic Panel: Recent Labs  Lab 04/11/18 0535 04/12/18 0550 04/13/18 0606 04/06/2018 1650 04/16/18 0342  NA 131* 134* 133* 132* 134*  K 4.9 4.9 4.8 5.0 5.1  CL 95* 98 94* 92* 98  CO2 27 28 31  36* 29  GLUCOSE 150* 130* 102* 123* 125*  BUN 19 19 20  27* 24*  CREATININE 0.53 0.51 0.56 0.46 0.53  CALCIUM 9.7 9.4 9.8 9.2 8.7*  MG 2.3 2.2  --   --   --   PHOS 2.7 2.8  --   --   --    GFR: Estimated Creatinine Clearance: 51.7 mL/min (by C-G formula based on SCr of 0.53 mg/dL). Recent Labs  Lab 04/12/18 0550 04/13/18 0606 04/10/2018 1603 04/16/18 0342  WBC 14.0* 19.2* 19.6* 20.8*    Liver Function Tests: Recent Labs  Lab  04/10/18 0635 04/11/18 0535 04/12/18 0550 03/27/2018 1650  AST 13* 14* 25 12*  ALT 11 13 22 18   ALKPHOS 51 62 61 59  BILITOT 0.3 0.4 0.4 0.7  PROT 6.9 7.5 7.1 7.1  ALBUMIN 2.5* 2.9* 2.7* 2.8*   No results for input(s): LIPASE, AMYLASE in the last 168 hours. No results for input(s): AMMONIA in the last 168 hours.  ABG    Component Value Date/Time   HCO3 33.3 (H) 03/28/2018 2149   TCO2 26 06/13/2015 1600   O2SAT 38.1 03/28/2018 2149     Coagulation Profile: Recent Labs  Lab 04/01/2018 1603  INR 1.69    Cardiac Enzymes: No results for input(s): CKTOTAL, CKMB, CKMBINDEX, TROPONINI in the last 168 hours.  HbA1C: No results found for: HGBA1C  CBG: Recent Labs  Lab 04/13/18 1625 04/13/18 2100 04/14/18 0731 04/14/18 1132 04/14/18 1652  GLUCAP 123* 120* 106* 118* 169*    Review of Systems:   Review of Systems  Constitutional: Negative.   HENT: Negative.   Eyes: Negative.   Respiratory: Positive for cough and shortness of breath.   Cardiovascular: Negative for chest pain.  Gastrointestinal: Negative.   Genitourinary: Negative.   Skin: Negative.   All other systems reviewed and are negative.    Past Medical History  She,  has a past medical history  of Atrial fibrillation ([place]), Bronchitis, Chronic back pain, COPD (chronic obstructive pulmonary disease) ([place]), HTN (hypertension), Lung cancer ([place]), Mass of upper lobe of right lung, and Scoliosis.   Surgical History    Past Surgical History:  Procedure Laterality Date  . ENDOBRONCHIAL ULTRASOUND Bilateral 03/26/2018   Procedure: ENDOBRONCHIAL ULTRASOUND;  Surgeon: Margaretha Seeds, MD;  Location: [facility] ENDOSCOPY;  Service: Pulmonary;  Laterality: Bilateral;     Social History   reports that she quit smoking about 6 weeks ago. Her smoking use included cigarettes. She has a 10.00 pack-year smoking history. She has never used smokeless tobacco. She reports that she drank alcohol. She reports that she does not use  drugs.   Family History   Her family history includes Cancer in her brother, brother, and sister; Heart disease in her brother.   Allergies No Known Allergies   Home Medications  Prior to Admission medications   Medication Sig Start Date End Date Taking? Authorizing Provider  albuterol (PROVENTIL HFA;VENTOLIN HFA) 108 (90 Base) MCG/ACT inhaler Inhale 2 puffs into the lungs every 6 (six) hours as needed for wheezing or shortness of breath. 04/02/18  Yes Scot Jun, FNP  apixaban (ELIQUIS) 5 MG TABS tablet Take 1 tablet (5 mg total) by mouth 2 (two) times daily. 04/03/18  Yes Charlynne Cousins, MD  feeding supplement, ENSURE ENLIVE, (ENSURE ENLIVE) LIQD Take 237 mLs by mouth 2 (two) times daily between meals. 03/28/18  Yes Charlynne Cousins, MD  oxyCODONE-acetaminophen (PERCOCET/ROXICET) 5-325 MG tablet Take 1 tablet by mouth every 8 (eight) hours as needed for severe pain. Patient taking differently: Take 2 tablets by mouth every 8 (eight) hours as needed for severe pain.  12/04/17  Yes McDonald, Mia A, PA-C  albuterol (PROVENTIL) (2.5 MG/3ML) 0.083% nebulizer solution Take 3 mLs (2.5 mg total) by nebulization every 6 (six) hours as needed for wheezing or shortness of breath. 04/02/18   Scot Jun, FNP  diltiazem (CARDIZEM CD) 240 MG 24 hr capsule Take 1 capsule (240 mg total) by mouth daily. 04/14/18   Mikhail, Velta Addison, DO  ipratropium (ATROVENT) 0.02 % nebulizer solution Take 2.5 mLs (0.5 mg total) by nebulization 4 (four) times daily. 04/02/18   Scot Jun, FNP  levofloxacin (LEVAQUIN) 500 MG tablet Take 1 tablet (500 mg total) by mouth daily for 5 days. 04/13/18 2018/04/19  Mikhail, Velta Addison, DO  ondansetron (ZOFRAN) 4 MG tablet Take 1 tablet (4 mg total) by mouth every 6 (six) hours as needed for nausea. 04/13/18   Mikhail, Velta Addison, DO  predniSONE (DELTASONE) 20 MG tablet Take in the morning. Take 3 tabs x 3 days, then 2 tabs x 3 days, then 1 tab x 3 days. 04/13/18    Cristal Ford, DO     Critical care time: 30 minutes of critical care time spent evaluating records, spoke with palliative care, spoke with oncology

## 2018-04-16 NOTE — Consult Note (Signed)
Consultation Note Date: 04/16/2018   Patient Name: Sonya [name]  DOB: 30-Nov-1955  MRN: [id number]  Age / Sex: 62 y.o., female  PCP: Scot Jun, FNP Referring Physician: Aldean Jewett, MD  Reason for Consultation: Establishing goals of care  HPI/Patient Profile: 62 y.o. female admitted on 04/14/2018     Clinical Assessment and Goals of Care:  62 year old lady with a history of squamous cell lung cancer, history of SVC syndrome, history of chronic obstructive pulmonary disease, history of thrombus for which she was on anticoagulation, recent hospitalization, readmitted with hemoptysis and shortness of breath. Patient has been admitted to stepdown unit at [facility] for acute hypoxic respiratory failure, postobstructive pneumonia with underlying life limiting illness of lung cancer. Patient was seen and evaluated by palliative services and previous hospitalization. It is reported that the patient, according to chart review, is estranged from her husband also has 4 adult children whose names are contact information is not noted on the chart. She has a niece and nephew listed as next of kin on her chart.  Patient remains admitted to stepdown unit she is on 5 L of oxygen, she is on antibiotics. She is currently receiving radiation treatments. She appears to have advanced disease.  Palliative consultation was requested for ongoing goals of care discussions.  Patient is awake alert sitting up by the edge of the bed. She is oriented however she is a little high date. She does not discuss much. She keeps on repeating to me that she would like to go home. She kept on removing her supplemental oxygen, however with encouragement, was able to put it back on. She denied feeling short of breath. She stated that she was aware of the fact that she had cancer and that she was not getting better.  Call  placed and unable to reach niece. Call placed and discussed briefly with nephew who stated that the patient was with him in his house when she developed the shortness of breath and hemoptysis and was hence transferred to the hospital. We discussed about the advanced nature of the patient's illness. Nephew states he is aware.  At this time, patient unable to fully participate or discuss in goals wishes and values discussions. Please note additional recommendations as listed below. Thank you for the consult.    SUMMARY OF RECOMMENDATIONS   Agree with DNR Continue current mode of care, monitor disease trajectory.  Patient will likely benefit from SNF rehab attempt and having palliative services follow her over there, once she has completed radiation, she may also benefit from addition of hospice as an extra layer of support, would recommend ongoing med onc follow up and recommendations.   Code Status/Advance Care Planning:  DNR    Symptom Management:    as above   Palliative Prophylaxis:   Delirium Protocol   Psycho-social/Spiritual:   Desire for further Chaplaincy support:yes  Additional Recommendations: Caregiving  Support/Resources  Prognosis:   Unable to determine  Discharge Planning: [place] for rehab with Palliative care  service follow-up      Primary Diagnoses: Present on Admission: . Hemoptysis   I have reviewed the medical record, interviewed the patient and family, and examined the patient. The following aspects are pertinent.  Past Medical History:  Diagnosis Date  . Atrial fibrillation ([place])   . Bronchitis   . Chronic back pain   . COPD (chronic obstructive pulmonary disease) ([place])   . HTN (hypertension)   . Lung cancer ([place])   . Mass of upper lobe of right lung   . Scoliosis    Social History   Socioeconomic History  . Marital status: Divorced    Spouse name: Not on file  . Number of children: Not on file  . Years of education:  Not on file  . Highest education level: Not on file  Occupational History  . Not on file  Social Needs  . Financial resource strain: Not on file  . Food insecurity:    Worry: Not on file    Inability: Not on file  . Transportation needs:    Medical: Not on file    Non-medical: Not on file  Tobacco Use  . Smoking status: Former Smoker    Packs/day: 0.25    Years: 40.00    Pack years: 10.00    Types: Cigarettes    Last attempt to quit: 03/03/2018    Years since quitting: 0.1  . Smokeless tobacco: Never Used  Substance and Sexual Activity  . Alcohol use: Not Currently  . Drug use: No  . Sexual activity: Not Currently  Lifestyle  . Physical activity:    Days per week: Not on file    Minutes per session: Not on file  . Stress: Not on file  Relationships  . Social connections:    Talks on phone: Not on file    Gets together: Not on file    Attends religious service: Not on file    Active member of club or organization: Not on file    Attends meetings of clubs or organizations: Not on file    Relationship status: Not on file  Other Topics Concern  . Not on file  Social History Narrative  . Not on file   Family History  Problem Relation Age of Onset  . Heart disease Brother   . Cancer Brother   . Cancer Sister   . Cancer Brother    Scheduled Meds: . ipratropium  0.5 mg Nebulization Q6H  . levalbuterol  0.63 mg Nebulization Q6H  . mouth rinse  15 mL Mouth Rinse BID  . methylPREDNISolone (SOLU-MEDROL) injection  40 mg Intravenous Q6H   Continuous Infusions: . sodium chloride 50 mL/hr (04/16/18 0908)  . piperacillin-tazobactam (ZOSYN)  IV Stopped (04/16/18 0754)   PRN Meds:.metoprolol tartrate Medications Prior to Admission:  Prior to Admission medications   Medication Sig Start Date End Date Taking? Authorizing Provider  albuterol (PROVENTIL HFA;VENTOLIN HFA) 108 (90 Base) MCG/ACT inhaler Inhale 2 puffs into the lungs every 6 (six) hours as needed for wheezing or  shortness of breath. 04/02/18  Yes Scot Jun, FNP  apixaban (ELIQUIS) 5 MG TABS tablet Take 1 tablet (5 mg total) by mouth 2 (two) times daily. 04/03/18  Yes Charlynne Cousins, MD  feeding supplement, ENSURE ENLIVE, (ENSURE ENLIVE) LIQD Take 237 mLs by mouth 2 (two) times daily between meals. 03/28/18  Yes Charlynne Cousins, MD  oxyCODONE-acetaminophen (PERCOCET/ROXICET) 5-325 MG tablet Take 1 tablet by mouth every 8 (eight) hours as needed for  severe pain. Patient taking differently: Take 2 tablets by mouth every 8 (eight) hours as needed for severe pain.  12/04/17  Yes McDonald, Mia A, PA-C  albuterol (PROVENTIL) (2.5 MG/3ML) 0.083% nebulizer solution Take 3 mLs (2.5 mg total) by nebulization every 6 (six) hours as needed for wheezing or shortness of breath. 04/02/18   Scot Jun, FNP  diltiazem (CARDIZEM CD) 240 MG 24 hr capsule Take 1 capsule (240 mg total) by mouth daily. 04/14/18   Mikhail, Velta Addison, DO  ipratropium (ATROVENT) 0.02 % nebulizer solution Take 2.5 mLs (0.5 mg total) by nebulization 4 (four) times daily. 04/02/18   Scot Jun, FNP  levofloxacin (LEVAQUIN) 500 MG tablet Take 1 tablet (500 mg total) by mouth daily for 5 days. 04/13/18 2018/05/17  Mikhail, Velta Addison, DO  ondansetron (ZOFRAN) 4 MG tablet Take 1 tablet (4 mg total) by mouth every 6 (six) hours as needed for nausea. 04/13/18   Mikhail, Velta Addison, DO  predniSONE (DELTASONE) 20 MG tablet Take in the morning. Take 3 tabs x 3 days, then 2 tabs x 3 days, then 1 tab x 3 days. 04/13/18   Cristal Ford, DO   No Known Allergies Review of Systems Appears mildly distressed  Physical Exam Awake alert In mild distress Has muscle wasting Coarse rhonchi S 1 S 2  Abdomen is soft Trace edema Has clubbing+  Vital Signs: BP (!) 124/53 (BP Location: Right Arm)   Pulse 80   Temp 97.6 F (36.4 C) (Oral)   Resp (!) 27   Ht 5\' 9"  (1.753 m)   Wt 44.9 kg   SpO2 90%   BMI 14.62 kg/m  Pain Scale: 0-10     Pain Score: 0-No pain   SpO2: SpO2: 90 % O2 Device:SpO2: 90 % O2 Flow Rate: .O2 Flow Rate (L/min): 5 L/min  IO: Intake/output summary:   Intake/Output Summary (Last 24 hours) at 04/16/2018 1345 Last data filed at 04/16/2018 0900 Gross per 24 hour  Intake 1482.27 ml  Output -  Net 1482.27 ml    LBM:   Baseline Weight: Weight: 44.9 kg Most recent weight: Weight: 44.9 kg     Palliative Assessment/Data:   PPS 40%  Time In:  10 Time Out:  11 Time Total:  60 min  Greater than 50%  of this time was spent counseling and coordinating care related to the above assessment and plan.  Signed by: Loistine Chance, MD  [phone] Please contact Palliative Medicine Team phone at [phone] for questions and concerns.  For individual provider: See Shea Evans

## 2018-04-16 NOTE — Progress Notes (Signed)
eLink Physician-Brief Progress Note Patient Name: Sonya [name] DOB: 1956-04-25 MRN: [id number]   Date of Service  04/16/2018  HPI/Events of Note  62 y/o F Squamous cell lung CA s/p radiation for SVC syndrome, chronic afib, presented with hemoptysis of about 1 cup.  Chest CT with possible post obstructive process. Episode of hypotension.  eICU Interventions   ICU monitoring for recurrence of massive hemoptysis  Antibiotics for pneumonia  Fluids for resuscitation  Type and cross just in case transfusion needed     Intervention Category Major Interventions: Arrhythmia - evaluation and management;Hemorrhage - evaluation and management;Hypotension - evaluation and management Evaluation Type: New Patient Evaluation  Judd Lien 04/16/2018, 2:07 AM

## 2018-04-17 ENCOUNTER — Encounter: Payer: Self-pay | Admitting: *Deleted

## 2018-04-17 ENCOUNTER — Inpatient Hospital Stay (HOSPITAL_COMMUNITY): Payer: Medicaid Other

## 2018-04-17 DIAGNOSIS — R0602 Shortness of breath: Secondary | ICD-10-CM

## 2018-04-17 MED ORDER — DILTIAZEM HCL-DEXTROSE 100-5 MG/100ML-% IV SOLN (PREMIX)
5.0000 mg/h | INTRAVENOUS | Status: DC
  Administered 2018-04-17: 5 mg/h via INTRAVENOUS
  Administered 2018-04-17: 15 mg/h via INTRAVENOUS
  Administered 2018-04-17: 5 mg/h via INTRAVENOUS
  Filled 2018-04-17 (×2): qty 100

## 2018-04-17 MED ORDER — DILTIAZEM HCL ER COATED BEADS 120 MG PO CP24
240.0000 mg | ORAL_CAPSULE | Freq: Every day | ORAL | Status: DC
  Filled 2018-04-17 (×2): qty 2

## 2018-04-17 MED ORDER — DILTIAZEM HCL 30 MG PO TABS
30.0000 mg | ORAL_TABLET | Freq: Three times a day (TID) | ORAL | Status: DC
  Administered 2018-04-17 – 2018-04-18 (×4): 30 mg via ORAL
  Filled 2018-04-17 (×4): qty 1

## 2018-04-17 NOTE — Progress Notes (Signed)
Pharmacy Antibiotic Note  Sonya [name] is a 62 y.o. female admitted on 03/27/2018 with pneumonia.  Pharmacy has been consulted for zosyn and vancomyicn dosing.  Plan: 1) Day 2 Abxs 2) Continue current Zosyn dosing  Height: 5\' 9"  (175.3 cm) Weight: 98 lb 12.3 oz (44.8 kg) IBW/kg (Calculated) : 66.2  Temp (24hrs), Avg:97.9 F (36.6 C), Min:97.5 F (36.4 C), Max:98.9 F (37.2 C)  Recent Labs  Lab 04/11/18 0535 04/12/18 0550 04/13/18 0606 03/28/2018 1603 04/08/2018 1650 04/16/18 0342  WBC 15.0* 14.0* 19.2* 19.6*  --  20.8*  CREATININE 0.53 0.51 0.56  --  0.46 0.53    Estimated Creatinine Clearance: 51.6 mL/min (by C-G formula based on SCr of 0.53 mg/dL).    No Known Allergies  Antimicrobials this admission: 11/27 cefepime >> x1 ED 11/27 zosyn >>  11/27 vancomycin >> 11/28  Dose adjustments this admission:   Microbiology results: 11/27 blood cx: ngtd 11/27 MRSA PCR: negative  Thank you for allowing pharmacy to be a part of this patient's care.   Adrian Saran, PharmD, BCPS Pager [phone] 04/17/2018 9:49 AM

## 2018-04-17 NOTE — Progress Notes (Signed)
[facility] Progress Note Patient Name: Marti Mclane DOB: [birth year] MRN: [id number]   Date of Service  04/17/2018  HPI/Events of Note  Patient with h/o of AF on cardizem CD as home med.  Now with reoccurance of AF/RVR had not been on her home meds.  BP stable.  Attempted to give oral meds but could not take.  eICU Interventions  Plan: Started on diltizem gtt for HR control     Intervention Category Intermediate Interventions: Arrhythmia - evaluation and management  Lynniah Janoski 04/17/2018, 3:05 AM

## 2018-04-17 NOTE — Progress Notes (Signed)
Oncology Nurse Navigator Documentation  Oncology Nurse Navigator Flowsheets 04/17/2018  Navigator Location [facility]  Navigator Encounter Type Other/I updated Dr. Julien Nordmann that patient is in the hospital.   Treatment Phase Pre-Tx/Tx Discussion  Barriers/Navigation Needs Coordination of Care  Interventions Coordination of Care  Coordination of Care Other  Acuity Level 2  Time Spent with Patient 30

## 2018-04-17 NOTE — Progress Notes (Signed)
COnsulted by Dr Ander Slade for input regarding Sonya [name] . Patients chart reviewed and patient was interviewed and examined.  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 Post-op pneumonia PLAN -following with Dr Tammi Klippel for continue Palliative radiation -patient was to be seen by Dr Earlie Server. Will inform lung navigator to reschedule outpatient appointment and see if patient can be seen in the hospital. -there was a plan to consider concurrent chemotherapy if patient stable -Abx and other ICU mx per critical care team. -palliative care input for ongoing goals of care discussion.  [name]

## 2018-04-17 NOTE — Progress Notes (Signed)
Pt became very angry when I tried to give her solumedrol.  She started swinging her hand at me and cursing.  She refused medication.  Pt encouraged to rest and educated on importance of medication.  Will monitor pt closely.

## 2018-04-17 NOTE — Progress Notes (Addendum)
Notified E-link RN at [facility] that patient's HR is sustaining 130-145 bpm, at times elevated as high as 150 bpm. I made the [facility] RN aware that patient has received PRN Lopressor IV push and that it did not have a lasting effect on maintaining the patient's HR below 130 bpm. After reviewing the patient's chart I made the [facility] RN aware that the patient takes Cardizem po at home. Elink RN to communicate vital signs to Dr. Jimmy Footman. Elink RN requested that I complete a 12 lead EKG, EKG completed. I notified primary RN, Clementeen Graham., of the conversation I had with the [facility] RN. Will continue to monitor.   Update: MD ordered Cardizem po to be given. Patient swallowed applesauce well, but continued to chew up po Cardizem and spit it back out. I notified Dr. Jimmy Footman. MD stated she was going to order a Cardizem gtt. I updated primary RN Priscille Heidelberg R., will continue to monitor.

## 2018-04-17 NOTE — Progress Notes (Signed)
NAME:  Sonya [name], MRN:  [id number], DOB:  [birth year], LOS: 2 ADMISSION DATE:  03/26/2018, CONSULTATION DATE: 04/14/2018 REFERRING MD: Emergency room physician, CHIEF COMPLAINT: Hemoptysis  Brief History   Stage III squamous cell lung cancer with SVC syndrome and severe tracheal narrowing secondary to the mass coming in with massive hemoptysis.   History of present illness   Sonya [name] is a 62 year old lady with history of squamous cell lung cancer of the right lung stage III at least with SVC syndrome, COPD on Eliquis for upper extremity thrombus coming in after 1 day of being discharged with massive hemoptysis which she describes as 1 cup.  Patient is a very poor historian not able to get much history from her but she stated that she has some shortness of breath denies any chest pain.  She has not had any more hemoptysis since she came into the emergency room as on 04/16/2018 at 10:30 AM  Patient is receiving fluids No family around to give any more history.  Patient was just 11/26 after being admitted for acute hypoxemic respiratory failure at that time where she was started on oxygen and was sent home on Levaquin.  11/29-no recurrence of hemoptysis, remains short of breath  Past Medical History  Atrial fibrillation ([place]), Bronchitis, Chronic back pain, COPD (chronic obstructive pulmonary disease) ([place]), HTN (hypertension), Lung cancer ([place]), Mass of upper lobe of right lung, and Scoliosis.  She was seen by Dr. Loanne Drilling, had bronchoscopy performed 03/27/2018 Had bronchoscopy performed revealing a diagnosis of cancer She has not followed up She also has not followed up with oncology  Was been seen by Dr. Domingo Cocking on 03/27/2018--palliative care  Attempts to get her to follow-up with oncology as documented-reviewed  Significant Hospital Events   Hemoptysis   Consults:  Palliative care 04/08/2018 Oncology 04/16/18 Procedures:  None  Significant Diagnostic Tests:  CT  scan of the chest-reviewed by myself  1. No definite acute pulmonary embolus within the visible, patent pulmonary arteries. 2. Large necrotic right upper lobe lung mass consistent with history of lung cancer. Surrounding postobstructive atelectasis and or pneumonia in the right upper lobe and right middle lobe. Again noted is mediastinal invasion by mass lesion with marked narrowing of the distal trachea and carina and occlusion of the right upper and middle lobe bronchi. 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 Interim development of extensive collateral vessels within the mediastinum, left chest wall and paraspinal region, consistent with central venous obstruction. 3. Small right-sided pleural effusion. Stable left upper lobe pulmonary mass. Stable to slight decreased peribronchovascular nodularity in the right lower lobe  Micro Data:  none  Antimicrobials:  Zosyn 03/31/2018>> Vancomycin 03/22/2018>>11/28 Interim history/subjective:  Middle-aged lady, very frail, emaciated Not very interactive today  Objective   Blood pressure 120/61, pulse 72, temperature 97.6 F (36.4 C), temperature source Oral, resp. rate (!) 26, height 5\' 9"  (1.753 m), weight 44.8 kg, SpO2 98 %.        Intake/Output Summary (Last 24 hours) at 04/17/2018 1220 Last data filed at 04/17/2018 1200 Gross per 24 hour  Intake 1046.09 ml  Output -  Net 1046.09 ml   Filed Weights   04/16/18 0001 04/16/18 0340 04/17/18 0452  Weight: 44.9 kg 44.9 kg 44.8 kg    Examination: General: Middle-aged lady HENT: Moist oral mucosa Lungs: Rhonchi bilaterally, few rales at the bases Cardiovascular: S1-S2 appreciated Abdomen: Bowel sounds appreciated Extremities: Finger clubbing Neuro: Moving all extremities  Resolved Hospital Problem list  Assessment & Plan:  Massive hemoptysis -Hemoptysis appears to have stabilized -Source of bleeding is likely endotracheal and endobronchial  lesions -Significant narrowing of the airways noted on CT scan -Significant risk of decompensation   Squamous cell lung cancer with SVC syndrome -Very advanced disease -Was receiving radiation treatments -History of noncompliance   Postobstructive pneumonia -Continue current antibiotics-on Zosyn at present  Acute hypoxemic respiratory failure -Continue oxygen supplementation -  Sepsis -We will continue antibiotics -Close monitoring  Malnutrition -Secondary to advanced cancer -Taking orally as tolerated  Electrolyte derangement-hyponatremia Continue steroids  She is currently receiving radiation treatments Progressive disease  Palliative measures/palliative care is probably the best course of management  Patient is DNR risk of decompensation is very significant  Best practice:  Diet: As tolerated Pain/Anxiety/Delirium protocol (if indicated):   DVT prophylaxis: Continue to hold onto coagulants Mobility: Bedrest Code Status: DNR Family Communication: No family at beds Disposition:   Labs   CBC: Recent Labs  Lab 04/11/18 0535 04/12/18 0550 04/13/18 0606 04/07/2018 1603 04/16/18 0342  WBC 15.0* 14.0* 19.2* 19.6* 20.8*  NEUTROABS 13.8* 12.8* 16.8* 18.6*  --   HGB 10.0* 10.1* 10.8* 10.5* 10.1*  HCT 31.3* 31.2* 34.8* 33.4* 32.9*  MCV 91.8 93.1 93.5 92.8 96.5  PLT 376 380 384 229 119    Basic Metabolic Panel: Recent Labs  Lab 04/11/18 0535 04/12/18 0550 04/13/18 0606 04/03/2018 1650 04/16/18 0342  NA 131* 134* 133* 132* 134*  K 4.9 4.9 4.8 5.0 5.1  CL 95* 98 94* 92* 98  CO2 27 28 31  36* 29  GLUCOSE 150* 130* 102* 123* 125*  BUN 19 19 20  27* 24*  CREATININE 0.53 0.51 0.56 0.46 0.53  CALCIUM 9.7 9.4 9.8 9.2 8.7*  MG 2.3 2.2  --   --   --   PHOS 2.7 2.8  --   --   --    GFR: Estimated Creatinine Clearance: 51.6 mL/min (by C-G formula based on SCr of 0.53 mg/dL). Recent Labs  Lab 04/12/18 0550 04/13/18 0606 03/31/2018 1603 04/16/18 0342  WBC  14.0* 19.2* 19.6* 20.8*    Liver Function Tests: Recent Labs  Lab 04/11/18 0535 04/12/18 0550 04/14/2018 1650  AST 14* 25 12*  ALT 13 22 18   ALKPHOS 62 61 59  BILITOT 0.4 0.4 0.7  PROT 7.5 7.1 7.1  ALBUMIN 2.9* 2.7* 2.8*   No results for input(s): LIPASE, AMYLASE in the last 168 hours. No results for input(s): AMMONIA in the last 168 hours.  ABG    Component Value Date/Time   HCO3 33.3 (H) 03/23/2018 2149   TCO2 26 06/13/2015 1600   O2SAT 38.1 03/30/2018 2149     Coagulation Profile: Recent Labs  Lab 03/30/2018 1603  INR 1.69    Cardiac Enzymes: No results for input(s): CKTOTAL, CKMB, CKMBINDEX, TROPONINI in the last 168 hours.  HbA1C: No results found for: HGBA1C  CBG: Recent Labs  Lab 04/13/18 1625 04/13/18 2100 04/14/18 0731 04/14/18 1132 04/14/18 1652  GLUCAP 123* 120* 106* 118* 169*    Review of Systems:   Review of Systems  Constitutional: Negative.   HENT: Negative.   Eyes: Negative.   Respiratory: Positive for cough and shortness of breath.   Cardiovascular: Negative for chest pain.  Gastrointestinal: Negative.   Genitourinary: Negative.   Skin: Negative.   All other systems reviewed and are negative.    Past Medical History  She,  has a past medical history of Atrial fibrillation ([place]), Bronchitis, Chronic back pain, COPD (  chronic obstructive pulmonary disease) ([place]), HTN (hypertension), Lung cancer ([place]), Mass of upper lobe of right lung, and Scoliosis.   Surgical History    Past Surgical History:  Procedure Laterality Date  . ENDOBRONCHIAL ULTRASOUND Bilateral 03/26/2018   Procedure: ENDOBRONCHIAL ULTRASOUND;  Surgeon: Margaretha Seeds, MD;  Location: [facility] ENDOSCOPY;  Service: Pulmonary;  Laterality: Bilateral;     Social History   reports that she quit smoking about 6 weeks ago. Her smoking use included cigarettes. She has a 10.00 pack-year smoking history. She has never used smokeless tobacco. She reports that she drank alcohol. She  reports that she does not use drugs.   Family History   Her family history includes Cancer in her brother, brother, and sister; Heart disease in her brother.   Allergies No Known Allergies   Home Medications  Prior to Admission medications   Medication Sig Start Date End Date Taking? Authorizing Provider  albuterol (PROVENTIL HFA;VENTOLIN HFA) 108 (90 Base) MCG/ACT inhaler Inhale 2 puffs into the lungs every 6 (six) hours as needed for wheezing or shortness of breath. 04/02/18  Yes Scot Jun, FNP  apixaban (ELIQUIS) 5 MG TABS tablet Take 1 tablet (5 mg total) by mouth 2 (two) times daily. 04/03/18  Yes Charlynne Cousins, MD  feeding supplement, ENSURE ENLIVE, (ENSURE ENLIVE) LIQD Take 237 mLs by mouth 2 (two) times daily between meals. 03/28/18  Yes Charlynne Cousins, MD  oxyCODONE-acetaminophen (PERCOCET/ROXICET) 5-325 MG tablet Take 1 tablet by mouth every 8 (eight) hours as needed for severe pain. Patient taking differently: Take 2 tablets by mouth every 8 (eight) hours as needed for severe pain.  12/04/17  Yes McDonald, Mia A, PA-C  albuterol (PROVENTIL) (2.5 MG/3ML) 0.083% nebulizer solution Take 3 mLs (2.5 mg total) by nebulization every 6 (six) hours as needed for wheezing or shortness of breath. 04/02/18   Scot Jun, FNP  diltiazem (CARDIZEM CD) 240 MG 24 hr capsule Take 1 capsule (240 mg total) by mouth daily. 04/14/18   Mikhail, Velta Addison, DO  ipratropium (ATROVENT) 0.02 % nebulizer solution Take 2.5 mLs (0.5 mg total) by nebulization 4 (four) times daily. 04/02/18   Scot Jun, FNP  levofloxacin (LEVAQUIN) 500 MG tablet Take 1 tablet (500 mg total) by mouth daily for 5 days. 04/13/18 2018/04/22  Mikhail, Velta Addison, DO  ondansetron (ZOFRAN) 4 MG tablet Take 1 tablet (4 mg total) by mouth every 6 (six) hours as needed for nausea. 04/13/18   Mikhail, Velta Addison, DO  predniSONE (DELTASONE) 20 MG tablet Take in the morning. Take 3 tabs x 3 days, then 2 tabs x 3 days,  then 1 tab x 3 days. 04/13/18   Cristal Ford, DO

## 2018-04-18 DIAGNOSIS — Z7189 Other specified counseling: Secondary | ICD-10-CM

## 2018-04-18 MED ORDER — LORAZEPAM 2 MG/ML IJ SOLN
0.5000 mg | INTRAMUSCULAR | Status: DC | PRN
  Administered 2018-04-18: 0.5 mg via INTRAVENOUS
  Filled 2018-04-18: qty 1

## 2018-04-18 MED ORDER — MORPHINE BOLUS VIA INFUSION
2.0000 mg | INTRAVENOUS | Status: DC | PRN
  Filled 2018-04-18: qty 2

## 2018-04-18 MED ORDER — METOPROLOL TARTRATE 5 MG/5ML IV SOLN
5.0000 mg | Freq: Four times a day (QID) | INTRAVENOUS | Status: DC
  Administered 2018-04-18: 5 mg via INTRAVENOUS

## 2018-04-18 MED ORDER — MORPHINE 100MG IN NS 100ML (1MG/ML) PREMIX INFUSION
2.0000 mg/h | INTRAVENOUS | Status: DC
  Administered 2018-04-18: 2 mg/h via INTRAVENOUS
  Filled 2018-04-18: qty 100

## 2018-04-18 MED ORDER — MORPHINE SULFATE (PF) 2 MG/ML IV SOLN
2.0000 mg | INTRAVENOUS | Status: DC | PRN
  Administered 2018-04-18: 2 mg via INTRAVENOUS
  Filled 2018-04-18: qty 1

## 2018-04-18 MED ORDER — GLYCOPYRROLATE 0.2 MG/ML IJ SOLN
0.4000 mg | Freq: Four times a day (QID) | INTRAMUSCULAR | Status: DC | PRN
  Administered 2018-04-18: 0.4 mg via INTRAVENOUS
  Filled 2018-04-18: qty 2

## 2018-04-18 MED ORDER — LORAZEPAM 2 MG/ML IJ SOLN
0.5000 mg | Freq: Once | INTRAMUSCULAR | Status: AC
  Administered 2018-04-18: 0.5 mg via INTRAVENOUS
  Filled 2018-04-18: qty 1

## 2018-04-19 NOTE — Progress Notes (Signed)
Daily Progress Note   Patient Name: Sonya [name]       Date: 04-22-18 DOB: [birth year]  Age: 62 y.o. MRN#: [id number] Attending Physician: Laurin Coder, MD Primary Care Physician: Scot Jun, FNP Admit Date: 03/23/2018  Reason for Consultation/Follow-up: Establishing goals of care  Subjective:  Patient is sitting up in chair, how ever, she is not alert. She opens eyes, but doesn't verbalize. She is in moderate to severe resp distress, she is using accessory muscles of respiration Patient's sister is at the bedside See below I have discussed with patient's daughter Sonya [name] over the phone [phone].   Length of Stay: 3  Current Medications: Scheduled Meds:  . diltiazem  30 mg Oral Q8H  . ipratropium  0.5 mg Nebulization Q6H  . levalbuterol  0.63 mg Nebulization Q6H  . mouth rinse  15 mL Mouth Rinse BID  . methylPREDNISolone (SOLU-MEDROL) injection  40 mg Intravenous Q6H  . metoprolol tartrate  5 mg Intravenous Q6H    Continuous Infusions: . sodium chloride Stopped (04/17/18 1522)  . piperacillin-tazobactam (ZOSYN)  IV 3.375 g (04-22-2018 1414)    PRN Meds: LORazepam, metoprolol tartrate, morphine  Physical Exam         Frail weak In moderate to severe resp distress Coarse rhonchi Diminished breath sounds Not good inspiratory effort Tachy cardia Trace edema Not as alert Abdomen is not distended S 1 S 2   Vital Signs: BP 135/87 (BP Location: Right Arm)   Pulse (!) 120   Temp (!) 96.6 F (35.9 C) (Axillary) Comment: patient refused oral temp  Resp (!) 43   Ht 5\' 9"  (1.753 m)   Wt 44.9 kg   SpO2 98%   BMI 14.62 kg/m  SpO2: SpO2: 98 % O2 Device: O2 Device: High Flow Nasal Cannula O2 Flow Rate: O2 Flow Rate (L/min): 8 L/min  Intake/output  summary:   Intake/Output Summary (Last 24 hours) at 04-22-2018 1436 Last data filed at Apr 22, 2018 0400 Gross per 24 hour  Intake 336.11 ml  Output 425 ml  Net -88.89 ml   LBM:   Baseline Weight: Weight: 44.9 kg Most recent weight: Weight: 44.9 kg       Palliative Assessment/Data: PPS 30%   Flowsheet Rows     Most Recent Value  Intake Tab  Referral Department  Critical care  Unit at Time of Referral  ICU  Palliative Care Primary Diagnosis  Cancer  Date Notified  04/16/18  Palliative Care Type  Return patient Palliative Care  Reason for referral  Clarify Goals of Care, Non-pain Symptom  Date of Admission  04/11/2018  Date first seen by Palliative Care  04/16/18  # of days Palliative referral response time  0 Day(s)  # of days IP prior to Palliative referral  1  Clinical Assessment  Psychosocial & Spiritual Assessment  Palliative Care Outcomes      Patient Active Problem List   Diagnosis Date Noted  . Shortness of breath   . Palliative care by specialist   . Goals of care, counseling/discussion   . Hemoptysis 03/27/2018  . HCAP (healthcare-associated pneumonia)   . Acute respiratory distress 04/09/2018  . Acute on chronic respiratory failure with hypoxia ([place]) 04/09/2018  . Acute hypoxemic respiratory failure ([place]) 04/09/2018  . Non-small cell carcinoma of right lung, stage 3 ([place]) 04/02/2018  . Acute deep vein thrombosis (DVT) of axillary vein of left upper extremity ([place]) 03/25/2018  . Acute deep vein thrombosis (DVT) of axillary vein of right upper extremity ([place]) 03/25/2018  . Hypercoagulable state, secondary ([place]) 03/25/2018  . 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 Acute deep vein thrombosis (DVT) of upper extremity ([place]) 03/24/2018  . Tachycardia 03/20/2018  . New onset atrial fibrillation ([place]) 03/20/2018  . Atrial fibrillation with RVR ([place])   . COPD with acute exacerbation ([place])   . Hypertensive urgency 03/18/2018  . HTN (hypertension) 03/18/2018  .  Lung mass 03/18/2018  . 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 Pulmonary mass 03/17/2018  . Chronic anemia 03/17/2018  . Hypercalcemia 03/17/2018  . Tobacco abuse 06/20/2015  . 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 Post-op pneumonia Patient was following with Dr Tammi Klippel for continue Palliative radiation And patient was to be seen by Dr Earlie Server. As per chart review and med onc input, it is noted that the patient was to be considered for concurrent chemotherapy.  PMT has been following for ongoing goals of care discussion. Patient with ongoing functional decline, and decline in resp status.   Recommendations/Plan:   Paged by patient's bedside RN that the patient's family would like to elect for more comfort focused approach. I saw the patient, discussed with sister at bedside. Call placed and discussed with daughter Sonya [name]. She states that the patient has been suffering a lot, family would like to focus on keeping the patient comfortable, they don't want to see her suffer anymore.   We discussed about judicious use of opioids and benzodiazepines for comfort measures.   Family is all in agreement.   There is no established HCPOA agent, patient is divorced, patient's daughter [name] is the only number we have for adult children. Sonya [name] states that she will inform the patient's other 3 children of her deterioration.   Discussed in detail with Sonya [name], sister at bedside and also with bedside RN.   Med orders for Morphine IV PRN updated, also updated IV PRN Ativan orders.   Prognosis appears to be very limited, initiate comfort measures, as per family wishes.     Code Status:    Code Status Orders  (From admission, onward)         Start  Ordered   03/31/2018 2225  Do not attempt resuscitation (DNR)  Continuous      Question Answer Comment  In the event of cardiac or respiratory ARREST Do not call a "code blue"   In the event of cardiac or respiratory ARREST Do not perform Intubation, CPR, defibrillation or ACLS   In the event of cardiac or respiratory ARREST Use medication by any route, position, wound care, and other measures to relive pain and suffering. May use oxygen, suction and manual treatment of airway obstruction as needed for comfort.      04/14/2018 2226        Code Status History    Date Active Date Inactive Code Status Order ID Comments User Context   04/09/2018 0950 04/14/2018 2311 Full Code [id number]  Damita Lack, MD ED   03/17/2018 2101 03/28/2018 1434 Full Code [id number]  Shela Leff, MD ED       Prognosis:   < 2 weeks  Discharge Planning:  Anticipated Hospital Death vs transfer to residential hospice.   Care plan was discussed with  Patient, sister, daughter Sonya [name], Therapist, sports.   Thank you for allowing the Palliative Medicine Team to assist in the care of this patient.   Time In: 1400 Time Out: 1435 Total Time 35 Prolonged Time Billed  no       Greater than 50%  of this time was spent counseling and coordinating care related to the above assessment and plan.  Loistine Chance, MD [phone] Please contact Palliative Medicine Team phone at [phone] for questions and concerns.

## 2018-04-19 NOTE — Progress Notes (Signed)
NAME:  Sonya [name], MRN:  [id number], DOB:  06/10/1955, LOS: 3 ADMISSION DATE:  04/05/2018, CONSULTATION DATE: 04/02/2018 REFERRING MD: Emergency room physician, CHIEF COMPLAINT: Hemoptysis  Brief History   Stage III squamous cell lung cancer with SVC syndrome and severe tracheal narrowing secondary to the mass coming in with massive hemoptysis.   History of present illness   Sonya [name] is a 62 year old lady with history of squamous cell lung cancer of the right lung stage III at least with SVC syndrome, COPD on Eliquis for upper extremity thrombus coming in after 1 day of being discharged with massive hemoptysis which she describes as 1 cup.  Patient is a very poor historian not able to get much history from her but she stated that she has some shortness of breath denies any chest pain.  She has not had any more hemoptysis since she came into the emergency room as on 04/16/2018 at 10:30 AM  Patient is receiving fluids  Patient was just 11/26 after being admitted for acute hypoxemic respiratory failure at that time where she was started on oxygen and was sent home on Levaquin.  11/29-no recurrence of hemoptysis, remains short of breath 11/30-discussed with sister at bedside Palliative consult requested  Past Medical History  Atrial fibrillation ([place]), Bronchitis, Chronic back pain, COPD (chronic obstructive pulmonary disease) ([place]), HTN (hypertension), Lung cancer ([place]), Mass of upper lobe of right lung, and Scoliosis.  She was seen by Dr. Loanne Drilling, had bronchoscopy performed 03/27/2018 Had bronchoscopy performed revealing a diagnosis of cancer She has not followed up She also has not followed up with oncology  Was been seen by Dr. Domingo Cocking on 03/27/2018--palliative care  Attempts to get her to follow-up with oncology as documented-reviewed  Significant Hospital Events   Hemoptysis   Consults:  Palliative care 04/08/2018 Oncology 04/16/18 Procedures:   None  Significant Diagnostic Tests:  CT scan of the chest-reviewed by myself  1. No definite acute pulmonary embolus within the visible, patent pulmonary arteries. 2. Large necrotic right upper lobe lung mass consistent with history of lung cancer. Surrounding postobstructive atelectasis and or pneumonia in the right upper lobe and right middle lobe. Again noted is mediastinal invasion by mass lesion with marked narrowing of the distal trachea and carina and occlusion of the right upper and middle lobe bronchi. 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 Interim development of extensive collateral vessels within the mediastinum, left chest wall and paraspinal region, consistent with central venous obstruction. 3. Small right-sided pleural effusion. Stable left upper lobe pulmonary mass. Stable to slight decreased peribronchovascular nodularity in the right lower lobe  Micro Data:  none  Antimicrobials:  Zosyn 04/09/2018>> Vancomycin 04/04/2018>>11/28  Interim history/subjective:  Middle-aged lady, very frail, emaciated Not very interactive today  Objective   Blood pressure (!) 141/89, pulse 100, temperature (!) 96.6 F (35.9 C), temperature source Axillary, resp. rate (!) 34, height 5\' 9"  (1.753 m), weight 44.9 kg, SpO2 95 %.        Intake/Output Summary (Last 24 hours) at 04/23/2018 1216 Last data filed at 04/23/2018 0400 Gross per 24 hour  Intake 336.11 ml  Output 425 ml  Net -88.89 ml   Filed Weights   04/16/18 0340 04/17/18 0452 2018/04/23 0500  Weight: 44.9 kg 44.8 kg 44.9 kg    Examination: General: Middle-aged lady, Very frail HENT: Dry oral mucosa Lungs: Lateral rhonchi Cardiovascular: S1-S2 appreciated Abdomen: Bowel sounds appreciated Extremities: Extensive finger clubbing, upper extremity swelling Neuro: Moving all extremities  Resolved Hospital Problem list     Assessment & Plan:  Massive hemoptysis Hemoptysis is  stabilized Likely related to endotracheal site/endobronchial bleeding Significant risk of decompensation  Squamous cell lung cancer with SVC syndrome Very advanced disease Discontinued radiation treatments History of noncompliance  Postobstructive pneumonia We will continue Zosyn at present  Acute hypoxemic respiratory failure Continue oxygen supplementation  Sepsis Continue antibiotics  Malnutrition Orally as tolerated  Continue IV steroids  Atrial fibrillation On Cardizem Add IV Lopressor  She did discontinue radiation treatments Progressive disease  Palliative measures/palliative care is probably the best course of management  Patient is DNR risk of decompensation is very significant Palliative care consulted  Best practice:  Diet: As tolerated Pain/Anxiety/Delirium protocol (if indicated): Pain management DVT prophylaxis: Hold oral anticoagulants Mobility: Bedrest Code Status: DNR Family Communication: d/w sister at bedside Disposition:   Labs   CBC: Recent Labs  Lab 04/12/18 0550 04/13/18 0606 03/28/2018 1603 04/16/18 0342  WBC 14.0* 19.2* 19.6* 20.8*  NEUTROABS 12.8* 16.8* 18.6*  --   HGB 10.1* 10.8* 10.5* 10.1*  HCT 31.2* 34.8* 33.4* 32.9*  MCV 93.1 93.5 92.8 96.5  PLT 380 384 229 983    Basic Metabolic Panel: Recent Labs  Lab 04/12/18 0550 04/13/18 0606 03/22/2018 1650 04/16/18 0342  NA 134* 133* 132* 134*  K 4.9 4.8 5.0 5.1  CL 98 94* 92* 98  CO2 28 31 36* 29  GLUCOSE 130* 102* 123* 125*  BUN 19 20 27* 24*  CREATININE 0.51 0.56 0.46 0.53  CALCIUM 9.4 9.8 9.2 8.7*  MG 2.2  --   --   --   PHOS 2.8  --   --   --    GFR: Estimated Creatinine Clearance: 51.7 mL/min (by C-G formula based on SCr of 0.53 mg/dL). Recent Labs  Lab 04/12/18 0550 04/13/18 0606 04/13/2018 1603 04/16/18 0342  WBC 14.0* 19.2* 19.6* 20.8*    Liver Function Tests: Recent Labs  Lab 04/12/18 0550 04/06/2018 1650  AST 25 12*  ALT 22 18  ALKPHOS 61 59   BILITOT 0.4 0.7  PROT 7.1 7.1  ALBUMIN 2.7* 2.8*   No results for input(s): LIPASE, AMYLASE in the last 168 hours. No results for input(s): AMMONIA in the last 168 hours.  ABG    Component Value Date/Time   HCO3 33.3 (H) 04/12/2018 2149   TCO2 26 06/13/2015 1600   O2SAT 38.1 04/14/2018 2149     Coagulation Profile: Recent Labs  Lab 03/24/2018 1603  INR 1.69    Cardiac Enzymes: No results for input(s): CKTOTAL, CKMB, CKMBINDEX, TROPONINI in the last 168 hours.  HbA1C: No results found for: HGBA1C  CBG: Recent Labs  Lab 04/13/18 1625 04/13/18 2100 04/14/18 0731 04/14/18 1132 04/14/18 1652  GLUCAP 123* 120* 106* 118* 169*    Review of Systems:   Review of Systems  Unable to perform ROS: Medical condition  Constitutional: Negative.   HENT: Negative.   Eyes: Negative.   Respiratory: Positive for shortness of breath. Negative for cough.   Cardiovascular: Negative for chest pain.  Skin: Negative.   All other systems reviewed and are negative.    Past Medical History  She,  has a past medical history of Atrial fibrillation ([place]), Bronchitis, Chronic back pain, COPD (chronic obstructive pulmonary disease) ([place]), HTN (hypertension), Lung cancer ([place]), Mass of upper lobe of right lung, and Scoliosis.   Surgical History    Past Surgical History:  Procedure Laterality Date  . ENDOBRONCHIAL ULTRASOUND Bilateral 03/26/2018  Procedure: ENDOBRONCHIAL ULTRASOUND;  Surgeon: Margaretha Seeds, MD;  Location: [facility];  Service: Pulmonary;  Laterality: Bilateral;     Social History   reports that she quit smoking about 6 weeks ago. Her smoking use included cigarettes. She has a 10.00 pack-year smoking history. She has never used smokeless tobacco. She reports that she drank alcohol. She reports that she does not use drugs.   Family History   Her family history includes Cancer in her brother, brother, and sister; Heart disease in her brother.   Allergies No Known  Allergies   Home Medications  Prior to Admission medications   Medication Sig Start Date End Date Taking? Authorizing Provider  albuterol (PROVENTIL HFA;VENTOLIN HFA) 108 (90 Base) MCG/ACT inhaler Inhale 2 puffs into the lungs every 6 (six) hours as needed for wheezing or shortness of breath. 04/02/18  Yes Scot Jun, FNP  apixaban (ELIQUIS) 5 MG TABS tablet Take 1 tablet (5 mg total) by mouth 2 (two) times daily. 04/03/18  Yes Charlynne Cousins, MD  feeding supplement, ENSURE ENLIVE, (ENSURE ENLIVE) LIQD Take 237 mLs by mouth 2 (two) times daily between meals. 03/28/18  Yes Charlynne Cousins, MD  oxyCODONE-acetaminophen (PERCOCET/ROXICET) 5-325 MG tablet Take 1 tablet by mouth every 8 (eight) hours as needed for severe pain. Patient taking differently: Take 2 tablets by mouth every 8 (eight) hours as needed for severe pain.  12/04/17  Yes McDonald, Mia A, PA-C  albuterol (PROVENTIL) (2.5 MG/3ML) 0.083% nebulizer solution Take 3 mLs (2.5 mg total) by nebulization every 6 (six) hours as needed for wheezing or shortness of breath. 04/02/18   Scot Jun, FNP  diltiazem (CARDIZEM CD) 240 MG 24 hr capsule Take 1 capsule (240 mg total) by mouth daily. 04/14/18   Mikhail, Velta Addison, DO  ipratropium (ATROVENT) 0.02 % nebulizer solution Take 2.5 mLs (0.5 mg total) by nebulization 4 (four) times daily. 04/02/18   Scot Jun, FNP  levofloxacin (LEVAQUIN) 500 MG tablet Take 1 tablet (500 mg total) by mouth daily for 5 days. 04/13/18 Apr 29, 2018  Mikhail, Velta Addison, DO  ondansetron (ZOFRAN) 4 MG tablet Take 1 tablet (4 mg total) by mouth every 6 (six) hours as needed for nausea. 04/13/18   Mikhail, Velta Addison, DO  predniSONE (DELTASONE) 20 MG tablet Take in the morning. Take 3 tabs x 3 days, then 2 tabs x 3 days, then 1 tab x 3 days. 04/13/18   Cristal Ford, DO

## 2018-04-19 NOTE — Progress Notes (Signed)
Pt. Refusing to change her bed pad even though it is solid. Educated the patient on skin breakdown and infection. Stating "do not change it." will continue to try to provide hygiene for patient.

## 2018-04-19 NOTE — Progress Notes (Signed)
Morphine IV gtt wasted 32mL in stericycle, witnessed by Maryjane Hurter, RN.

## 2018-04-19 DEATH — deceased

## 2018-04-20 ENCOUNTER — Encounter: Payer: Self-pay | Admitting: Radiation Oncology

## 2018-04-20 ENCOUNTER — Ambulatory Visit: Payer: Medicaid Other

## 2018-04-20 NOTE — Progress Notes (Signed)
Informed Dr. Tammi Klippel, Shona Simpson, PA-C, and [place], RT on L3 that patient passed over the weekend. Candace, RT committed to dropping an EOT and closing out the patient's chart.

## 2018-04-21 ENCOUNTER — Ambulatory Visit: Payer: Medicaid Other

## 2018-04-21 ENCOUNTER — Telehealth: Payer: Self-pay | Admitting: Radiation Oncology

## 2018-04-21 LAB — CULTURE, BLOOD (ROUTINE X 2)
Culture: NO GROWTH
Culture: NO GROWTH
SPECIAL REQUESTS: ADEQUATE
SPECIAL REQUESTS: ADEQUATE

## 2018-04-21 NOTE — Telephone Encounter (Signed)
Received voicemail message from patient's niece requesting a note to submit to her work proving her aunt passed away so she can get a grant/money to cremate her. Explained the only notes I can provide are radiation oncology ones. She verbalized understanding.  She plans to pick these notes later today or tomorrow.

## 2018-04-22 ENCOUNTER — Ambulatory Visit: Payer: Medicaid Other

## 2018-04-22 ENCOUNTER — Telehealth: Payer: Self-pay

## 2018-04-22 NOTE — Telephone Encounter (Signed)
On 04/22/18 I received a dc/ from [facility] (original). DC is for cremation.  Patient is a patient of Doctor Olalre.  DC will be taken to Pulmonary Unit for signature.

## 2018-04-23 ENCOUNTER — Ambulatory Visit: Payer: Medicaid Other

## 2018-04-24 ENCOUNTER — Ambulatory Visit: Payer: Medicaid Other

## 2018-04-26 ENCOUNTER — Ambulatory Visit: Payer: Medicaid Other

## 2018-04-27 ENCOUNTER — Ambulatory Visit: Payer: Medicaid Other

## 2018-04-28 ENCOUNTER — Ambulatory Visit: Payer: Medicaid Other

## 2018-04-28 ENCOUNTER — Telehealth: Payer: Self-pay | Admitting: Pulmonary Disease

## 2018-04-28 NOTE — Telephone Encounter (Signed)
04/28/18 Received Signed DC from Dr. Ander Slade, Called [place] spoke to [place] let her know DC ready for pickup.

## 2018-04-29 ENCOUNTER — Ambulatory Visit: Payer: Medicaid Other

## 2018-04-30 ENCOUNTER — Ambulatory Visit: Payer: Medicaid Other

## 2018-04-30 NOTE — Progress Notes (Signed)
  Radiation Oncology         [phone]) [phone] ________________________________  Name: Sonya [name] MRN: [id number]  Date: 04/20/2018  DOB: August 08, 1955  End of Treatment Note  Diagnosis:   Stage IV NSCLC, squamous cell carcinoma of the right upper lobe  Indication for treatment::   Curative, Chemo-Radiotherapy        Radiation treatment dates:   04/06/2018 - 04/13/2018  Site/planned dose:   The primary tumor and involved mediastinal adenopathy were planned to receive 66 Gy in 33 fractions of 2 Gy.  Beams/energy:   A five field 3D conformal treatment arrangement was used delivering 6 and 10 MV photons.  Daily image-guidance CT was used to align the treatment with the targeted volume  Narrative: The patient's status declined during her course of radiation, and her radiotherapy was discontinued after 7 treatments.  Plan: The patient died after discontinuing radiation. ________________________________   Tyler Pita, MD [place] Director and Director of Stereotactic Radiosurgery Direct Dial: [phone]  Fax: [phone] [email or web address]  Skype  LinkedIn  This document serves as a record of services personally performed by Tyler Pita, MD. It was created on his behalf by Rae Lips, a trained medical scribe. The creation of this record is based on the scribe's personal observations and the provider's statements to them. This document has been checked and approved by the attending provider.

## 2018-05-01 ENCOUNTER — Ambulatory Visit: Payer: Medicaid Other

## 2018-05-04 ENCOUNTER — Ambulatory Visit: Payer: Medicaid Other

## 2018-05-04 NOTE — Telephone Encounter (Signed)
Encounter error

## 2018-05-05 ENCOUNTER — Ambulatory Visit: Payer: Medicaid Other

## 2018-05-06 ENCOUNTER — Ambulatory Visit: Payer: Medicaid Other

## 2018-05-07 ENCOUNTER — Ambulatory Visit: Payer: Medicaid Other

## 2018-05-08 ENCOUNTER — Ambulatory Visit: Payer: Medicaid Other

## 2018-05-11 ENCOUNTER — Ambulatory Visit: Payer: Medicaid Other

## 2018-05-12 ENCOUNTER — Ambulatory Visit: Payer: Medicaid Other

## 2018-05-14 ENCOUNTER — Ambulatory Visit: Payer: Medicaid Other

## 2018-05-15 ENCOUNTER — Ambulatory Visit: Payer: Medicaid Other

## 2018-05-18 ENCOUNTER — Ambulatory Visit: Payer: Medicaid Other

## 2018-05-19 ENCOUNTER — Ambulatory Visit: Payer: Medicaid Other

## 2018-05-20 NOTE — Discharge Summary (Signed)
Physician Discharge Summary  Patient ID: Sonya [name] MRN: [id number] DOB/AGE: 1955/06/13 63 y.o.  Admit date: 04-26-2018 Date of death: 2018/04/29   Final Diagnoses:  Acute on chronic respiratory failure Stage III squamous cell lung cancer Superior vena cava syndrome Hemoptysis Chronic obstructive pulmonary disease  Other diagnoses Hypertension Chronic back pain Atrial fibrillation  Death summary Patient was admitted on 2018/04/26 with massive hemoptysis which occurred at home, this is secondary to a stage III squamous cell lung cancer with SVC syndrome, extensive tracheal narrowing secondary to the mass lesion. She had recently been discharged the day previously for acute on chronic respiratory failure She had no further hemoptysis following admission. Stabilized hemodynamically, started on antibiotics for possible postobstructive pneumonia, anticoagulants were held. Patient's respiratory status did not improve despite ongoing care, invasive intervention was not appropriate. Oncology consultation requested Patient recently stopped radiation treatments Palliative care was consulted for assistance with end-of-life issues Patient was made comfort measures  Patient expired on 28/83/3744 from complications of stage III squamous cell lung cancer.  Signed: Sherrilyn Rist, MD [facility]

## 2018-05-21 ENCOUNTER — Ambulatory Visit: Payer: Medicaid Other

## 2018-05-22 ENCOUNTER — Ambulatory Visit: Payer: Medicaid Other

## 2018-05-25 ENCOUNTER — Ambulatory Visit: Payer: Medicaid Other

## 2018-05-26 ENCOUNTER — Ambulatory Visit: Payer: Medicaid Other

## 2018-05-27 ENCOUNTER — Ambulatory Visit: Payer: Medicaid Other

## 2019-12-12 IMAGING — DX DG CHEST 1V PORT
1 series · 1 of 1 positions shown · non-contrast
Comparison: 03/17/2018 CT and CXR

CLINICAL DATA: Productive cough with clear mucus. Smoker since age
22. Lung mass.

EXAM:
PORTABLE CHEST 1 VIEW

[chest ap]
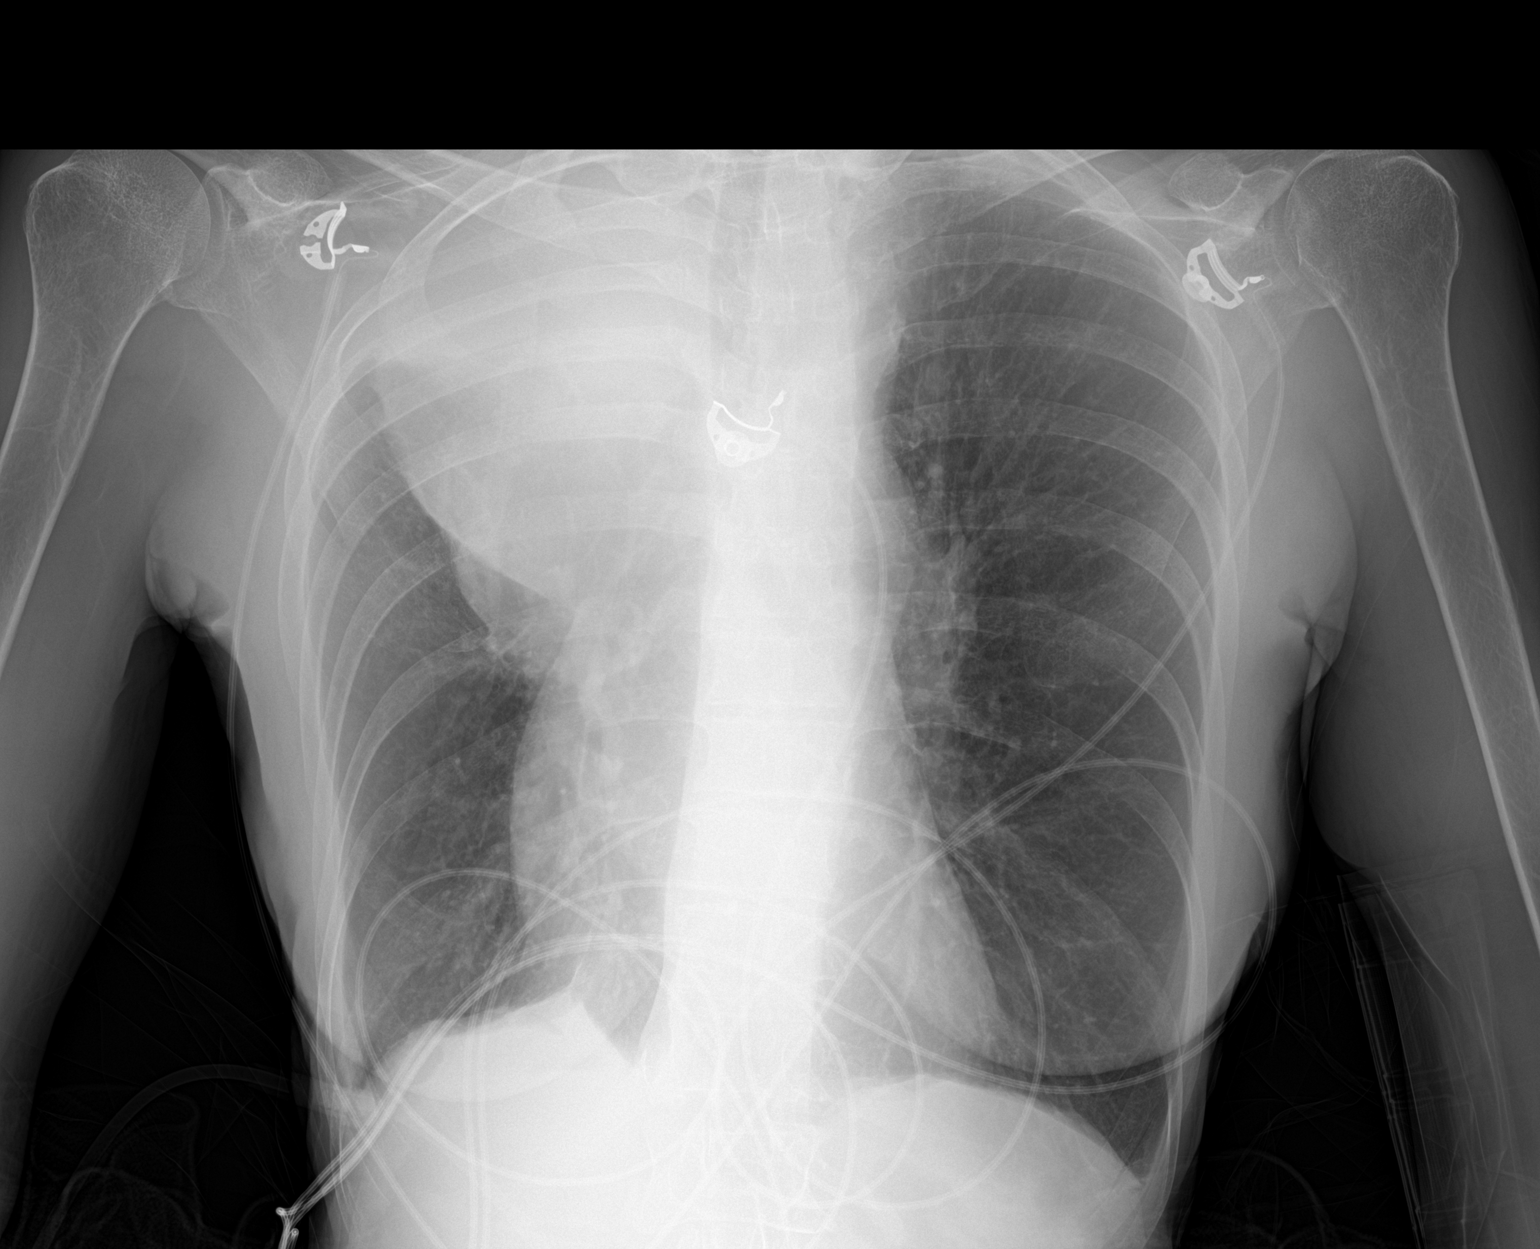

[1 of 1 positions shown; findings below may reference images not displayed]

FINDINGS: Unchanged pulmonary mass occupying much of the right upper thorax.
Emphysematous hyperinflation of. Heart and mediastinal contours to
the extent visualized appear stable. No aggressive osseous lesions.
IMPRESSION: Redemonstration of right upper pulmonary mass in the setting of
COPD. No significant change.

## 2019-12-12 IMAGING — MR MR HEAD W/O CM
8 of 10 series · 37 of 48 positions shown · IV contrast (agent unspecified)
Comparison: CT HEAD June 13, 2015

CLINICAL DATA: Status post lung mass biopsy, lung cancer.

EXAM:
MRI HEAD WITHOUT CONTRAST
TECHNIQUE: Multiplanar, multiecho pulse sequences of the brain and surrounding
structures were obtained without intravenous contrast. Due to cough
and motion, patient was unable to tolerate further imaging and
postcontrast images not obtained.

[Series 3: DWI · axial · 3.0mm · 1.09mm/px · z∈[-36,+115]mm · 8 of 104 slices shown (1 of 4)]
[im 1/104]
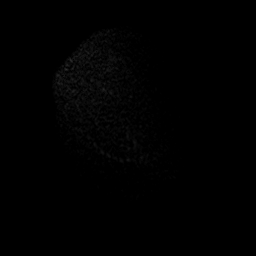
[im 12/104]
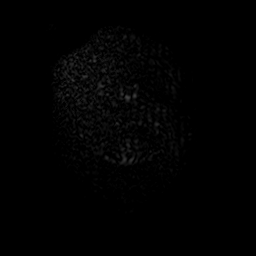
[im 35/104]
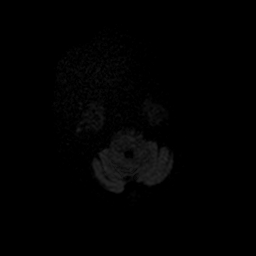
[im 46/104]
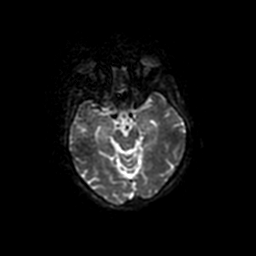
[im 58/104]
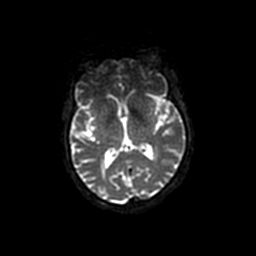
[im 69/104]
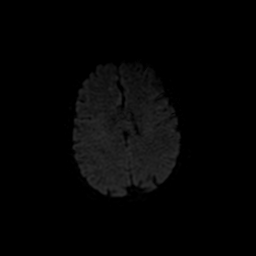
[im 92/104]
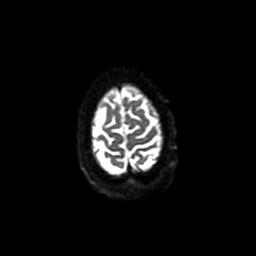
[im 104/104]
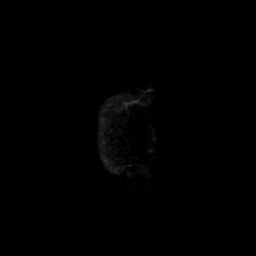

[Series 4: T1 · sagittal · 5.0mm · 0.47mm/px · 2 of 24 slices shown]
[im 1/24]
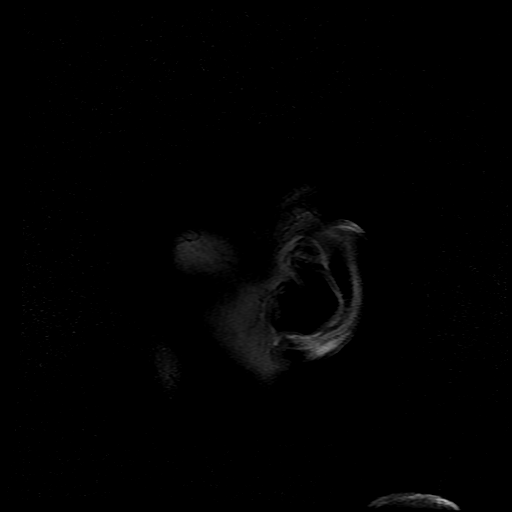
[im 12/24]
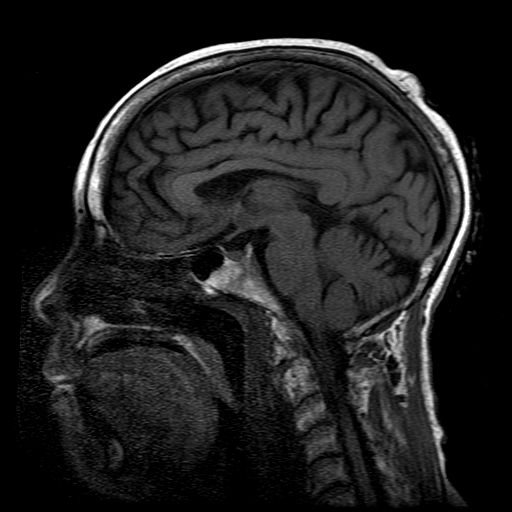

[Series 5: DWI · coronal · 4.0mm · 1.09mm/px · 8 of 70 slices shown (2 of 4)]
[im 1/70]
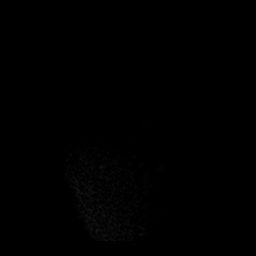
[im 10/70]
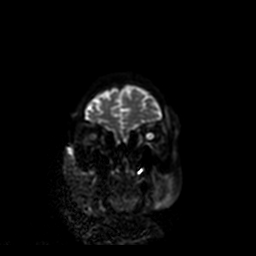
[im 20/70]
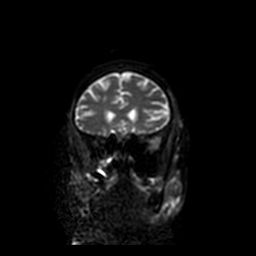
[im 30/70]
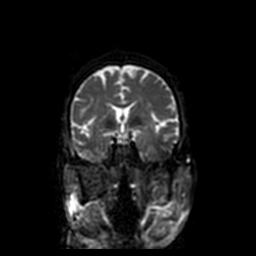
[im 40/70]
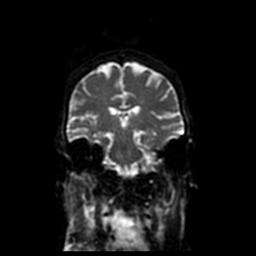
[im 50/70]
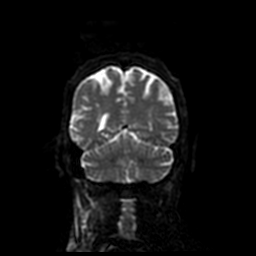
[im 60/70]
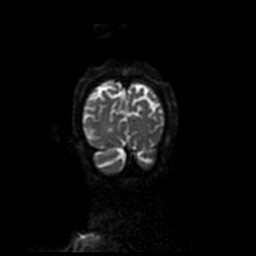
[im 70/70]
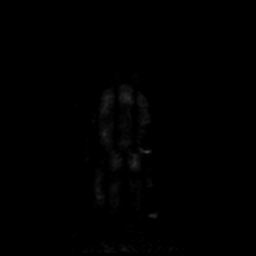

[Series 6: T2 · axial · 5.0mm · 0.43mm/px · z∈[-25,+124]mm · 3 of 26 slices shown (1 of 2)]
[im 1/26]
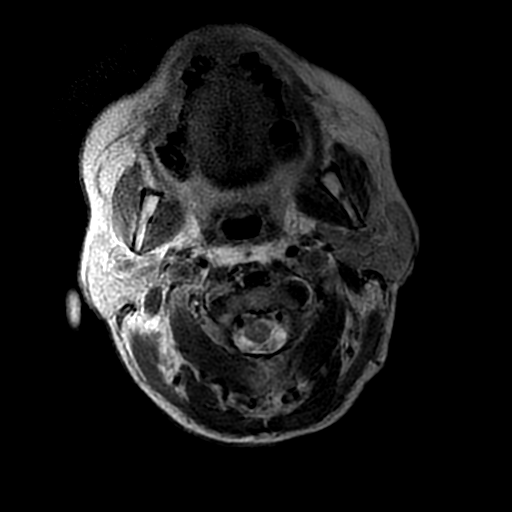
[im 13/26]
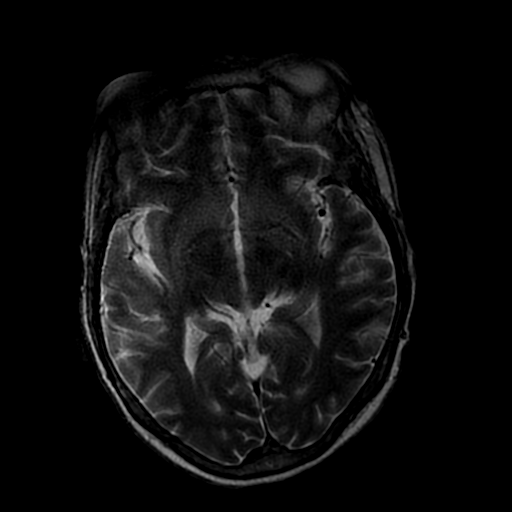
[im 26/26]
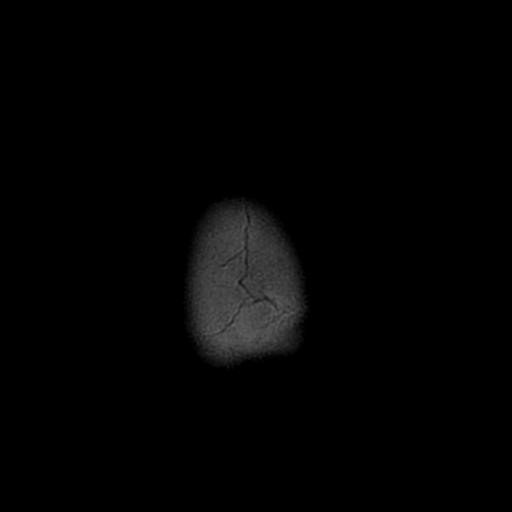

[Series 7: FLAIR · axial · 5.0mm · 0.43mm/px · z∈[-25,+124]mm · 3 of 26 slices shown]
[im 1/26]
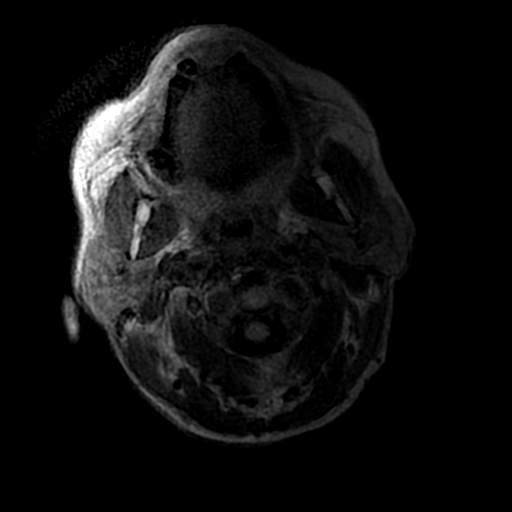
[im 13/26]
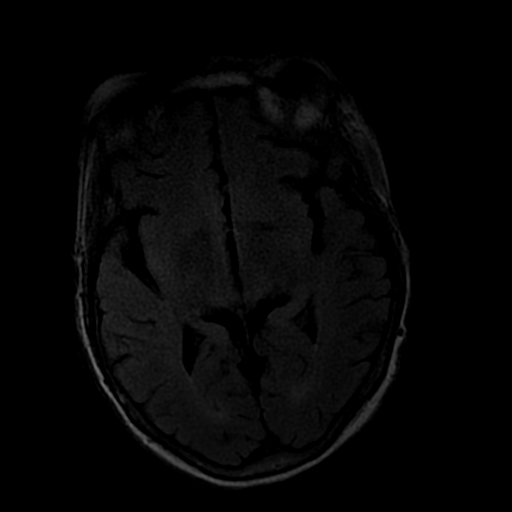
[im 26/26]
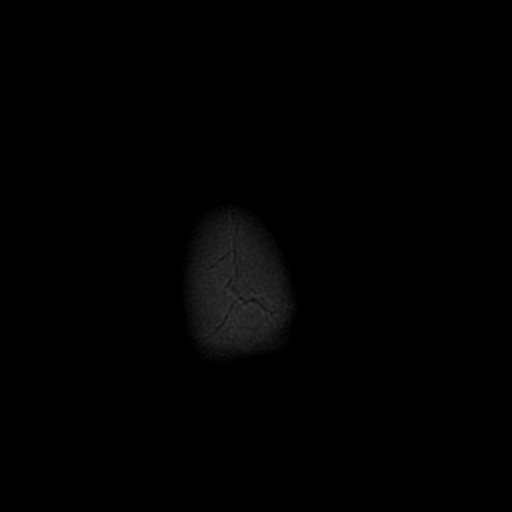

[Series 10: T2 · coronal · 5.0mm · 0.45mm/px · 3 of 27 slices shown (2 of 2)]
[im 1/27]
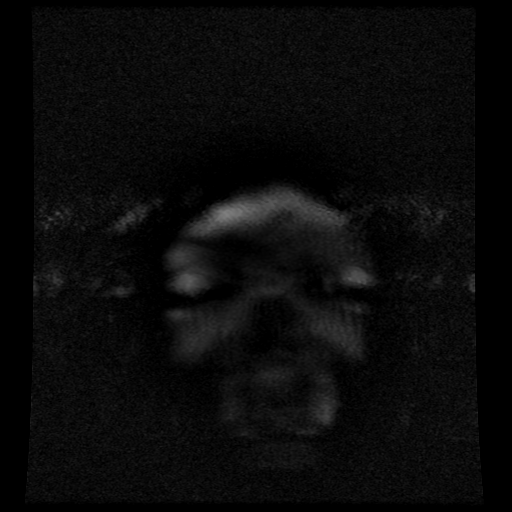
[im 14/27]
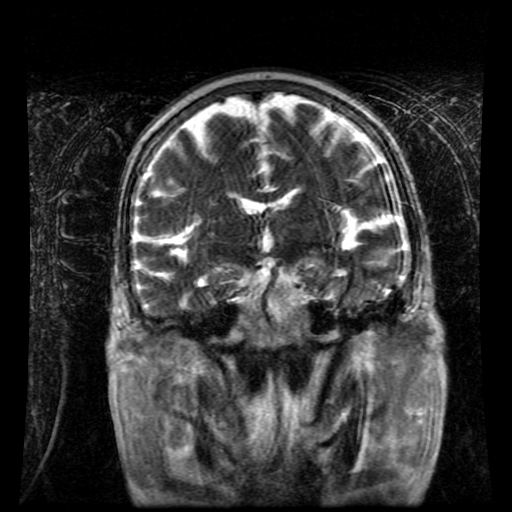
[im 27/27]
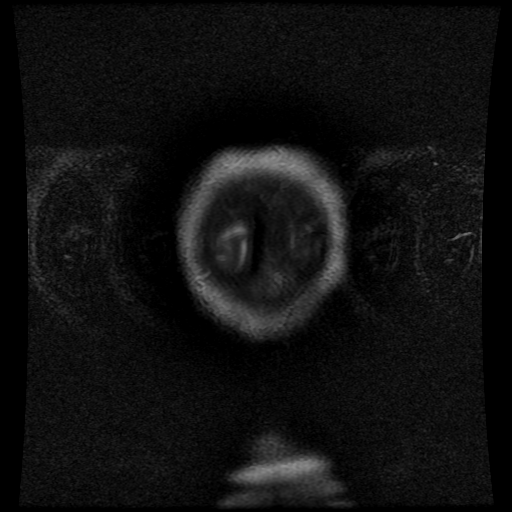

[Series 300: DWI · axial · 3.0mm · 1.09mm/px · z∈[-36,+115]mm · 6 of 52 slices shown (3 of 4)]
[im 1/52]
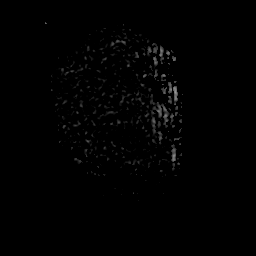
[im 11/52]
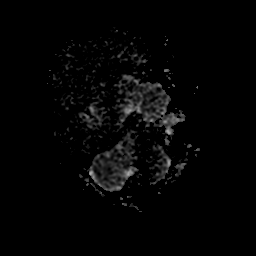
[im 21/52]
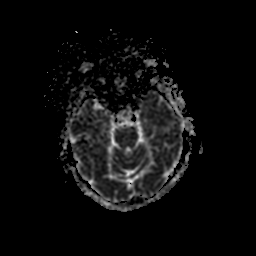
[im 31/52]
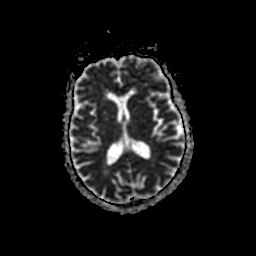
[im 41/52]
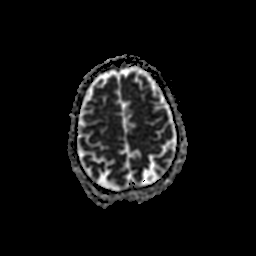
[im 52/52]
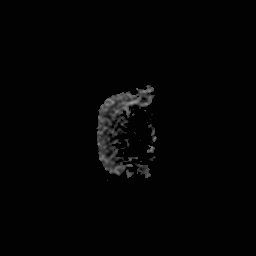

[Series 500: DWI · coronal · 4.0mm · 1.09mm/px · 4 of 35 slices shown (4 of 4)]
[im 1/35]
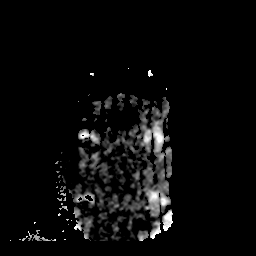
[im 12/35]
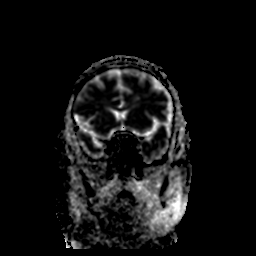
[im 23/35]
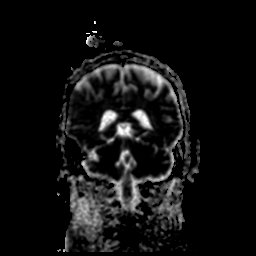
[im 35/35]
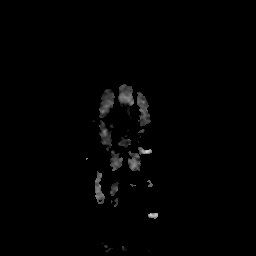

[37 of 48 positions shown; findings below may reference images not displayed]

FINDINGS: Variable motion degraded examination, severely motion degraded axial
MPGR and axial T1.

INTRACRANIAL CONTENTS: No reduced diffusion to suggest acute
ischemia or hypercellular tumor. No susceptibility artifact to
suggest hemorrhage though sequence is severely motion degraded. The
ventricles and sulci are normal for patient's age. Patchy
supratentorial and pontine white matter FLAIR T2 hyperintensities.
No suspicious parenchymal signal, masses, mass effect. No abnormal
extra-axial fluid collections. No extra-axial masses.

VASCULAR: Normal major intracranial vascular flow voids present at
skull base.

SKULL AND UPPER CERVICAL SPINE: No abnormal sellar expansion. No
suspicious calvarial bone marrow signal. Craniocervical junction
maintained.

SINUSES/ORBITS: Mild sphenoid sinus mucosal thickening. Mastoid air
cells are well aerated. Included ocular globes and orbital contents
are non-suspicious.

OTHER: None.
IMPRESSION: 1. Motion degraded examination without acute intracranial process.
No identified intracranial metastasis though postcontrast sequences
would be more sensitive.
2. Mild-to-moderate chronic small vessel ischemic changes.
